# Patient Record
Sex: Male | Born: 1960
Health system: Southern US, Community
[De-identification: ages and names within clinical notes are randomized; demographics above are authoritative.]

## PROBLEM LIST (undated history)

## (undated) DIAGNOSIS — G8929 Other chronic pain: Secondary | ICD-10-CM

## (undated) DIAGNOSIS — M549 Dorsalgia, unspecified: Secondary | ICD-10-CM

## (undated) DIAGNOSIS — C649 Malignant neoplasm of unspecified kidney, except renal pelvis: Secondary | ICD-10-CM

## (undated) HISTORY — PX: HERNIA REPAIR: SHX51

## (undated) HISTORY — PX: NEPHRECTOMY: SHX65

## (undated) HISTORY — PX: HAND SURGERY: SHX662

---

## 2010-10-23 ENCOUNTER — Emergency Department (HOSPITAL_BASED_OUTPATIENT_CLINIC_OR_DEPARTMENT_OTHER)
Admission: EM | Admit: 2010-10-23 | Discharge: 2010-10-23 | Disposition: A | Discharge status: Home / Self Care | Payer: Managed Care, Other (non HMO) | Attending: Emergency Medicine | Admitting: Emergency Medicine

## 2010-10-23 ENCOUNTER — Emergency Department (INDEPENDENT_AMBULATORY_CARE_PROVIDER_SITE_OTHER): Payer: Managed Care, Other (non HMO)

## 2010-10-23 DIAGNOSIS — R1011 Right upper quadrant pain: Secondary | ICD-10-CM | POA: Insufficient documentation

## 2010-10-23 DIAGNOSIS — R109 Unspecified abdominal pain: Secondary | ICD-10-CM

## 2010-10-23 LAB — CBC
HCT: 40.2 % (ref 39.0–52.0)
Hemoglobin: 13.7 g/dL (ref 13.0–17.0)
MCH: 29.8 pg (ref 26.0–34.0)
MCHC: 34.1 g/dL (ref 30.0–36.0)
MCV: 87.4 fL (ref 78.0–100.0)
Platelets: 229 10*3/uL (ref 150–400)
RBC: 4.6 MIL/uL (ref 4.22–5.81)
RDW: 12.1 % (ref 11.5–15.5)
WBC: 7.7 10*3/uL (ref 4.0–10.5)

## 2010-10-23 LAB — DIFFERENTIAL
Basophils Absolute: 0 10*3/uL (ref 0.0–0.1)
Basophils Relative: 0 % (ref 0–1)
Eosinophils Absolute: 0.1 10*3/uL (ref 0.0–0.7)
Eosinophils Relative: 1 % (ref 0–5)
Lymphocytes Relative: 25 % (ref 12–46)
Lymphs Abs: 1.9 10*3/uL (ref 0.7–4.0)
Monocytes Absolute: 0.7 10*3/uL (ref 0.1–1.0)
Monocytes Relative: 9 % (ref 3–12)
Neutro Abs: 5 10*3/uL (ref 1.7–7.7)
Neutrophils Relative %: 65 % (ref 43–77)

## 2010-10-23 LAB — URINALYSIS, ROUTINE W REFLEX MICROSCOPIC
Bilirubin Urine: NEGATIVE
Hgb urine dipstick: NEGATIVE
Ketones, ur: NEGATIVE mg/dL
Nitrite: NEGATIVE
Protein, ur: NEGATIVE mg/dL
Specific Gravity, Urine: 1.027 (ref 1.005–1.030)
Urine Glucose, Fasting: NEGATIVE mg/dL
Urobilinogen, UA: 1 mg/dL (ref 0.0–1.0)
pH: 5.5 (ref 5.0–8.0)

## 2010-10-23 LAB — COMPREHENSIVE METABOLIC PANEL
ALT: 21 U/L (ref 0–53)
AST: 21 U/L (ref 0–37)
Albumin: 4.2 g/dL (ref 3.5–5.2)
Alkaline Phosphatase: 105 U/L (ref 39–117)
BUN: 16 mg/dL (ref 6–23)
CO2: 23 mEq/L (ref 19–32)
Calcium: 9.4 mg/dL (ref 8.4–10.5)
Chloride: 112 mEq/L (ref 96–112)
Creatinine, Ser: 1.3 mg/dL (ref 0.4–1.5)
GFR calc Af Amer: >60 mL/min (ref >60)
GFR calc non Af Amer: 58 mL/min — ABNORMAL LOW (ref >60)
Glucose, Bld: 91 mg/dL (ref 70–99)
Potassium: 4.8 mEq/L (ref 3.5–5.1)
Sodium: 145 mEq/L (ref 135–145)
Total Bilirubin: 0.8 mg/dL (ref 0.3–1.2)
Total Protein: 7.6 g/dL (ref 6.0–8.3)

## 2010-10-23 LAB — LIPASE, BLOOD: Lipase: 554 U/L — ABNORMAL HIGH (ref 23–300)

## 2010-10-23 MED ORDER — IOHEXOL 300 MG/ML  SOLN
100.0000 mL | Freq: Once | INTRAMUSCULAR | Status: AC | PRN
  Administered 2010-10-23: 100 mL via INTRAVENOUS

## 2010-10-25 LAB — URINE CULTURE
Colony Count: NO GROWTH
Culture  Setup Time: 201202122012
Culture: NO GROWTH

## 2010-12-17 ENCOUNTER — Emergency Department (HOSPITAL_BASED_OUTPATIENT_CLINIC_OR_DEPARTMENT_OTHER)
Admission: EM | Admit: 2010-12-17 | Discharge: 2010-12-17 | Disposition: A | Discharge status: Home / Self Care | Payer: Managed Care, Other (non HMO) | Attending: Emergency Medicine | Admitting: Emergency Medicine

## 2010-12-17 DIAGNOSIS — R51 Headache: Secondary | ICD-10-CM | POA: Insufficient documentation

## 2010-12-17 DIAGNOSIS — G8929 Other chronic pain: Secondary | ICD-10-CM | POA: Insufficient documentation

## 2010-12-24 ENCOUNTER — Emergency Department (HOSPITAL_COMMUNITY)
Admission: EM | Admit: 2010-12-24 | Discharge: 2010-12-24 | Disposition: A | Discharge status: Home / Self Care | Payer: Managed Care, Other (non HMO) | Attending: Emergency Medicine | Admitting: Emergency Medicine

## 2010-12-24 ENCOUNTER — Emergency Department (HOSPITAL_COMMUNITY): Payer: Managed Care, Other (non HMO)

## 2010-12-24 DIAGNOSIS — Z85528 Personal history of other malignant neoplasm of kidney: Secondary | ICD-10-CM | POA: Insufficient documentation

## 2010-12-24 DIAGNOSIS — R0989 Other specified symptoms and signs involving the circulatory and respiratory systems: Secondary | ICD-10-CM | POA: Insufficient documentation

## 2010-12-24 DIAGNOSIS — R071 Chest pain on breathing: Secondary | ICD-10-CM | POA: Insufficient documentation

## 2010-12-24 DIAGNOSIS — R0609 Other forms of dyspnea: Secondary | ICD-10-CM | POA: Insufficient documentation

## 2010-12-24 DIAGNOSIS — R05 Cough: Secondary | ICD-10-CM | POA: Insufficient documentation

## 2010-12-24 DIAGNOSIS — R51 Headache: Secondary | ICD-10-CM | POA: Insufficient documentation

## 2010-12-24 DIAGNOSIS — R059 Cough, unspecified: Secondary | ICD-10-CM | POA: Insufficient documentation

## 2010-12-24 LAB — BASIC METABOLIC PANEL
BUN: 17 mg/dL (ref 6–23)
CO2: 25 mEq/L (ref 19–32)
Calcium: 8.4 mg/dL (ref 8.4–10.5)
Chloride: 106 mEq/L (ref 96–112)
Creatinine, Ser: 1.24 mg/dL (ref 0.4–1.5)
GFR calc Af Amer: >60 mL/min (ref >60)
GFR calc non Af Amer: >60 mL/min (ref >60)
Glucose, Bld: 135 mg/dL — ABNORMAL HIGH (ref 70–99)
Potassium: 3.8 mEq/L (ref 3.5–5.1)
Sodium: 138 mEq/L (ref 135–145)

## 2010-12-24 LAB — CBC
HCT: 38.5 % — ABNORMAL LOW (ref 39.0–52.0)
Hemoglobin: 12.8 g/dL — ABNORMAL LOW (ref 13.0–17.0)
MCH: 30 pg (ref 26.0–34.0)
MCHC: 33.2 g/dL (ref 30.0–36.0)
MCV: 90.4 fL (ref 78.0–100.0)
Platelets: 209 10*3/uL (ref 150–400)
RBC: 4.26 MIL/uL (ref 4.22–5.81)
RDW: 12.9 % (ref 11.5–15.5)
WBC: 7.8 10*3/uL (ref 4.0–10.5)

## 2010-12-24 LAB — DIFFERENTIAL
Basophils Absolute: 0 10*3/uL (ref 0.0–0.1)
Basophils Relative: 0 % (ref 0–1)
Eosinophils Absolute: 0.2 10*3/uL (ref 0.0–0.7)
Eosinophils Relative: 2 % (ref 0–5)
Lymphocytes Relative: 34 % (ref 12–46)
Lymphs Abs: 2.7 10*3/uL (ref 0.7–4.0)
Monocytes Absolute: 0.7 10*3/uL (ref 0.1–1.0)
Monocytes Relative: 9 % (ref 3–12)
Neutro Abs: 4.2 10*3/uL (ref 1.7–7.7)
Neutrophils Relative %: 54 % (ref 43–77)

## 2010-12-24 LAB — POCT CARDIAC MARKERS
CKMB, poc: <1 ng/mL — ABNORMAL LOW (ref 1.0–8.0)
CKMB, poc: <1 ng/mL — ABNORMAL LOW (ref 1.0–8.0)
Myoglobin, poc: 49.3 ng/mL (ref 12–200)
Myoglobin, poc: 61.5 ng/mL (ref 12–200)
Troponin i, poc: <0.05 ng/mL (ref 0.00–0.09)
Troponin i, poc: <0.05 ng/mL (ref 0.00–0.09)

## 2010-12-24 LAB — D-DIMER, QUANTITATIVE: D-Dimer, Quant: 0.26 ug/mL-FEU (ref 0.00–0.48)

## 2011-01-21 ENCOUNTER — Emergency Department (HOSPITAL_BASED_OUTPATIENT_CLINIC_OR_DEPARTMENT_OTHER)
Admission: EM | Admit: 2011-01-21 | Discharge: 2011-01-21 | Disposition: A | Discharge status: Home / Self Care | Payer: Managed Care, Other (non HMO) | Attending: Emergency Medicine | Admitting: Emergency Medicine

## 2011-01-21 ENCOUNTER — Emergency Department (INDEPENDENT_AMBULATORY_CARE_PROVIDER_SITE_OTHER): Payer: Managed Care, Other (non HMO)

## 2011-01-21 DIAGNOSIS — F17200 Nicotine dependence, unspecified, uncomplicated: Secondary | ICD-10-CM | POA: Insufficient documentation

## 2011-01-21 DIAGNOSIS — M25529 Pain in unspecified elbow: Secondary | ICD-10-CM

## 2011-01-21 DIAGNOSIS — M25469 Effusion, unspecified knee: Secondary | ICD-10-CM | POA: Insufficient documentation

## 2011-01-21 DIAGNOSIS — M25429 Effusion, unspecified elbow: Secondary | ICD-10-CM

## 2011-01-21 DIAGNOSIS — Z85528 Personal history of other malignant neoplasm of kidney: Secondary | ICD-10-CM

## 2011-02-26 ENCOUNTER — Emergency Department (HOSPITAL_BASED_OUTPATIENT_CLINIC_OR_DEPARTMENT_OTHER)
Admission: EM | Admit: 2011-02-26 | Discharge: 2011-02-26 | Disposition: A | Discharge status: Home / Self Care | Payer: Managed Care, Other (non HMO) | Attending: Emergency Medicine | Admitting: Emergency Medicine

## 2011-02-26 DIAGNOSIS — F17200 Nicotine dependence, unspecified, uncomplicated: Secondary | ICD-10-CM | POA: Insufficient documentation

## 2011-02-26 DIAGNOSIS — M6580 Other synovitis and tenosynovitis, unspecified site: Secondary | ICD-10-CM | POA: Insufficient documentation

## 2011-02-26 DIAGNOSIS — M25529 Pain in unspecified elbow: Secondary | ICD-10-CM | POA: Insufficient documentation

## 2011-03-26 ENCOUNTER — Encounter: Payer: Self-pay | Admitting: *Deleted

## 2011-03-26 ENCOUNTER — Emergency Department (HOSPITAL_BASED_OUTPATIENT_CLINIC_OR_DEPARTMENT_OTHER)
Admission: EM | Admit: 2011-03-26 | Discharge: 2011-03-26 | Disposition: A | Discharge status: Home / Self Care | Payer: Worker's Compensation | Attending: Emergency Medicine | Admitting: Emergency Medicine

## 2011-03-26 DIAGNOSIS — M545 Low back pain, unspecified: Secondary | ICD-10-CM | POA: Insufficient documentation

## 2011-03-26 DIAGNOSIS — F17200 Nicotine dependence, unspecified, uncomplicated: Secondary | ICD-10-CM | POA: Insufficient documentation

## 2011-03-26 DIAGNOSIS — X500XXA Overexertion from strenuous movement or load, initial encounter: Secondary | ICD-10-CM | POA: Insufficient documentation

## 2011-03-26 DIAGNOSIS — S39012A Strain of muscle, fascia and tendon of lower back, initial encounter: Secondary | ICD-10-CM

## 2011-03-26 DIAGNOSIS — R269 Unspecified abnormalities of gait and mobility: Secondary | ICD-10-CM | POA: Insufficient documentation

## 2011-03-26 DIAGNOSIS — Y990 Civilian activity done for income or pay: Secondary | ICD-10-CM | POA: Insufficient documentation

## 2011-03-26 DIAGNOSIS — Z79899 Other long term (current) drug therapy: Secondary | ICD-10-CM | POA: Insufficient documentation

## 2011-03-26 DIAGNOSIS — S335XXA Sprain of ligaments of lumbar spine, initial encounter: Secondary | ICD-10-CM | POA: Insufficient documentation

## 2011-03-26 HISTORY — DX: Malignant neoplasm of unspecified kidney, except renal pelvis: C64.9

## 2011-03-26 MED ORDER — OXYCODONE-ACETAMINOPHEN 5-325 MG PO TABS
2.0000 | ORAL_TABLET | Freq: Once | ORAL | Status: AC
  Administered 2011-03-26: 2 via ORAL

## 2011-03-26 MED ORDER — CYCLOBENZAPRINE HCL 10 MG PO TABS
10.0000 mg | ORAL_TABLET | Freq: Once | ORAL | Status: AC
  Administered 2011-03-26: 10 mg via ORAL

## 2011-03-26 MED ORDER — OXYCODONE-ACETAMINOPHEN 5-325 MG PO TABS
ORAL_TABLET | ORAL | Status: AC
  Filled 2011-03-26: qty 1

## 2011-03-26 MED ORDER — CYCLOBENZAPRINE HCL 10 MG PO TABS: 10.0000 mg | ORAL_TABLET | Freq: Two times a day (BID) | ORAL | Status: AC | PRN

## 2011-03-26 MED ORDER — OXYCODONE-ACETAMINOPHEN 5-325 MG PO TABS: 1.0000 | ORAL_TABLET | ORAL | Status: AC | PRN

## 2011-03-26 MED ORDER — CYCLOBENZAPRINE HCL 10 MG PO TABS
ORAL_TABLET | ORAL | Status: AC
  Filled 2011-03-26: qty 1

## 2011-03-26 MED ORDER — IBUPROFEN 600 MG PO TABS: 600.0000 mg | ORAL_TABLET | Freq: Four times a day (QID) | ORAL | Status: AC | PRN

## 2011-03-26 NOTE — ED Provider Notes (Signed)
History     Chief Complaint  Patient presents with  . Back Pain   HPI Comments: [facility] and [facility] and has been taking oxycodone for this. [facility] in [facility] one week for recheck.  Patient is a 50 y.o. male presenting with back pain. The history is provided by the patient.  Back Pain  This is a recurrent problem. 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 The problem occurs constantly. The problem has not changed since onset.Associated with: Heavy lifting at work. The pain is present in the lumbar spine. The quality of the pain is described as stabbing. The pain does not radiate. The pain is moderate. The symptoms are aggravated by bending and certain positions. The pain is the same all the time. Pertinent negatives include no chest pain, no fever, no numbness, no headaches, no bladder incontinence, no dysuria, no paresthesias, no paresis, no tingling and no weakness. He has tried nothing for the symptoms.    Past Medical History  Diagnosis Date  . Kidney carcinoma     Past Surgical History  Procedure Date  . Nephrectomy   . Hernia repair     History reviewed. No pertinent family history.  History  Substance Use Topics  . Smoking status: Current Some Day Smoker -- 0.5 packs/day  . Smokeless tobacco: Not on file  . Alcohol Use: No      Review of Systems  Constitutional: Negative for fever and chills.  HENT: Negative for neck pain.   Cardiovascular: Negative for chest pain.  Gastrointestinal: Negative for nausea, vomiting and diarrhea.  Genitourinary: Negative for bladder incontinence, dysuria and difficulty urinating.  Musculoskeletal: Positive for back pain.  Skin: Negative for rash.  Neurological: Negative for tingling, weakness, numbness, headaches and paresthesias.    Physical Exam  BP 128/78  Pulse 90   Temp(Src) 97.4 F (36.3 C) (Oral)  Resp 16  SpO2 100%  Physical Exam  Constitutional: He appears well-developed and well-nourished.  HENT:  Head: Normocephalic and atraumatic.  Eyes: Conjunctivae are normal. No scleral icterus.  Cardiovascular: Normal rate, regular rhythm and normal heart sounds.   No murmur heard. Pulmonary/Chest: Effort normal and breath sounds normal. No respiratory distress.  Abdominal: Soft.  Musculoskeletal: Normal range of motion. He exhibits no edema and no tenderness.       Mild central lumbar pain, no sacral pain, no thoracic pain.  Neurological: He is alert.       Normal strength and sensation of the bilateral lower extremities. Gait antalgic due to back pain.  Skin: Skin is warm and dry. No rash noted. He is not diaphoretic.    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 We'll give pain medication here and refer back to his primary care doctor for ongoing symptoms and treatment. He is neurologically intact at this time.      Vida Roller, MD 03/26/11 [phone]

## 2011-03-26 NOTE — ED Notes (Signed)
Pt c/o lower back pain x 2 weeks 

## 2011-03-27 ENCOUNTER — Encounter (HOSPITAL_BASED_OUTPATIENT_CLINIC_OR_DEPARTMENT_OTHER): Payer: Self-pay | Admitting: *Deleted

## 2011-03-27 ENCOUNTER — Emergency Department (HOSPITAL_BASED_OUTPATIENT_CLINIC_OR_DEPARTMENT_OTHER)
Admission: EM | Admit: 2011-03-27 | Discharge: 2011-03-27 | Disposition: A | Discharge status: Home / Self Care | Payer: Managed Care, Other (non HMO) | Attending: Emergency Medicine | Admitting: Emergency Medicine

## 2011-03-27 DIAGNOSIS — M549 Dorsalgia, unspecified: Secondary | ICD-10-CM

## 2011-03-27 NOTE — ED Provider Notes (Signed)
History     Chief Complaint  Patient presents with  . Back Pain   HPI Comments: Pt had hurt back several days ago - by lifting heavy objects - has improved with percocet and motrin and now only feels the pain when he has been sitting for a prolonged period of time and then tries to get up - it gets better with mvoement and loosening up.  Denies neuro or urinary sx.  States he is ready to go back to work.  Patient is a 50 y.o. male presenting with back pain. The history is provided by the patient and medical records.  Back Pain  Pertinent negatives include no fever.    Past Medical History  Diagnosis Date  . Kidney carcinoma     Past Surgical History  Procedure Date  . Nephrectomy   . Hernia repair     No family history on file.  History  Substance Use Topics  . Smoking status: Current Some Day Smoker -- 0.5 packs/day  . Smokeless tobacco: Not on file  . Alcohol Use: No      Review of Systems  Constitutional: Negative for fever.  Gastrointestinal: Negative for nausea and vomiting.  Musculoskeletal: Positive for back pain.    Physical Exam  BP 143/86  Pulse 66  Temp(Src) 98 F (36.7 C) (Oral)  Resp 20  Ht 5\' 9"  (1.753 m)  Wt 177 lb (80.287 kg)  BMI 26.14 kg/m2  SpO2 100%  Physical Exam  Constitutional: He appears well-developed and well-nourished. No distress.  HENT:  Head: Normocephalic and atraumatic.  Eyes: Conjunctivae are normal. No scleral icterus.  Musculoskeletal:       Entire spine non tender  Neurological:       Gait normal, motor and sensory normal  Skin: Skin is warm and dry. No rash noted. He is not diaphoretic.    ED Course  Procedures  MDM Well appaering, improving - will send back to work.      Vida Roller, MD 03/27/11 [phone]

## 2011-03-27 NOTE — ED Notes (Signed)
Pt requesting a note to return to work tomorrow and a non-narcotic pain medication.

## 2011-03-27 NOTE — ED Notes (Addendum)
Pt says that he was injured at work on 01/24/11, having pain in his back. Evaluated here for the same. Pt says his pain is not much better, would like rx for non narcotic medication and a release note back to work

## 2011-04-04 ENCOUNTER — Emergency Department (HOSPITAL_COMMUNITY): Payer: Managed Care, Other (non HMO)

## 2011-04-04 ENCOUNTER — Emergency Department (HOSPITAL_COMMUNITY)
Admission: EM | Admit: 2011-04-04 | Discharge: 2011-04-04 | Disposition: A | Discharge status: Home / Self Care | Payer: Managed Care, Other (non HMO) | Attending: Emergency Medicine | Admitting: Emergency Medicine

## 2011-04-04 DIAGNOSIS — X500XXA Overexertion from strenuous movement or load, initial encounter: Secondary | ICD-10-CM | POA: Insufficient documentation

## 2011-04-04 DIAGNOSIS — N289 Disorder of kidney and ureter, unspecified: Secondary | ICD-10-CM | POA: Insufficient documentation

## 2011-04-04 DIAGNOSIS — IMO0002 Reserved for concepts with insufficient information to code with codable children: Secondary | ICD-10-CM | POA: Insufficient documentation

## 2011-04-04 DIAGNOSIS — M549 Dorsalgia, unspecified: Secondary | ICD-10-CM | POA: Insufficient documentation

## 2011-04-04 LAB — CBC
HCT: 43.2 % (ref 39.0–52.0)
Hemoglobin: 14.9 g/dL (ref 13.0–17.0)
MCH: 30.9 pg (ref 26.0–34.0)
MCHC: 34.5 g/dL (ref 30.0–36.0)
MCV: 89.6 fL (ref 78.0–100.0)
Platelets: 228 10*3/uL (ref 150–400)
RBC: 4.82 MIL/uL (ref 4.22–5.81)
RDW: 12.6 % (ref 11.5–15.5)
WBC: 9.7 10*3/uL (ref 4.0–10.5)

## 2011-04-04 LAB — DIFFERENTIAL
Basophils Absolute: 0 10*3/uL (ref 0.0–0.1)
Basophils Relative: 0 % (ref 0–1)
Eosinophils Absolute: 0.2 10*3/uL (ref 0.0–0.7)
Eosinophils Relative: 2 % (ref 0–5)
Lymphocytes Relative: 22 % (ref 12–46)
Lymphs Abs: 2.1 10*3/uL (ref 0.7–4.0)
Monocytes Absolute: 0.7 10*3/uL (ref 0.1–1.0)
Monocytes Relative: 7 % (ref 3–12)
Neutro Abs: 6.7 10*3/uL (ref 1.7–7.7)
Neutrophils Relative %: 69 % (ref 43–77)

## 2011-04-04 LAB — COMPREHENSIVE METABOLIC PANEL
ALT: 26 U/L (ref 0–53)
AST: 29 U/L (ref 0–37)
Albumin: 4.6 g/dL (ref 3.5–5.2)
Alkaline Phosphatase: 115 U/L (ref 39–117)
BUN: 17 mg/dL (ref 6–23)
CO2: 27 mEq/L (ref 19–32)
Calcium: 10 mg/dL (ref 8.4–10.5)
Chloride: 102 mEq/L (ref 96–112)
Creatinine, Ser: 1.5 mg/dL — ABNORMAL HIGH (ref 0.50–1.35)
GFR calc Af Amer: 60 mL/min — ABNORMAL LOW (ref >60)
GFR calc non Af Amer: 50 mL/min — ABNORMAL LOW (ref >60)
Glucose, Bld: 131 mg/dL — ABNORMAL HIGH (ref 70–99)
Potassium: 3.9 mEq/L (ref 3.5–5.1)
Sodium: 138 mEq/L (ref 135–145)
Total Bilirubin: 0.3 mg/dL (ref 0.3–1.2)
Total Protein: 8.3 g/dL (ref 6.0–8.3)

## 2011-04-04 LAB — URINALYSIS, ROUTINE W REFLEX MICROSCOPIC
Glucose, UA: NEGATIVE mg/dL
Hgb urine dipstick: NEGATIVE
Ketones, ur: NEGATIVE mg/dL
Leukocytes, UA: NEGATIVE
Nitrite: NEGATIVE
Protein, ur: NEGATIVE mg/dL
Specific Gravity, Urine: 1.04 — ABNORMAL HIGH (ref 1.005–1.030)
Urobilinogen, UA: 1 mg/dL (ref 0.0–1.0)
pH: 5.5 (ref 5.0–8.0)

## 2011-04-18 ENCOUNTER — Emergency Department (INDEPENDENT_AMBULATORY_CARE_PROVIDER_SITE_OTHER): Payer: Managed Care, Other (non HMO)

## 2011-04-18 ENCOUNTER — Encounter (HOSPITAL_BASED_OUTPATIENT_CLINIC_OR_DEPARTMENT_OTHER): Payer: Self-pay | Admitting: *Deleted

## 2011-04-18 ENCOUNTER — Emergency Department (HOSPITAL_BASED_OUTPATIENT_CLINIC_OR_DEPARTMENT_OTHER)
Admission: EM | Admit: 2011-04-18 | Discharge: 2011-04-18 | Disposition: A | Discharge status: Home / Self Care | Payer: Managed Care, Other (non HMO) | Attending: Emergency Medicine | Admitting: Emergency Medicine

## 2011-04-18 DIAGNOSIS — N289 Disorder of kidney and ureter, unspecified: Secondary | ICD-10-CM | POA: Insufficient documentation

## 2011-04-18 DIAGNOSIS — R1031 Right lower quadrant pain: Secondary | ICD-10-CM

## 2011-04-18 DIAGNOSIS — R109 Unspecified abdominal pain: Secondary | ICD-10-CM

## 2011-04-18 DIAGNOSIS — Z905 Acquired absence of kidney: Secondary | ICD-10-CM

## 2011-04-18 DIAGNOSIS — M545 Low back pain, unspecified: Secondary | ICD-10-CM | POA: Insufficient documentation

## 2011-04-18 LAB — COMPREHENSIVE METABOLIC PANEL
ALT: 14 U/L (ref 0–53)
AST: 20 U/L (ref 0–37)
Albumin: 4.3 g/dL (ref 3.5–5.2)
Alkaline Phosphatase: 118 U/L — ABNORMAL HIGH (ref 39–117)
BUN: 25 mg/dL — ABNORMAL HIGH (ref 6–23)
CO2: 24 mEq/L (ref 19–32)
Calcium: 10 mg/dL (ref 8.4–10.5)
Chloride: 102 mEq/L (ref 96–112)
Creatinine, Ser: 1.4 mg/dL — ABNORMAL HIGH (ref 0.50–1.35)
GFR calc Af Amer: >60 mL/min (ref >60)
GFR calc non Af Amer: 54 mL/min — ABNORMAL LOW (ref >60)
Glucose, Bld: 101 mg/dL — ABNORMAL HIGH (ref 70–99)
Potassium: 4.1 mEq/L (ref 3.5–5.1)
Sodium: 139 mEq/L (ref 135–145)
Total Bilirubin: 0.2 mg/dL — ABNORMAL LOW (ref 0.3–1.2)
Total Protein: 8 g/dL (ref 6.0–8.3)

## 2011-04-18 LAB — CBC
HCT: 43.4 % (ref 39.0–52.0)
Hemoglobin: 14.8 g/dL (ref 13.0–17.0)
MCH: 30.3 pg (ref 26.0–34.0)
MCHC: 34.1 g/dL (ref 30.0–36.0)
MCV: 88.8 fL (ref 78.0–100.0)
Platelets: 231 10*3/uL (ref 150–400)
RBC: 4.89 MIL/uL (ref 4.22–5.81)
RDW: 12.5 % (ref 11.5–15.5)
WBC: 9.4 10*3/uL (ref 4.0–10.5)

## 2011-04-18 LAB — DIFFERENTIAL
Basophils Absolute: 0 10*3/uL (ref 0.0–0.1)
Basophils Relative: 0 % (ref 0–1)
Eosinophils Absolute: 0.2 10*3/uL (ref 0.0–0.7)
Eosinophils Relative: 2 % (ref 0–5)
Lymphocytes Relative: 29 % (ref 12–46)
Lymphs Abs: 2.7 10*3/uL (ref 0.7–4.0)
Monocytes Absolute: 0.7 10*3/uL (ref 0.1–1.0)
Monocytes Relative: 8 % (ref 3–12)
Neutro Abs: 5.8 10*3/uL (ref 1.7–7.7)
Neutrophils Relative %: 62 % (ref 43–77)

## 2011-04-18 LAB — URINALYSIS, ROUTINE W REFLEX MICROSCOPIC
Bilirubin Urine: NEGATIVE
Glucose, UA: NEGATIVE mg/dL
Hgb urine dipstick: NEGATIVE
Ketones, ur: NEGATIVE mg/dL
Leukocytes, UA: NEGATIVE
Nitrite: NEGATIVE
Protein, ur: NEGATIVE mg/dL
Specific Gravity, Urine: 1.027 (ref 1.005–1.030)
Urobilinogen, UA: 0.2 mg/dL (ref 0.0–1.0)
pH: 5.5 (ref 5.0–8.0)

## 2011-04-18 LAB — LIPASE, BLOOD: Lipase: 64 U/L — ABNORMAL HIGH (ref 11–59)

## 2011-04-18 MED ORDER — HYDROMORPHONE HCL 2 MG PO TABS: 2.0000 mg | ORAL_TABLET | ORAL | Status: AC | PRN

## 2011-04-18 MED ORDER — HYDROMORPHONE HCL 1 MG/ML IJ SOLN
1.0000 mg | Freq: Once | INTRAMUSCULAR | Status: AC
  Administered 2011-04-18: 1 mg via INTRAVENOUS
  Filled 2011-04-18: qty 1

## 2011-04-18 MED ORDER — ONDANSETRON HCL 4 MG/2ML IJ SOLN
4.0000 mg | Freq: Once | INTRAMUSCULAR | Status: AC
  Administered 2011-04-18: 4 mg via INTRAVENOUS
  Filled 2011-04-18: qty 2

## 2011-04-18 MED ORDER — SODIUM CHLORIDE 0.9 % IV SOLN
999.0000 mL | INTRAVENOUS | Status: DC
  Administered 2011-04-18: 999 mL via INTRAVENOUS

## 2011-04-18 NOTE — ED Notes (Signed)
Left flank pain has ct appt 17th states is in severe pain unable to wait that long has had right kidney removed due to cancer previously

## 2011-04-18 NOTE — ED Provider Notes (Addendum)
History     CSN: [id number] Arrival date & time: 04/18/2011  5:07 PM  Chief Complaint  Patient presents with  . Flank Pain   Patient is a 50 y.o. male presenting with flank pain.  Flank Pain This is a new problem. The current episode started more than 1 week ago (he has been having pain in his right flank and RLQ for over one month). The problem has been gradually worsening (pain is currently 8/10, but has been as severe as 10/10). Associated symptoms comments: There is associated nausea and anorexia as well as intermittent dysuria. No fever, chills, sweats. No constipation or diarrhea. Symptoms are described as severe. Nothing makes it better, nothing makes it worse.. Treatments tried: He has taken Hydrocodone-Acetaminophen with no relief.    Past Medical History  Diagnosis Date  . Kidney carcinoma     Past Surgical History  Procedure Date  . Nephrectomy   . Hernia repair     History reviewed. No pertinent family history.  History  Substance Use Topics  . Smoking status: Current Some Day Smoker -- 0.5 packs/day  . Smokeless tobacco: Not on file  . Alcohol Use: No      Review of Systems  Genitourinary: Positive for flank pain.  All other systems reviewed and are negative.    Physical Exam  BP 132/78  Pulse 63  Temp(Src) 97.9 F (36.6 C) (Oral)  Resp 20  SpO2 100%  Physical Exam  Nursing note and vitals reviewed. Constitutional: He is oriented to person, place, and time. He appears well-developed and well-nourished. He appears distressed.       In moderate pain.  HENT:  Head: Normocephalic and atraumatic.  Right Ear: External ear normal.  Left Ear: External ear normal.  Nose: Nose normal.  Mouth/Throat: Oropharynx is clear and moist.  Eyes: Conjunctivae and EOM are normal. Pupils are equal, round, and reactive to light. No scleral icterus.  Neck: Normal range of motion. Neck supple. No JVD present.  Cardiovascular: Normal rate, regular rhythm, normal heart  sounds and intact distal pulses.   No murmur heard. Pulmonary/Chest: Effort normal and breath sounds normal. He has no wheezes. He has no rales.  Abdominal: Soft. He exhibits no mass. There is tenderness.       Moderate right CVA and RLQ tenderness. Peristalsis is diminished.  Musculoskeletal: Normal range of motion. He exhibits no edema.  Lymphadenopathy:    He has no cervical adenopathy.  Neurological: He is alert and oriented to person, place, and time. Coordination normal.  Skin: Skin is warm and dry. No rash noted.  Psychiatric: He has a normal mood and affect.    ED Course  Procedures  MDM Right flank pain in patient who has had a prior right nephrectomy. Prior ED visits reviewed - visits for musculoskeletal back pain.  Good pain relief from IV Dilaudid. Labs and x-rays reviewed, images viewed by me. No clear causes for pain. Degree of renal insufficiency in line with patient with one kidney. Results for orders placed during the hospital encounter of 04/18/11  URINALYSIS, ROUTINE W REFLEX MICROSCOPIC      Component Value Range   Color, Urine YELLOW  YELLOW    Appearance CLEAR  CLEAR    Specific Gravity, Urine 1.027  1.005 - 1.030    pH 5.5  5.0 - 8.0    Glucose, UA NEGATIVE  NEGATIVE (mg/dL)   Hgb urine dipstick NEGATIVE  NEGATIVE    Bilirubin Urine NEGATIVE  NEGATIVE  Ketones, ur NEGATIVE  NEGATIVE (mg/dL)   Protein, ur NEGATIVE  NEGATIVE (mg/dL)   Urobilinogen, UA 0.2  0.0 - 1.0 (mg/dL)   Nitrite NEGATIVE  NEGATIVE    Leukocytes, UA NEGATIVE  NEGATIVE   CBC      Component Value Range   WBC 9.4  4.0 - 10.5 (K/uL)   RBC 4.89  4.22 - 5.81 (MIL/uL)   Hemoglobin 14.8  13.0 - 17.0 (g/dL)   HCT 16.1  09.6 - 04.5 (%)   MCV 88.8  78.0 - 100.0 (fL)   MCH 30.3  26.0 - 34.0 (pg)   MCHC 34.1  30.0 - 36.0 (g/dL)   RDW 40.9  81.1 - 91.4 (%)   Platelets 231  150 - 400 (K/uL)  DIFFERENTIAL      Component Value Range   Neutrophils Relative 62  43 - 77 (%)   Neutro Abs 5.8   1.7 - 7.7 (K/uL)   Lymphocytes Relative 29  12 - 46 (%)   Lymphs Abs 2.7  0.7 - 4.0 (K/uL)   Monocytes Relative 8  3 - 12 (%)   Monocytes Absolute 0.7  0.1 - 1.0 (K/uL)   Eosinophils Relative 2  0 - 5 (%)   Eosinophils Absolute 0.2  0.0 - 0.7 (K/uL)   Basophils Relative 0  0 - 1 (%)   Basophils Absolute 0.0  0.0 - 0.1 (K/uL)  COMPREHENSIVE METABOLIC PANEL      Component Value Range   Sodium 139  135 - 145 (mEq/L)   Potassium 4.1  3.5 - 5.1 (mEq/L)   Chloride 102  96 - 112 (mEq/L)   CO2 24  19 - 32 (mEq/L)   Glucose, Bld 101 (*) 70 - 99 (mg/dL)   BUN 25 (*) 6 - 23 (mg/dL)   Creatinine, Ser 7.82 (*) 0.50 - 1.35 (mg/dL)   Calcium 95.6  8.4 - 10.5 (mg/dL)   Total Protein 8.0  6.0 - 8.3 (g/dL)   Albumin 4.3  3.5 - 5.2 (g/dL)   AST 20  0 - 37 (U/L)   ALT 14  0 - 53 (U/L)   Alkaline Phosphatase 118 (*) 39 - 117 (U/L)   Total Bilirubin 0.2 (*) 0.3 - 1.2 (mg/dL)   GFR calc non Af Amer 54 (*) >60 (mL/min)   GFR calc Af Amer >60  >60 (mL/min)  LIPASE, BLOOD      Component Value Range   Lipase 64 (*) 11 - 59 (U/L)   Ct Abdomen Pelvis Wo Contrast  04/18/2011  *RADIOLOGY REPORT*  Clinical Data: Right lower quadrant/right flank pain, status post right nephrectomy  CT ABDOMEN AND PELVIS WITHOUT CONTRAST  Technique:  Multidetector CT imaging of the abdomen and pelvis was performed following the standard protocol without intravenous contrast.  Comparison: CT dated 10/23/2010  Findings: Lung bases are clear.  Unenhanced liver, spleen, and adrenal glands are within normal limits.  Pancreas is unremarkable, without convincing inflammatory changes.  Gallbladder is unremarkable.  No intrahepatic or extrahepatic ductal dilatation.  Status post right nephrectomy.  No abnormal soft tissue in the surgical bed.  The kidney is unremarkable.  No renal calculi or hydronephrosis.  No evidence of bowel obstruction.  Normal appendix.  No evidence of abdominal aortic aneurysm.  No abdominopelvic ascites.  2.0 x 1.4 cm  celiac axis lymph node (series 2/image 16), unlikely to be related to patient's prior renal malignancy.  Otherwise, no suspicious abdominopelvic lymphadenopathy.  Prostate is unremarkable.  No left ureteral or bladder  calculi.  Bladder is underdistended but unremarkable.  Degenerative changes of the visualized thoracolumbar spine.  No focal osseous lesions.  IMPRESSION: No acute findings in the abdomen/pelvis.  Status post right nephrectomy.  No evidence of recurrent or metastatic disease.  Original Report Authenticated By: Charline Bills, M.D.   Dg Lumbar Spine Complete  04/04/2011  *RADIOLOGY REPORT*  Clinical Data: Low back pain  LUMBAR SPINE - COMPLETE 4+ VIEW  Comparison: 10/23/2010  Findings: Sclerotic change in the right femoral head with a stable small lucent   subchondral lesion. There is no evidence of lumbar spine fracture.  Alignment is normal.  Intervertebral disc spaces are maintained.  IMPRESSION: 1.  Negative lumbar spine.  Original Report Authenticated By: Osa Craver, M.D.      Dione Booze, MD 04/18/11 Ebony Cargo  Dione Booze, MD 04/18/11 [phone]

## 2011-06-30 ENCOUNTER — Emergency Department (INDEPENDENT_AMBULATORY_CARE_PROVIDER_SITE_OTHER): Payer: Managed Care, Other (non HMO)

## 2011-06-30 ENCOUNTER — Emergency Department (HOSPITAL_BASED_OUTPATIENT_CLINIC_OR_DEPARTMENT_OTHER)
Admission: EM | Admit: 2011-06-30 | Discharge: 2011-06-30 | Disposition: A | Discharge status: Home / Self Care | Payer: Managed Care, Other (non HMO) | Attending: Emergency Medicine | Admitting: Emergency Medicine

## 2011-06-30 ENCOUNTER — Encounter (HOSPITAL_BASED_OUTPATIENT_CLINIC_OR_DEPARTMENT_OTHER): Payer: Self-pay | Admitting: *Deleted

## 2011-06-30 DIAGNOSIS — R1031 Right lower quadrant pain: Secondary | ICD-10-CM

## 2011-06-30 DIAGNOSIS — R197 Diarrhea, unspecified: Secondary | ICD-10-CM | POA: Insufficient documentation

## 2011-06-30 DIAGNOSIS — F17200 Nicotine dependence, unspecified, uncomplicated: Secondary | ICD-10-CM | POA: Insufficient documentation

## 2011-06-30 DIAGNOSIS — R109 Unspecified abdominal pain: Secondary | ICD-10-CM | POA: Insufficient documentation

## 2011-06-30 LAB — COMPREHENSIVE METABOLIC PANEL
ALT: 14 U/L (ref 0–53)
AST: 23 U/L (ref 0–37)
Albumin: 4.4 g/dL (ref 3.5–5.2)
Alkaline Phosphatase: 111 U/L (ref 39–117)
BUN: 16 mg/dL (ref 6–23)
CO2: 23 mEq/L (ref 19–32)
Calcium: 10 mg/dL (ref 8.4–10.5)
Chloride: 103 mEq/L (ref 96–112)
Creatinine, Ser: 1.1 mg/dL (ref 0.50–1.35)
GFR calc Af Amer: 89 mL/min — ABNORMAL LOW (ref >90)
GFR calc non Af Amer: 77 mL/min — ABNORMAL LOW (ref >90)
Glucose, Bld: 89 mg/dL (ref 70–99)
Potassium: 4.5 mEq/L (ref 3.5–5.1)
Sodium: 137 mEq/L (ref 135–145)
Total Bilirubin: 0.6 mg/dL (ref 0.3–1.2)
Total Protein: 7.9 g/dL (ref 6.0–8.3)

## 2011-06-30 LAB — CBC
HCT: 43.8 % (ref 39.0–52.0)
Hemoglobin: 14.9 g/dL (ref 13.0–17.0)
MCH: 29.6 pg (ref 26.0–34.0)
MCHC: 34 g/dL (ref 30.0–36.0)
MCV: 86.9 fL (ref 78.0–100.0)
Platelets: 247 10*3/uL (ref 150–400)
RBC: 5.04 MIL/uL (ref 4.22–5.81)
RDW: 12.4 % (ref 11.5–15.5)
WBC: 10.5 10*3/uL (ref 4.0–10.5)

## 2011-06-30 LAB — DIFFERENTIAL
Basophils Absolute: 0 10*3/uL (ref 0.0–0.1)
Basophils Relative: 0 % (ref 0–1)
Eosinophils Absolute: 0.1 10*3/uL (ref 0.0–0.7)
Eosinophils Relative: 1 % (ref 0–5)
Lymphocytes Relative: 20 % (ref 12–46)
Lymphs Abs: 2.1 10*3/uL (ref 0.7–4.0)
Monocytes Absolute: 0.8 10*3/uL (ref 0.1–1.0)
Monocytes Relative: 7 % (ref 3–12)
Neutro Abs: 7.6 10*3/uL (ref 1.7–7.7)
Neutrophils Relative %: 72 % (ref 43–77)

## 2011-06-30 LAB — URINALYSIS, ROUTINE W REFLEX MICROSCOPIC
Bilirubin Urine: NEGATIVE
Glucose, UA: NEGATIVE mg/dL
Hgb urine dipstick: NEGATIVE
Ketones, ur: 15 mg/dL — AB
Leukocytes, UA: NEGATIVE
Nitrite: NEGATIVE
Protein, ur: NEGATIVE mg/dL
Specific Gravity, Urine: 1.019 (ref 1.005–1.030)
Urobilinogen, UA: 0.2 mg/dL (ref 0.0–1.0)
pH: 6.5 (ref 5.0–8.0)

## 2011-06-30 LAB — LIPASE, BLOOD: Lipase: 88 U/L — ABNORMAL HIGH (ref 11–59)

## 2011-06-30 MED ORDER — HYDROCODONE-ACETAMINOPHEN 5-325 MG PO TABS: ORAL_TABLET | ORAL | Status: DC

## 2011-06-30 MED ORDER — ONDANSETRON HCL 4 MG/2ML IJ SOLN
4.0000 mg | Freq: Once | INTRAMUSCULAR | Status: AC
  Administered 2011-06-30: 4 mg via INTRAVENOUS
  Filled 2011-06-30: qty 2

## 2011-06-30 MED ORDER — IOHEXOL 300 MG/ML  SOLN
100.0000 mL | Freq: Once | INTRAMUSCULAR | Status: AC | PRN
  Administered 2011-06-30: 100 mL via INTRAVENOUS

## 2011-06-30 MED ORDER — HYDROMORPHONE HCL 1 MG/ML IJ SOLN
1.0000 mg | Freq: Once | INTRAMUSCULAR | Status: AC
  Administered 2011-06-30: 1 mg via INTRAVENOUS
  Filled 2011-06-30: qty 1

## 2011-06-30 MED ORDER — SODIUM CHLORIDE 0.9 % IV SOLN
Freq: Once | INTRAVENOUS | Status: AC
  Administered 2011-06-30: 13:00:00 via INTRAVENOUS

## 2011-06-30 NOTE — ED Notes (Signed)
Severe right lower quadrant abdominal pain onsset 2 days ago but worsening today pt tearful in triage scar noted on mid abdomen states he had kidney cancer surgery and has one kidney surgery 2 yrs ago

## 2011-06-30 NOTE — ED Provider Notes (Signed)
History     CSN: [id number] Arrival date & time: 06/30/2011 11:49 AM   First MD Initiated Contact with Patient 06/30/11 1211      Chief Complaint  Patient presents with  . Abdominal Pain  . Diarrhea    (Consider location/radiation/quality/duration/timing/severity/associated sxs/prior treatment) HPI Comments: Pt has had 2-3 episodes of loose/watery diarrhea each day x 2 days.  Decreased appetite.    Only abdominal surgery was R nephrectomy.  Patient is a 50 y.o. male presenting with abdominal pain and diarrhea. The history is provided by the patient and the spouse. No language interpreter was used.  Abdominal Pain The primary symptoms of the illness include abdominal pain and diarrhea. The primary symptoms of the illness do not include fever, nausea, vomiting or hematochezia. The current episode started 2 days ago. The onset of the illness was sudden. The problem has been gradually worsening.  The pain came on suddenly. The abdominal pain is located in the RLQ. The severity of the abdominal pain is 8/10. The abdominal pain is relieved by nothing. The abdominal pain is exacerbated by eating and movement.  The patient states that she believes she is currently not pregnant. The patient has not had a change in bowel habit. Risk factors for an acute abdominal problem include a history of abdominal surgery. Symptoms associated with the illness do not include chills or diaphoresis.  Diarrhea The primary symptoms include abdominal pain and diarrhea. Primary symptoms do not include fever, nausea, vomiting or hematochezia.  The illness does not include chills.    Past Medical History  Diagnosis Date  . Kidney carcinoma     Past Surgical History  Procedure Date  . Nephrectomy   . Hernia repair     History reviewed. No pertinent family history.  History  Substance Use Topics  . Smoking status: Current Some Day Smoker -- 0.5 packs/day  . Smokeless tobacco: Not on file  . Alcohol Use:  No      Review of Systems  Constitutional: Positive for appetite change. Negative for fever, chills and diaphoresis.  Gastrointestinal: Positive for abdominal pain and diarrhea. Negative for nausea, vomiting and hematochezia.  All other systems reviewed and are negative.    Allergies  Review of patient's allergies indicates no known allergies.  Home Medications   Current Outpatient Rx  Name Route Sig Dispense Refill  . ACETAMINOPHEN 500 MG PO TABS Oral Take 1,000 mg by mouth every 6 (six) hours as needed. For pain      . IBUPROFEN 200 MG PO TABS Oral Take 400 mg by mouth every 6 (six) hours as needed. For pain     . IBUPROFEN-DIPHENHYDRAMINE HCL 200-25 MG PO CAPS Oral Take 2 tablets by mouth at bedtime as needed. For sleep     . HYDROCODONE-ACETAMINOPHEN 5-325 MG PO TABS Oral Take 1-2 tablets by mouth 2 (two) times daily.      Marland Kitchen HYDROCODONE-ACETAMINOPHEN 5-325 MG PO TABS  One po q 4-6 hrs prn pain 20 tablet 0  . OXYCODONE-ACETAMINOPHEN 5-325 MG PO TABS Oral Take 1 tablet by mouth every 4 (four) hours as needed.        BP 124/81  Pulse 82  Temp(Src) 97.9 F (36.6 C) (Oral)  Resp 20  SpO2 100%  Physical Exam  Nursing note and vitals reviewed. Constitutional: He is oriented to person, place, and time. He appears well-developed and well-nourished.  HENT:  Head: Normocephalic and atraumatic.  Eyes: EOM are normal.  Neck: Normal range of motion.  Cardiovascular: Normal rate, regular rhythm, normal heart sounds and intact distal pulses.   Pulmonary/Chest: Effort normal and breath sounds normal. No respiratory distress.  Abdominal: Soft. He exhibits no shifting dullness, no distension, no pulsatile liver, no fluid wave, no abdominal bruit, no ascites, no pulsatile midline mass and no mass. There is no hepatosplenomegaly. There is tenderness in the right lower quadrant. There is guarding and tenderness at McBurney's point. There is no rebound and no CVA tenderness.     Musculoskeletal: Normal range of motion.  Neurological: He is alert and oriented to person, place, and time.  Skin: Skin is warm and dry.  Psychiatric: He has a normal mood and affect. Judgment normal.    ED Course  Procedures (including critical care time)  Labs Reviewed  URINALYSIS, ROUTINE W REFLEX MICROSCOPIC - Abnormal; Notable for the following:    Ketones, ur 15 (*)    All other components within normal limits  COMPREHENSIVE METABOLIC PANEL - Abnormal; Notable for the following:    GFR calc non Af Amer 77 (*)    GFR calc Af Amer 89 (*)    All other components within normal limits  LIPASE, BLOOD - Abnormal; Notable for the following:    Lipase 88 (*)    All other components within normal limits  CBC  DIFFERENTIAL  KETONES, URINE   Ct Abdomen Pelvis W Contrast  06/30/2011  *RADIOLOGY REPORT*  Clinical Data: Right lower quadrant pain for 2 days.  History of renal cell carcinoma.  CT ABDOMEN AND PELVIS WITH CONTRAST  Technique:  Multidetector CT imaging of the abdomen and pelvis was performed following the standard protocol during bolus administration of intravenous contrast.  Contrast: OMNIPAQUE IOHEXOL 300 MG/ML IV SOLN  Comparison: CT 04/18/2011  Findings: Lung bases are clear.  No pericardial fluid.  No focal hepatic lesion.  The gallbladder, pancreas, spleen, adrenal glands, and left kidney normal.  Prior right nephrectomy.  No nodularity within the nephrectomy bed.  The stomach, small bowel, and terminal ileum are normal. A collapsed  normal appendix is partially visualized.  No periappendiceal inflammation.  Colon and rectosigmoid colon are normal.  Abdominal aorta is normal caliber.  There is a retroaortic left renal vein.  No retroperitoneal or periportal lymphadenopathy.  No free fluid the pelvis.  The prostate gland bladder normal.  No pelvic lymphadenopathy. Review of  bone windows demonstrates no aggressive osseous lesions.  IMPRESSION:  1.  No evidence of  appendicitis. 2.  Stable right nephrectomy bed. 3.  No acute process.  Original Report Authenticated By: Genevive Bi, M.D.     1. Abdominal pain       MDM  Discussed labs and CT results with pt and spouse.  Pt is to f/u with his PCP or if his sxs worsen or change significantly, return to the ED.  Medical screening examination/treatment/procedure(s) were performed by non-physician practitioner and as supervising physician I was immediately available for consultation/collaboration. Osvaldo Human, M.D.       Worthy Rancher, PA 06/30/11 1600  Carleene Cooper III, MD 06/30/11 [phone]

## 2011-06-30 NOTE — ED Notes (Signed)
Pt drinking oral contrast for ct scan

## 2011-06-30 NOTE — ED Notes (Signed)
MD at bedside. Speaking with pt and spouse

## 2011-06-30 NOTE — ED Notes (Signed)
Transported to ct scan 

## 2011-09-16 ENCOUNTER — Encounter (HOSPITAL_COMMUNITY): Payer: Self-pay

## 2011-09-16 ENCOUNTER — Emergency Department (HOSPITAL_COMMUNITY)
Admission: EM | Admit: 2011-09-16 | Discharge: 2011-09-16 | Disposition: A | Discharge status: Home / Self Care | Payer: Managed Care, Other (non HMO)

## 2011-09-16 ENCOUNTER — Encounter (HOSPITAL_BASED_OUTPATIENT_CLINIC_OR_DEPARTMENT_OTHER): Payer: Self-pay | Admitting: *Deleted

## 2011-09-16 ENCOUNTER — Emergency Department (HOSPITAL_BASED_OUTPATIENT_CLINIC_OR_DEPARTMENT_OTHER)
Admission: EM | Admit: 2011-09-16 | Discharge: 2011-09-16 | Disposition: A | Discharge status: Home / Self Care | Payer: Managed Care, Other (non HMO) | Attending: Emergency Medicine | Admitting: Emergency Medicine

## 2011-09-16 ENCOUNTER — Emergency Department (INDEPENDENT_AMBULATORY_CARE_PROVIDER_SITE_OTHER): Payer: Managed Care, Other (non HMO)

## 2011-09-16 DIAGNOSIS — F17200 Nicotine dependence, unspecified, uncomplicated: Secondary | ICD-10-CM | POA: Insufficient documentation

## 2011-09-16 DIAGNOSIS — Z905 Acquired absence of kidney: Secondary | ICD-10-CM

## 2011-09-16 DIAGNOSIS — R110 Nausea: Secondary | ICD-10-CM | POA: Insufficient documentation

## 2011-09-16 DIAGNOSIS — Z85528 Personal history of other malignant neoplasm of kidney: Secondary | ICD-10-CM

## 2011-09-16 DIAGNOSIS — R1031 Right lower quadrant pain: Secondary | ICD-10-CM | POA: Insufficient documentation

## 2011-09-16 DIAGNOSIS — R197 Diarrhea, unspecified: Secondary | ICD-10-CM | POA: Insufficient documentation

## 2011-09-16 LAB — CBC
HCT: 45 % (ref 39.0–52.0)
Hemoglobin: 15.1 g/dL (ref 13.0–17.0)
MCH: 29.4 pg (ref 26.0–34.0)
MCHC: 33.6 g/dL (ref 30.0–36.0)
MCV: 87.5 fL (ref 78.0–100.0)
Platelets: 244 10*3/uL (ref 150–400)
RBC: 5.14 MIL/uL (ref 4.22–5.81)
RDW: 13.1 % (ref 11.5–15.5)
WBC: 11 10*3/uL — ABNORMAL HIGH (ref 4.0–10.5)

## 2011-09-16 LAB — URINALYSIS, ROUTINE W REFLEX MICROSCOPIC
Bilirubin Urine: NEGATIVE
Glucose, UA: NEGATIVE mg/dL
Hgb urine dipstick: NEGATIVE
Ketones, ur: 15 mg/dL — AB
Leukocytes, UA: NEGATIVE
Nitrite: NEGATIVE
Protein, ur: NEGATIVE mg/dL
Specific Gravity, Urine: 1.024 (ref 1.005–1.030)
Urobilinogen, UA: 1 mg/dL (ref 0.0–1.0)
pH: 6 (ref 5.0–8.0)

## 2011-09-16 LAB — DIFFERENTIAL
Basophils Absolute: 0 10*3/uL (ref 0.0–0.1)
Basophils Relative: 0 % (ref 0–1)
Eosinophils Absolute: 0.1 10*3/uL (ref 0.0–0.7)
Eosinophils Relative: 1 % (ref 0–5)
Lymphocytes Relative: 19 % (ref 12–46)
Lymphs Abs: 2.1 10*3/uL (ref 0.7–4.0)
Monocytes Absolute: 0.5 10*3/uL (ref 0.1–1.0)
Monocytes Relative: 5 % (ref 3–12)
Neutro Abs: 8.2 10*3/uL — ABNORMAL HIGH (ref 1.7–7.7)
Neutrophils Relative %: 75 % (ref 43–77)

## 2011-09-16 LAB — COMPREHENSIVE METABOLIC PANEL
ALT: 12 U/L (ref 0–53)
AST: 17 U/L (ref 0–37)
Albumin: 4.6 g/dL (ref 3.5–5.2)
Alkaline Phosphatase: 116 U/L (ref 39–117)
BUN: 13 mg/dL (ref 6–23)
CO2: 24 mEq/L (ref 19–32)
Calcium: 10.1 mg/dL (ref 8.4–10.5)
Chloride: 103 mEq/L (ref 96–112)
Creatinine, Ser: 1.2 mg/dL (ref 0.50–1.35)
GFR calc Af Amer: 79 mL/min — ABNORMAL LOW (ref >90)
GFR calc non Af Amer: 68 mL/min — ABNORMAL LOW (ref >90)
Glucose, Bld: 116 mg/dL — ABNORMAL HIGH (ref 70–99)
Potassium: 3.9 mEq/L (ref 3.5–5.1)
Sodium: 140 mEq/L (ref 135–145)
Total Bilirubin: 0.5 mg/dL (ref 0.3–1.2)
Total Protein: 7.8 g/dL (ref 6.0–8.3)

## 2011-09-16 LAB — LIPASE, BLOOD: Lipase: 64 U/L — ABNORMAL HIGH (ref 11–59)

## 2011-09-16 MED ORDER — HYDROCODONE-ACETAMINOPHEN 5-500 MG PO TABS: 1.0000 | ORAL_TABLET | Freq: Four times a day (QID) | ORAL | Status: AC | PRN

## 2011-09-16 MED ORDER — MORPHINE SULFATE 4 MG/ML IJ SOLN
4.0000 mg | Freq: Once | INTRAMUSCULAR | Status: AC
  Administered 2011-09-16: 4 mg via INTRAVENOUS
  Filled 2011-09-16: qty 1

## 2011-09-16 MED ORDER — ONDANSETRON HCL 4 MG/2ML IJ SOLN
4.0000 mg | Freq: Once | INTRAMUSCULAR | Status: AC
  Administered 2011-09-16: 4 mg via INTRAVENOUS
  Filled 2011-09-16: qty 2

## 2011-09-16 MED ORDER — IOHEXOL 300 MG/ML  SOLN
80.0000 mL | Freq: Once | INTRAMUSCULAR | Status: AC | PRN
  Administered 2011-09-16: 80 mL via INTRAVENOUS

## 2011-09-16 MED ORDER — IOHEXOL 300 MG/ML  SOLN
20.0000 mL | INTRAMUSCULAR | Status: AC
  Administered 2011-09-16 (×2): 20 mL via ORAL

## 2011-09-16 NOTE — ED Notes (Signed)
Pt states he has had RLQ pain for a few days. Hx kidney cancer and removal. U/S of abd scheduled for the 14th, but "can't wait". "Burning"

## 2011-09-16 NOTE — ED Provider Notes (Signed)
Medical screening examination/treatment/procedure(s) were performed by non-physician practitioner and as supervising physician I was immediately available for consultation/collaboration.    Daylen Lipsky L Sanskriti Greenlaw, MD 09/16/11 2314 

## 2011-09-16 NOTE — ED Provider Notes (Signed)
History     CSN: [id number]  Arrival date & time 09/16/11  1420   First MD Initiated Contact with Patient 09/16/11 1618      Chief Complaint  Patient presents with  . Abdominal Pain    (Consider location/radiation/quality/duration/timing/severity/associated sxs/prior treatment) HPI Comments: Pt states that he has a nephrectomy about 1 year ago:pt states that in the last 5 days he is having pain in his right abdomen near the surgery:pt state that he is supposed to have and u/s on 1/14 for follow upon the cancer but the pain was not going away so he states that he couldn't wait  Patient is a 51 y.o. male presenting with abdominal pain. The history is provided by the patient. No language interpreter was used.  Abdominal Pain The primary symptoms of the illness include abdominal pain, nausea and diarrhea. The primary symptoms of the illness do not include fever, shortness of breath, vomiting or dysuria. The current episode started more than 2 days ago. The onset of the illness was gradual. The problem has been gradually worsening.  The patient has not had a change in bowel habit. Risk factors for an acute abdominal problem include a history of abdominal surgery. Symptoms associated with the illness do not include hematuria, frequency or back pain.    Past Medical History  Diagnosis Date  . Kidney carcinoma     Past Surgical History  Procedure Date  . Nephrectomy   . Hernia repair     History reviewed. No pertinent family history.  History  Substance Use Topics  . Smoking status: Current Some Day Smoker -- 0.5 packs/day  . Smokeless tobacco: Not on file  . Alcohol Use: No      Review of Systems  Constitutional: Negative for fever.  Respiratory: Negative for shortness of breath.   Gastrointestinal: Positive for nausea, abdominal pain and diarrhea. Negative for vomiting.  Genitourinary: Negative for dysuria, frequency and hematuria.  Musculoskeletal: Negative for back pain.    All other systems reviewed and are negative.    Allergies  Review of patient's allergies indicates no known allergies.  Home Medications   Current Outpatient Rx  Name Route Sig Dispense Refill  . HYDROCODONE-ACETAMINOPHEN 5-325 MG PO TABS Oral Take 2 tablets by mouth every 4 (four) hours as needed. for pain       BP 120/75  Pulse 70  Temp(Src) 97.5 F (36.4 C) (Oral)  Resp 20  Ht 5\' 9"  (1.753 m)  Wt 175 lb (79.379 kg)  BMI 25.84 kg/m2  SpO2 100%  Physical Exam  Nursing note and vitals reviewed. Constitutional: He is oriented to person, place, and time. He appears well-developed and well-nourished.  HENT:  Head: Normocephalic and atraumatic.  Eyes: Conjunctivae are normal.  Cardiovascular: Normal rate and regular rhythm.   Pulmonary/Chest: Effort normal and breath sounds normal.  Abdominal: Soft. There is tenderness in the right upper quadrant and right lower quadrant.  Musculoskeletal: Normal range of motion.  Neurological: He is alert and oriented to person, place, and time.  Skin: Skin is warm and dry.  Psychiatric: He has a normal mood and affect.    ED Course  Procedures (including critical care time)  Labs Reviewed  URINALYSIS, ROUTINE W REFLEX MICROSCOPIC - Abnormal; Notable for the following:    Ketones, ur 15 (*)    All other components within normal limits  COMPREHENSIVE METABOLIC PANEL - Abnormal; Notable for the following:    Glucose, Bld 116 (*)    GFR calc  non Af Amer 68 (*)    GFR calc Af Amer 79 (*)    All other components within normal limits  CBC - Abnormal; Notable for the following:    WBC 11.0 (*)    All other components within normal limits  DIFFERENTIAL - Abnormal; Notable for the following:    Neutro Abs 8.2 (*)    All other components within normal limits  LIPASE, BLOOD - Abnormal; Notable for the following:    Lipase 64 (*)    All other components within normal limits   Ct Abdomen Pelvis W Contrast  09/16/2011  *RADIOLOGY REPORT*   Clinical Data: Right lower quadrant pain, history of renal cancer status post right nephrectomy  CT ABDOMEN AND PELVIS WITH CONTRAST  Technique:  Multidetector CT imaging of the abdomen and pelvis was performed following the standard protocol during bolus administration of intravenous contrast.  Contrast: 80mL OMNIPAQUE IOHEXOL 300 MG/ML IV SOLN  Comparison: Multiple priors, most recently 06/30/2011  Findings: Lung bases are clear.  Liver, spleen, pancreas, and adrenal glands are within normal limits.  Gallbladder is unremarkable.  No intrahepatic or extrahepatic ductal dilatation.  Status post right nephrectomy.  No abnormal soft tissue in the surgical bed.  The kidneys within normal limits.  No hydronephrosis.  No evidence of bowel obstruction.  Normal appendix.  No evidence of abdominal aortic aneurysm.  Circumaortic left renal vein.  No abdominopelvic ascites.  No suspicious abdominopelvic lymphadenopathy.  Prostate is unremarkable.  Bladder is within normal limits.  Mild degenerative changes of the visualized thoracolumbar spine.  IMPRESSION: Normal appendix.  No evidence of bowel obstruction.  Status post right nephrectomy.  No evidence of recurrent or metastatic disease in the abdomen/pelvis.  No interval change from multiple recent prior studies.  Original Report Authenticated By: Charline Bills, M.D.     1. Abdominal pain       MDM  No acute findings noted on ct:pt lipase is lower then previous visits:pt okay to follow up with his doctor for continued symptoms       Teressa Lower, NP 09/16/11 1849

## 2011-09-16 NOTE — ED Notes (Signed)
Patient reports that he has had chronic pain x 1 year at right flank where kidney was removed, to have u/s on 1/14

## 2011-09-30 ENCOUNTER — Emergency Department (HOSPITAL_COMMUNITY)
Admission: EM | Admit: 2011-09-30 | Discharge: 2011-09-30 | Disposition: A | Discharge status: Home / Self Care | Payer: Managed Care, Other (non HMO) | Attending: Emergency Medicine | Admitting: Emergency Medicine

## 2011-09-30 ENCOUNTER — Encounter (HOSPITAL_COMMUNITY): Payer: Self-pay

## 2011-09-30 DIAGNOSIS — Z85528 Personal history of other malignant neoplasm of kidney: Secondary | ICD-10-CM | POA: Insufficient documentation

## 2011-09-30 DIAGNOSIS — F17200 Nicotine dependence, unspecified, uncomplicated: Secondary | ICD-10-CM | POA: Insufficient documentation

## 2011-09-30 DIAGNOSIS — R10812 Left upper quadrant abdominal tenderness: Secondary | ICD-10-CM | POA: Insufficient documentation

## 2011-09-30 DIAGNOSIS — R109 Unspecified abdominal pain: Secondary | ICD-10-CM | POA: Insufficient documentation

## 2011-09-30 LAB — DIFFERENTIAL
Basophils Absolute: 0 10*3/uL (ref 0.0–0.1)
Basophils Relative: 0 % (ref 0–1)
Eosinophils Absolute: 0.1 10*3/uL (ref 0.0–0.7)
Eosinophils Relative: 1 % (ref 0–5)
Lymphocytes Relative: 14 % (ref 12–46)
Lymphs Abs: 1.5 10*3/uL (ref 0.7–4.0)
Monocytes Absolute: 0.8 10*3/uL (ref 0.1–1.0)
Monocytes Relative: 8 % (ref 3–12)
Neutro Abs: 8.4 10*3/uL — ABNORMAL HIGH (ref 1.7–7.7)
Neutrophils Relative %: 78 % — ABNORMAL HIGH (ref 43–77)

## 2011-09-30 LAB — COMPREHENSIVE METABOLIC PANEL
ALT: 12 U/L (ref 0–53)
AST: 19 U/L (ref 0–37)
Albumin: 4 g/dL (ref 3.5–5.2)
Alkaline Phosphatase: 99 U/L (ref 39–117)
BUN: 16 mg/dL (ref 6–23)
CO2: 23 mEq/L (ref 19–32)
Calcium: 9.4 mg/dL (ref 8.4–10.5)
Chloride: 105 mEq/L (ref 96–112)
Creatinine, Ser: 1.14 mg/dL (ref 0.50–1.35)
GFR calc Af Amer: 84 mL/min — ABNORMAL LOW (ref >90)
GFR calc non Af Amer: 73 mL/min — ABNORMAL LOW (ref >90)
Glucose, Bld: 98 mg/dL (ref 70–99)
Potassium: 4.1 mEq/L (ref 3.5–5.1)
Sodium: 138 mEq/L (ref 135–145)
Total Bilirubin: 0.3 mg/dL (ref 0.3–1.2)
Total Protein: 7.3 g/dL (ref 6.0–8.3)

## 2011-09-30 LAB — URINALYSIS, ROUTINE W REFLEX MICROSCOPIC
Bilirubin Urine: NEGATIVE
Glucose, UA: NEGATIVE mg/dL
Hgb urine dipstick: NEGATIVE
Ketones, ur: NEGATIVE mg/dL
Leukocytes, UA: NEGATIVE
Nitrite: NEGATIVE
Protein, ur: 30 mg/dL — AB
Specific Gravity, Urine: 1.024 (ref 1.005–1.030)
Urobilinogen, UA: 1 mg/dL (ref 0.0–1.0)
pH: 5.5 (ref 5.0–8.0)

## 2011-09-30 LAB — CBC
HCT: 41.2 % (ref 39.0–52.0)
Hemoglobin: 13.8 g/dL (ref 13.0–17.0)
MCH: 30 pg (ref 26.0–34.0)
MCHC: 33.5 g/dL (ref 30.0–36.0)
MCV: 89.6 fL (ref 78.0–100.0)
Platelets: 213 10*3/uL (ref 150–400)
RBC: 4.6 MIL/uL (ref 4.22–5.81)
RDW: 13.1 % (ref 11.5–15.5)
WBC: 10.8 10*3/uL — ABNORMAL HIGH (ref 4.0–10.5)

## 2011-09-30 LAB — LIPASE, BLOOD: Lipase: 48 U/L (ref 11–59)

## 2011-09-30 LAB — URINE MICROSCOPIC-ADD ON

## 2011-09-30 MED ORDER — HYDROCODONE-ACETAMINOPHEN 10-325 MG PO TABS: 1.0000 | ORAL_TABLET | Freq: Four times a day (QID) | ORAL | Status: AC | PRN

## 2011-09-30 MED ORDER — HYDROMORPHONE HCL PF 1 MG/ML IJ SOLN
1.0000 mg | Freq: Once | INTRAMUSCULAR | Status: AC
  Administered 2011-09-30: 1 mg via INTRAVENOUS
  Filled 2011-09-30: qty 1

## 2011-09-30 MED ORDER — SODIUM CHLORIDE 0.9 % IJ SOLN
3.0000 mL | INTRAMUSCULAR | Status: AC
  Administered 2011-09-30: 3 mL via INTRAVENOUS

## 2011-09-30 NOTE — ED Notes (Signed)
Patient given discharge instructions, information, prescriptions, and diet order. Patient states that they adequately understand discharge information given and to return to ED if symptoms return or worsen.     

## 2011-09-30 NOTE — ED Notes (Addendum)
Pt sts that he began having right flank pain yesterday that has worsen today and is at a level 8/10. Patient unable to get comfortable and wincing from pain. Pt sts that he has a hx of kidney problems. Bowel sounds audible all quads. Pt sts he has been unable to eat and drink normally since yesterday. 20 gauge inserted into right AC. Labs drawn and sent to lab.

## 2011-09-30 NOTE — ED Provider Notes (Signed)
History     CSN: [id number]  Arrival date & time 09/30/11  1406   First MD Initiated Contact with Patient 09/30/11 1500      Chief Complaint  Patient presents with  . Flank Pain    kidney ca    (Consider location/radiation/quality/duration/timing/severity/associated sxs/prior treatment) HPI The patient has a history of renal carcinoma, now status post right nephrectomy.  He now presents with left upper quadrant pain.  He notes the pain began about one month ago, and since onset has become significantly worse.  The pain is described as sharp, nonradiating, worse in the morning, minimally improved with Advil.  Patient denies any nausea, vomiting, diarrhea, dysuria.  He does endorse chills.  He denies fevers, weight loss, confusion.  Past Medical History  Diagnosis Date  . Kidney carcinoma     Past Surgical History  Procedure Date  . Nephrectomy   . Hernia repair     History reviewed. No pertinent family history.  History  Substance Use Topics  . Smoking status: Current Some Day Smoker -- 0.5 packs/day  . Smokeless tobacco: Not on file  . Alcohol Use: No      Review of Systems  Constitutional:       Per HPI, otherwise negative  HENT:       Per HPI, otherwise negative  Eyes: Negative.   Respiratory:       Per HPI, otherwise negative  Cardiovascular:       Per HPI, otherwise negative  Gastrointestinal: Negative for vomiting.  Genitourinary: Negative for dysuria, urgency, hematuria, flank pain, decreased urine volume, penile swelling, scrotal swelling, penile pain and testicular pain.  Musculoskeletal:       Per HPI, otherwise negative  Skin: Negative.   Neurological: Negative for syncope.    Allergies  Review of patient's allergies indicates no known allergies.  Home Medications   Current Outpatient Rx  Name Route Sig Dispense Refill  . ASPIRIN EC 81 MG PO TBEC Oral Take 81 mg by mouth daily.    Marland Kitchen HYDROCODONE-ACETAMINOPHEN 5-325 MG PO TABS Oral Take 2  tablets by mouth every 4 (four) hours as needed. for pain     . IBUPROFEN-DIPHENHYDRAMINE HCL 200-25 MG PO CAPS Oral Take 1 tablet by mouth at bedtime. To help sleep      BP 130/105  Pulse 92  Temp(Src) 98.9 F (37.2 C) (Oral)  Resp 22  SpO2 100%  Physical Exam  Nursing note and vitals reviewed. Constitutional: He is oriented to person, place, and time. He appears well-developed. No distress.  HENT:  Head: Normocephalic and atraumatic.  Eyes: Conjunctivae and EOM are normal.  Cardiovascular: Normal rate and regular rhythm.   Pulmonary/Chest: Effort normal. No stridor. No respiratory distress.  Abdominal: He exhibits no distension. There is no hepatosplenomegaly. There is tenderness in the left upper quadrant. There is no rigidity, no rebound, no guarding and no CVA tenderness.  Musculoskeletal: He exhibits no edema.  Neurological: He is alert and oriented to person, place, and time.  Skin: Skin is warm and dry.  Psychiatric: He has a normal mood and affect.    ED Course  Procedures (including critical care time)   Labs Reviewed  COMPREHENSIVE METABOLIC PANEL  CBC  DIFFERENTIAL  LIPASE, BLOOD  URINALYSIS, ROUTINE W REFLEX MICROSCOPIC   No results found.   No diagnosis found.   Patient has four CT evaluations within the past year (last 14d ago), no acute findings about the left kidney.   MDM  This  51 year old male with renal carcinoma, now status post nephrectomy presents with left quadrant pain notably, the patient has a history of abdominal pain.  Over the past year since his surgery and has been evaluated with CAT scans, 4 times over this interval, including 2 weeks ago, all the studies did not show acute pathology the patient's labs today are reassuring, as are his normal.  Vital signs, and the patient's description of continued by mouth tolerance, no vomiting, no weight loss, no dysuria.  On exam, the patient is in no distress, with minimal tenderness to palpation  and no evidence of significant abdominal pathology.  The patient has not achieved adequate analgesia with his home medications.  He will be discharged with increasing doses of narcotics, PMD, and ultrasound followup.        Gerhard Munch, MD 09/30/11 [phone]

## 2011-09-30 NOTE — ED Notes (Signed)
Pt is in with left kidney pain hx of kidney ca no relief with meds taken

## 2011-11-25 ENCOUNTER — Encounter (HOSPITAL_BASED_OUTPATIENT_CLINIC_OR_DEPARTMENT_OTHER): Payer: Self-pay | Admitting: *Deleted

## 2011-11-25 ENCOUNTER — Emergency Department (HOSPITAL_BASED_OUTPATIENT_CLINIC_OR_DEPARTMENT_OTHER)
Admission: EM | Admit: 2011-11-25 | Discharge: 2011-11-25 | Disposition: A | Discharge status: Home / Self Care | Payer: Managed Care, Other (non HMO) | Attending: Emergency Medicine | Admitting: Emergency Medicine

## 2011-11-25 DIAGNOSIS — G8929 Other chronic pain: Secondary | ICD-10-CM | POA: Insufficient documentation

## 2011-11-25 DIAGNOSIS — Z85528 Personal history of other malignant neoplasm of kidney: Secondary | ICD-10-CM | POA: Insufficient documentation

## 2011-11-25 DIAGNOSIS — Z905 Acquired absence of kidney: Secondary | ICD-10-CM | POA: Insufficient documentation

## 2011-11-25 DIAGNOSIS — Z7982 Long term (current) use of aspirin: Secondary | ICD-10-CM | POA: Insufficient documentation

## 2011-11-25 DIAGNOSIS — F17200 Nicotine dependence, unspecified, uncomplicated: Secondary | ICD-10-CM | POA: Insufficient documentation

## 2011-11-25 DIAGNOSIS — R109 Unspecified abdominal pain: Secondary | ICD-10-CM | POA: Insufficient documentation

## 2011-11-25 LAB — URINALYSIS, ROUTINE W REFLEX MICROSCOPIC
Bilirubin Urine: NEGATIVE
Glucose, UA: NEGATIVE mg/dL
Hgb urine dipstick: NEGATIVE
Ketones, ur: NEGATIVE mg/dL
Leukocytes, UA: NEGATIVE
Nitrite: NEGATIVE
Protein, ur: NEGATIVE mg/dL
Specific Gravity, Urine: 1.019 (ref 1.005–1.030)
Urobilinogen, UA: 1 mg/dL (ref 0.0–1.0)
pH: 7 (ref 5.0–8.0)

## 2011-11-25 MED ORDER — HYDROMORPHONE HCL PF 2 MG/ML IJ SOLN
2.0000 mg | Freq: Once | INTRAMUSCULAR | Status: AC
  Administered 2011-11-25: 2 mg via INTRAMUSCULAR
  Filled 2011-11-25: qty 1

## 2011-11-25 MED ORDER — ONDANSETRON HCL 4 MG/2ML IJ SOLN
4.0000 mg | Freq: Once | INTRAMUSCULAR | Status: AC
  Administered 2011-11-25: 4 mg via INTRAMUSCULAR
  Filled 2011-11-25: qty 2

## 2011-11-25 NOTE — ED Provider Notes (Signed)
Medical screening examination/treatment/procedure(s) were performed by non-physician practitioner and as supervising physician I was immediately available for consultation/collaboration.   Shanina Kepple A. Elohim Brune, MD 11/25/11 2356 

## 2011-11-25 NOTE — ED Provider Notes (Signed)
History     CSN: [id number]  Arrival date & time 11/25/11  1508   First MD Initiated Contact with Patient 11/25/11 1610      Chief Complaint  Patient presents with  . Flank Pain    (Consider location/radiation/quality/duration/timing/severity/associated sxs/prior treatment) Patient is a 51 y.o. male presenting with flank pain. The history is provided by the patient. No language interpreter was used.  Flank Pain This is a chronic problem. The current episode started more than 1 year ago. The problem has been gradually worsening. Associated symptoms include abdominal pain. The symptoms are aggravated by nothing. He has tried oral narcotics for the symptoms. The treatment provided no relief.  Pt reports he has had pain since surgery.  (Pt has had multiple evaluations for same. )  Pt has had 5 ct scans in the past one year.  Past Medical History  Diagnosis Date  . Kidney carcinoma     Past Surgical History  Procedure Date  . Nephrectomy   . Hernia repair     No family history on file.  History  Substance Use Topics  . Smoking status: Current Some Day Smoker -- 0.5 packs/day  . Smokeless tobacco: Not on file  . Alcohol Use: No      Review of Systems  Gastrointestinal: Positive for abdominal pain.  Genitourinary: Positive for flank pain.  All other systems reviewed and are negative.    Allergies  Review of patient's allergies indicates no known allergies.  Home Medications   Current Outpatient Rx  Name Route Sig Dispense Refill  . ASPIRIN EC 81 MG PO TBEC Oral Take 81 mg by mouth daily.    . OXYCODONE-ACETAMINOPHEN 5-325 MG PO TABS Oral Take 1 tablet by mouth 2 (two) times daily.      BP 125/86  Pulse 90  Temp(Src) 97.8 F (36.6 C) (Oral)  Resp 20  Ht 5\' 9"  (1.753 m)  Wt 170 lb (77.111 kg)  BMI 25.10 kg/m2  SpO2 98%  Physical Exam  Nursing note and vitals reviewed. Constitutional: He is oriented to person, place, and time. He appears well-developed  and well-nourished.  HENT:  Head: Normocephalic and atraumatic.  Eyes: Conjunctivae and EOM are normal. Pupils are equal, round, and reactive to light.  Neck: Normal range of motion. Neck supple.  Cardiovascular: Normal rate and normal heart sounds.   Pulmonary/Chest: Effort normal and breath sounds normal.  Abdominal: Soft.  Musculoskeletal: Normal range of motion.  Neurological: He is alert and oriented to person, place, and time. He has normal reflexes.  Skin: Skin is warm.  Psychiatric: He has a normal mood and affect.    ED Course  Procedures (including critical care time)   Labs Reviewed  URINALYSIS, ROUTINE W REFLEX MICROSCOPIC   No results found.   No diagnosis found.    MDM  Pt given dilaudid and zofran Im.  I advised pt he needs to discuss on going pain management with his MD.        Elson Areas, PA 11/25/11 1706

## 2011-11-25 NOTE — ED Notes (Signed)
No RX given. Family to drive.

## 2011-11-25 NOTE — ED Notes (Signed)
Pt states he had his right kidney removed about 1-1/2 years ago. Has been on meds that his Dr. Is trying to wean him from in hopes that he can have him f/u in a pain clinic. Yesterday pain got worse and was constant. Denies other s/s.

## 2011-11-25 NOTE — Discharge Instructions (Signed)
Chronic Pain Management  Managing chronic pain is not easy. The goal is to provide as much pain relief as possible. There are emotional as well as physical problems. Chronic pain may lead to symptoms of depression which magnify those of the pain.  Problems may include:   Anxiety.    Sleep disturbances.    Confused thinking.    Feeling cranky.    Fatigue.    Weight gain or loss.   Identify the source of the pain first, if possible. The pain may be masking another problem. Try to find a pain management specialist or clinic. Work with a team to create a treatment plan for you.  MEDICATIONS   May include narcotics or opioids. Larger than normal doses may be needed to control your pain.    Drugs for depression may help.    Over-the-counter medicines may help for some conditions. These drugs may be used along with others for better pain relief.    May be injected into sites such as the spine and joints. Injections may have to be repeated if they wear off.   THERAPY MAY INCLUDE:   Working with a physical therapist to keep from getting stiff.    Regular, gentle exercise.    Cognitive or behavioral therapy.    Using complementary or integrative medicine such as:    Acupuncture.    Massage, Reiki, or Rolfing.    Aroma, color, light, or sound therapy.    Group support.   FOR MORE INFORMATION  http://www.painfoundation.org.  American Chronic Pain Association http://www.thealpa.org.  Document Released: 10/05/2004 Document Revised: 08/17/2011 Document Reviewed: 11/14/2007  [employer] Patient Information 2012 [employer].

## 2011-11-26 ENCOUNTER — Encounter (HOSPITAL_COMMUNITY): Payer: Self-pay

## 2011-11-26 ENCOUNTER — Emergency Department (HOSPITAL_COMMUNITY)
Admission: EM | Admit: 2011-11-26 | Discharge: 2011-11-26 | Disposition: A | Discharge status: Home / Self Care | Payer: Managed Care, Other (non HMO) | Attending: Emergency Medicine | Admitting: Emergency Medicine

## 2011-11-26 ENCOUNTER — Emergency Department (HOSPITAL_COMMUNITY): Payer: Managed Care, Other (non HMO)

## 2011-11-26 DIAGNOSIS — Z85528 Personal history of other malignant neoplasm of kidney: Secondary | ICD-10-CM | POA: Insufficient documentation

## 2011-11-26 DIAGNOSIS — Z905 Acquired absence of kidney: Secondary | ICD-10-CM | POA: Insufficient documentation

## 2011-11-26 DIAGNOSIS — R109 Unspecified abdominal pain: Secondary | ICD-10-CM | POA: Insufficient documentation

## 2011-11-26 DIAGNOSIS — F17200 Nicotine dependence, unspecified, uncomplicated: Secondary | ICD-10-CM | POA: Insufficient documentation

## 2011-11-26 LAB — BASIC METABOLIC PANEL
BUN: 18 mg/dL (ref 6–23)
CO2: 27 mEq/L (ref 19–32)
Calcium: 9.5 mg/dL (ref 8.4–10.5)
Chloride: 100 mEq/L (ref 96–112)
Creatinine, Ser: 1.17 mg/dL (ref 0.50–1.35)
GFR calc Af Amer: 82 mL/min — ABNORMAL LOW (ref >90)
GFR calc non Af Amer: 71 mL/min — ABNORMAL LOW (ref >90)
Glucose, Bld: 96 mg/dL (ref 70–99)
Potassium: 4.1 mEq/L (ref 3.5–5.1)
Sodium: 135 mEq/L (ref 135–145)

## 2011-11-26 LAB — URINALYSIS, ROUTINE W REFLEX MICROSCOPIC
Bilirubin Urine: NEGATIVE
Glucose, UA: NEGATIVE mg/dL
Hgb urine dipstick: NEGATIVE
Ketones, ur: NEGATIVE mg/dL
Leukocytes, UA: NEGATIVE
Nitrite: NEGATIVE
Protein, ur: NEGATIVE mg/dL
Specific Gravity, Urine: 1.024 (ref 1.005–1.030)
Urobilinogen, UA: 1 mg/dL (ref 0.0–1.0)
pH: 6 (ref 5.0–8.0)

## 2011-11-26 LAB — CBC
HCT: 46.7 % (ref 39.0–52.0)
Hemoglobin: 16 g/dL (ref 13.0–17.0)
MCH: 30.8 pg (ref 26.0–34.0)
MCHC: 34.3 g/dL (ref 30.0–36.0)
MCV: 90 fL (ref 78.0–100.0)
Platelets: 218 10*3/uL (ref 150–400)
RBC: 5.19 MIL/uL (ref 4.22–5.81)
RDW: 13.2 % (ref 11.5–15.5)
WBC: 9.8 10*3/uL (ref 4.0–10.5)

## 2011-11-26 LAB — DIFFERENTIAL
Basophils Absolute: 0 10*3/uL (ref 0.0–0.1)
Basophils Relative: 0 % (ref 0–1)
Eosinophils Absolute: 0.1 10*3/uL (ref 0.0–0.7)
Eosinophils Relative: 1 % (ref 0–5)
Lymphocytes Relative: 26 % (ref 12–46)
Lymphs Abs: 2.6 10*3/uL (ref 0.7–4.0)
Monocytes Absolute: 0.7 10*3/uL (ref 0.1–1.0)
Monocytes Relative: 7 % (ref 3–12)
Neutro Abs: 6.4 10*3/uL (ref 1.7–7.7)
Neutrophils Relative %: 65 % (ref 43–77)

## 2011-11-26 MED ORDER — HYDROCODONE-ACETAMINOPHEN 5-500 MG PO TABS: 2.0000 | ORAL_TABLET | Freq: Four times a day (QID) | ORAL | Status: AC | PRN

## 2011-11-26 MED ORDER — MORPHINE SULFATE 4 MG/ML IJ SOLN
4.0000 mg | Freq: Once | INTRAMUSCULAR | Status: AC
  Administered 2011-11-26: 4 mg via INTRAVENOUS
  Filled 2011-11-26 (×2): qty 1

## 2011-11-26 MED ORDER — MORPHINE SULFATE 4 MG/ML IJ SOLN
4.0000 mg | Freq: Once | INTRAMUSCULAR | Status: AC
  Administered 2011-11-26: 4 mg via INTRAVENOUS
  Filled 2011-11-26: qty 1

## 2011-11-26 MED ORDER — SODIUM CHLORIDE 0.9 % IV SOLN
INTRAVENOUS | Status: DC
  Administered 2011-11-26: 12:00:00 via INTRAVENOUS

## 2011-11-26 MED ORDER — ONDANSETRON HCL 4 MG/2ML IJ SOLN
4.0000 mg | Freq: Once | INTRAMUSCULAR | Status: AC
  Administered 2011-11-26: 4 mg via INTRAVENOUS
  Filled 2011-11-26: qty 2

## 2011-11-26 NOTE — Discharge Instructions (Signed)
Your x-rays do not show any signs of an obstruction.  Your blood tests do not show any significant abnormalities.  Use Vicodin for pain.  Followup with your physician, for reevaluation.  Return for worse or uncontrolled symptoms

## 2011-11-26 NOTE — ED Notes (Signed)
Patient reports that he had kidney removed 18 months ago and has developed right flank for several weeks. Patient reports that his MD is sending him to the pain clinic. No left flank discomfort

## 2011-11-26 NOTE — ED Notes (Signed)
Pt states that he had his right kidney removed about a year ago.  He has been taking pain medicine since then.  C/o chronic right flank pain that "makes him fall to his knees".  His doctor is on vacation and said that when he gets back, he is going to send him to a pain specialist.

## 2011-11-26 NOTE — ED Provider Notes (Signed)
History     CSN: [id number]  Arrival date & time 11/26/11  1025   First MD Initiated Contact with Patient 11/26/11 1120      Chief Complaint  Patient presents with  . Flank Pain    (Consider location/radiation/quality/duration/timing/severity/associated sxs/prior treatment) Patient is a 51 y.o. male presenting with flank pain. The history is provided by the patient.  Flank Pain Associated symptoms include abdominal pain. Pertinent negatives include no chest pain, no headaches and no shortness of breath.   the patient is a 51 year old, male, who presents to emergency department complaining of right sided abdominal pain since this morning.  He says that it is so severe that it is similar so, he is unable to walk.  He states that he has had a right nephrectomy approximately a year ago.  For kidney cancer.  The nephrectomy was performed through an abdominal approach on the right side. He denies nausea, vomiting, diarrhea, or blood in his urine.  He has not had a fever.  He has not had any other abdominal surgeries.  The patient states that he has been having similar pain since his nephrectomy and that his physician told him that.  This may be terminated.  Usually the pain is intermittent.  However, this episode today.  Has been persistent since the onset this morning.  Past Medical History  Diagnosis Date  . Kidney carcinoma     Past Surgical History  Procedure Date  . Nephrectomy   . Hernia repair     No family history on file.  History  Substance Use Topics  . Smoking status: Current Some Day Smoker -- 0.5 packs/day  . Smokeless tobacco: Not on file  . Alcohol Use: No      Review of Systems  Constitutional: Negative for fever and chills.  Respiratory: Negative for cough and shortness of breath.   Cardiovascular: Negative for chest pain.  Gastrointestinal: Positive for abdominal pain. Negative for nausea, vomiting, diarrhea, constipation and blood in stool.    Genitourinary: Positive for flank pain. Negative for dysuria and hematuria.  Neurological: Negative for headaches.  Psychiatric/Behavioral: Negative for confusion.  All other systems reviewed and are negative.    Allergies  Review of patient's allergies indicates no known allergies.  Home Medications   Current Outpatient Rx  Name Route Sig Dispense Refill  . ASPIRIN EC 81 MG PO TBEC Oral Take 81 mg by mouth daily.    . OXYCODONE-ACETAMINOPHEN 5-325 MG PO TABS Oral Take 1 tablet by mouth 2 (two) times daily.      BP 119/67  Pulse 85  Temp(Src) 98 F (36.7 C) (Oral)  Resp 16  Ht 5\' 9"  (1.753 m)  Wt 170 lb (77.111 kg)  BMI 25.10 kg/m2  SpO2 100%  Physical Exam  Vitals reviewed. Constitutional: He is oriented to person, place, and time. He appears well-developed and well-nourished. No distress.  HENT:  Head: Normocephalic and atraumatic.  Eyes: EOM are normal. Pupils are equal, round, and reactive to light.  Neck: Normal range of motion. Neck supple.  Cardiovascular: Normal rate, regular rhythm and normal heart sounds.   No murmur heard. Pulmonary/Chest: Effort normal and breath sounds normal. No respiratory distress. He has no wheezes. He has no rales.  Abdominal: Soft. Bowel sounds are normal. He exhibits no distension and no mass. There is tenderness. There is no rebound and no guarding.       Abdominal incision, well-healed to the right of the midline at the level of the  umbilicus with tenderness over this area.  No guarding, rebound or distention  Musculoskeletal: Normal range of motion. He exhibits no edema and no tenderness.  Neurological: He is alert and oriented to person, place, and time. No cranial nerve deficit.  Skin: Skin is warm and dry. He is not diaphoretic.  Psychiatric: He has a normal mood and affect. His behavior is normal.    ED Course  Procedures (including critical care time) 51 year old male, with right sided abdominal pain, with no other  symptoms.  He has a history of a right nephrectomy.  There is no tenderness in the right upper quadrant, where his gallbladder is.  In the tenderness is not over McBurney's point.  Either.  We will perform an x-ray, and laboratory testing, and treat him symptomatically to   Labs Reviewed  CBC  DIFFERENTIAL  BASIC METABOLIC PANEL  URINALYSIS, ROUTINE W REFLEX MICROSCOPIC   No results found.   No diagnosis found.  2:21 PM Pain improved but not resolved.  MDM  Abdominal pain No acute abdomen.  No toxicity.  No evidence of small bowel obstruction to        Cheri Guppy, MD 11/26/11 1422

## 2011-12-23 ENCOUNTER — Emergency Department (HOSPITAL_BASED_OUTPATIENT_CLINIC_OR_DEPARTMENT_OTHER)
Admission: EM | Admit: 2011-12-23 | Discharge: 2011-12-23 | Disposition: A | Discharge status: Home / Self Care | Payer: Managed Care, Other (non HMO) | Attending: Emergency Medicine | Admitting: Emergency Medicine

## 2011-12-23 ENCOUNTER — Emergency Department (INDEPENDENT_AMBULATORY_CARE_PROVIDER_SITE_OTHER): Payer: Managed Care, Other (non HMO)

## 2011-12-23 ENCOUNTER — Encounter (HOSPITAL_BASED_OUTPATIENT_CLINIC_OR_DEPARTMENT_OTHER): Payer: Self-pay | Admitting: Emergency Medicine

## 2011-12-23 DIAGNOSIS — S20419A Abrasion of unspecified back wall of thorax, initial encounter: Secondary | ICD-10-CM

## 2011-12-23 DIAGNOSIS — X58XXXA Exposure to other specified factors, initial encounter: Secondary | ICD-10-CM

## 2011-12-23 DIAGNOSIS — M549 Dorsalgia, unspecified: Secondary | ICD-10-CM

## 2011-12-23 DIAGNOSIS — M545 Low back pain, unspecified: Secondary | ICD-10-CM | POA: Insufficient documentation

## 2011-12-23 DIAGNOSIS — M129 Arthropathy, unspecified: Secondary | ICD-10-CM

## 2011-12-23 DIAGNOSIS — IMO0002 Reserved for concepts with insufficient information to code with codable children: Secondary | ICD-10-CM | POA: Insufficient documentation

## 2011-12-23 DIAGNOSIS — F17200 Nicotine dependence, unspecified, uncomplicated: Secondary | ICD-10-CM | POA: Insufficient documentation

## 2011-12-23 MED ORDER — CYCLOBENZAPRINE HCL 10 MG PO TABS: 5.0000 mg | ORAL_TABLET | Freq: Two times a day (BID) | ORAL | Status: AC | PRN

## 2011-12-23 MED ORDER — KETOROLAC TROMETHAMINE 60 MG/2ML IM SOLN
60.0000 mg | Freq: Once | INTRAMUSCULAR | Status: AC
  Administered 2011-12-23: 60 mg via INTRAMUSCULAR
  Filled 2011-12-23: qty 2

## 2011-12-23 NOTE — Discharge Instructions (Signed)
Abrasions Abrasions are skin scrapes. Their treatment depends on how large and deep the abrasion is. Abrasions do not extend through all layers of the skin. A cut or lesion through all skin layers is called a laceration. HOME CARE INSTRUCTIONS   If you were given a dressing, change it at least once a day or as instructed by your caregiver. If the bandage sticks, soak it off with a solution of water or hydrogen peroxide.   Twice a day, wash the area with soap and water to remove all the cream/ointment. You may do this in a sink, under a tub faucet, or in a shower. Rinse off the soap and pat dry with a clean towel. Look for signs of infection (see below).   Reapply cream/ointment according to your caregiver's instruction. This will help prevent infection and keep the bandage from sticking. Telfa or gauze over the wound and under the dressing or wrap will also help keep the bandage from sticking.   If the bandage becomes wet, dirty, or develops a foul smell, change it as soon as possible.   Only take over-the-counter or prescription medicines for pain, discomfort, or fever as directed by your caregiver.  SEEK IMMEDIATE MEDICAL CARE IF:   Increasing pain in the wound.   Signs of infection develop: redness, swelling, surrounding area is tender to touch, or pus coming from the wound.   You have a fever.   Any foul smell coming from the wound or dressing.  Most skin wounds heal within ten days. Facial wounds heal faster. However, an infection may occur despite proper treatment. You should have the wound checked for signs of infection within 24 to 48 hours or sooner if problems arise. If you were not given a wound-check appointment, look closely at the wound yourself on the second day for early signs of infection listed above. MAKE SURE YOU:   Understand these instructions.   Will watch your condition.   Will get help right away if you are not doing well or get worse.  Document Released:  06/07/2005 Document Revised: 08/17/2011 Document Reviewed: 08/01/2011 [facility] Patient Information 2012 [place], [place].Back Pain, Adult Low back pain is very common. About 1 in 5 people have back pain.The cause of low back pain is rarely dangerous. The pain often gets better over time.About half of people with a sudden onset of back pain feel better in just 2 weeks. About 8 in 10 people feel better by 6 weeks.  CAUSES Some common causes of back pain include:  Strain of the muscles or ligaments supporting the spine.   Wear and tear (degeneration) of the spinal discs.   Arthritis.   Direct injury to the back.  DIAGNOSIS Most of the time, the direct cause of low back pain is not known.However, back pain can be treated effectively even when the exact cause of the pain is unknown.Answering your caregiver's questions about your overall health and symptoms is one of the most accurate ways to make sure the cause of your pain is not dangerous. If your caregiver needs more information, he or she may order lab work or imaging tests (X-rays or MRIs).However, even if imaging tests show changes in your back, this usually does not require surgery. HOME CARE INSTRUCTIONS For many people, back pain returns.Since low back pain is rarely dangerous, it is often a condition that people can learn to [facility] their own.   Remain active. It is stressful on the back to sit or stand in one place.  Do not sit, drive, or stand in one place for more than 30 minutes at a time. Take short walks on level surfaces as soon as pain allows.Try to increase the length of time you walk each day.   Do not stay in bed.Resting more than 1 or 2 days can delay your recovery.   Do not avoid exercise or work.Your body is made to move.It is not dangerous to be active, even though your back may hurt.Your back will likely heal faster if you return to being active before your pain is gone.   Pay attention to your body when you  bend and lift. Many people have less discomfortwhen lifting if they bend their knees, keep the load close to their bodies,and avoid twisting. Often, the most comfortable positions are those that put less stress on your recovering back.   Find a comfortable position to sleep. Use a firm mattress and lie on your side with your knees slightly bent. If you lie on your back, put a pillow under your knees.   Only take over-the-counter or prescription medicines as directed by your caregiver. Over-the-counter medicines to reduce pain and inflammation are often the most helpful.Your caregiver may prescribe muscle relaxant drugs.These medicines help dull your pain so you can more quickly return to your normal activities and healthy exercise.   Put ice on the injured area.   Put ice in a plastic bag.   Place a towel between your skin and the bag.   Leave the ice on for 15 to 20 minutes, 3 to 4 times a day for the first 2 to 3 days. After that, ice and heat may be alternated to reduce pain and spasms.   Ask your caregiver about trying back exercises and gentle massage. This may be of some benefit.   Avoid feeling anxious or stressed.Stress increases muscle tension and can worsen back pain.It is important to recognize when you are anxious or stressed and learn ways to manage it.Exercise is a great option.  SEEK MEDICAL CARE IF:  You have pain that is not relieved with rest or medicine.   You have pain that does not improve in 1 week.   You have new symptoms.   You are generally not feeling well.  SEEK IMMEDIATE MEDICAL CARE IF:   You have pain that radiates from your back into your legs.   You develop new bowel or bladder control problems.   You have unusual weakness or numbness in your arms or legs.   You develop nausea or vomiting.   You develop abdominal pain.   You feel faint.  Document Released: 08/28/2005 Document Revised: 08/17/2011 Document Reviewed: 01/16/2011 [facility]  Patient Information 2012 [place], [place].

## 2011-12-23 NOTE — ED Notes (Signed)
Pt states he was moving boxes, slipped and hit back on handrail along steps.  Pt c/o lower back pain.  No numbness or tingling in lower extremeties, no diff with elimination.

## 2011-12-23 NOTE — ED Notes (Signed)
patient took warm bath last night along with muscle relaxers & took tylenol, but back continues to hurt this morning

## 2011-12-23 NOTE — ED Provider Notes (Addendum)
History     CSN: [id number]  Arrival date & time 12/23/11  1028   First MD Initiated Contact with Patient 12/23/11 1058      Chief Complaint  Patient presents with  . Back Pain    (Consider location/radiation/quality/duration/timing/severity/associated sxs/prior treatment) HPI Patient complaining of pain after moving furniture yesterday and falling into railing.  Patient complaining of mid low back pain which is throbbing in nature at 8/10.  Worsens with laying on back.  Took hot bath and muscle relaxants and two 500 tylenol without relief.  Pain worse this a.m. On awakening.  No numbness, tingling, weakness, loss of bowel or bladder control.   Past Medical History  Diagnosis Date  . Kidney carcinoma     Past Surgical History  Procedure Date  . Nephrectomy   . Hernia repair     History reviewed. No pertinent family history.  History  Substance Use Topics  . Smoking status: Current Some Day Smoker -- 0.5 packs/day  . Smokeless tobacco: Not on file  . Alcohol Use: No      Review of Systems  All other systems reviewed and are negative.    Allergies  Review of patient's allergies indicates no known allergies.  Home Medications   Current Outpatient Rx  Name Route Sig Dispense Refill  . ACETAMINOPHEN 500 MG PO TABS Oral Take 1,000 mg by mouth every 6 (six) hours as needed.    . OXYCODONE-ACETAMINOPHEN 5-325 MG PO TABS Oral Take 1 tablet by mouth 2 (two) times daily.      BP 140/76  Pulse 79  Temp(Src) 98.1 F (36.7 C) (Oral)  Resp 20  Ht 5\' 9"  (1.753 m)  Wt 170 lb (77.111 kg)  BMI 25.10 kg/m2  SpO2 100%  Physical Exam  Nursing note and vitals reviewed. Constitutional: He is oriented to person, place, and time. He appears well-developed and well-nourished.  HENT:  Head: Normocephalic and atraumatic.  Right Ear: External ear normal.  Left Ear: External ear normal.  Nose: Nose normal.  Mouth/Throat: Oropharynx is clear and moist.  Eyes: Conjunctivae  and EOM are normal. Pupils are equal, round, and reactive to light.  Neck: Normal range of motion. Neck supple.  Cardiovascular: Normal rate, regular rhythm, normal heart sounds and intact distal pulses.   Pulmonary/Chest: Effort normal and breath sounds normal.  Abdominal: Soft. Bowel sounds are normal.  Musculoskeletal: Normal range of motion.       Arms: Neurological: He is alert and oriented to person, place, and time. He has normal reflexes.  Skin: Skin is warm and dry.  Psychiatric: He has a normal mood and affect. His behavior is normal. Thought content normal.    ED Course  Procedures (including critical care time)  Labs Reviewed - No data to display No results found.   No diagnosis found.    MDM  Dg Abd Acute W/chest  11/26/2011  *RADIOLOGY REPORT*  Clinical Data: Abdominal pain, status post right nephrectomy, rule out small bowel obstruction  ACUTE ABDOMEN SERIES (ABDOMEN 2 VIEW & CHEST 1 VIEW)  Comparison: 09/16/2011  Findings: Cardiomediastinal silhouette is unremarkable.  No acute infiltrate or pleural effusion.  No pulmonary edema.  Bony thorax is unremarkable.  There is nonspecific nonobstructive bowel gas pattern.  No free abdominal air is noted.  IMPRESSION:  1.  No acute disease. Nonspecific nonobstructive bowel gas pattern. No free abdominal air.  Original Report Authenticated By: Natasha Mead, M.D.        Hilario Quarry, MD  12/23/11 1211   Hilario Quarry, MD 12/24/11 [id number]

## 2012-02-08 ENCOUNTER — Encounter (HOSPITAL_BASED_OUTPATIENT_CLINIC_OR_DEPARTMENT_OTHER): Payer: Self-pay | Admitting: Family Medicine

## 2012-02-08 ENCOUNTER — Emergency Department (HOSPITAL_BASED_OUTPATIENT_CLINIC_OR_DEPARTMENT_OTHER)
Admission: EM | Admit: 2012-02-08 | Discharge: 2012-02-08 | Disposition: A | Discharge status: Home / Self Care | Payer: Managed Care, Other (non HMO) | Attending: Emergency Medicine | Admitting: Emergency Medicine

## 2012-02-08 DIAGNOSIS — X58XXXA Exposure to other specified factors, initial encounter: Secondary | ICD-10-CM | POA: Insufficient documentation

## 2012-02-08 DIAGNOSIS — S161XXA Strain of muscle, fascia and tendon at neck level, initial encounter: Secondary | ICD-10-CM

## 2012-02-08 DIAGNOSIS — S139XXA Sprain of joints and ligaments of unspecified parts of neck, initial encounter: Secondary | ICD-10-CM | POA: Insufficient documentation

## 2012-02-08 DIAGNOSIS — M542 Cervicalgia: Secondary | ICD-10-CM | POA: Insufficient documentation

## 2012-02-08 MED ORDER — OXYCODONE-ACETAMINOPHEN 10-325 MG PO TABS: 1.0000 | ORAL_TABLET | ORAL | Status: AC | PRN

## 2012-02-08 MED ORDER — BUPIVACAINE HCL 0.5 % IJ SOLN
INTRAMUSCULAR | Status: AC
  Administered 2012-02-08: 1 mL
  Filled 2012-02-08: qty 1

## 2012-02-08 MED ORDER — KETOROLAC TROMETHAMINE 30 MG/ML IJ SOLN
30.0000 mg | Freq: Once | INTRAMUSCULAR | Status: AC
  Administered 2012-02-08: 30 mg via INTRAMUSCULAR
  Filled 2012-02-08: qty 1

## 2012-02-08 NOTE — ED Notes (Signed)
bupivacaine placed at bedside for md, not administered by this rn.

## 2012-02-08 NOTE — Discharge Instructions (Signed)
Cervical Strain and Sprain (Whiplash) with Rehab Cervical strain and sprains are injuries that commonly occur with "whiplash" injuries. Whiplash occurs when the neck is forcefully whipped backward or forward, such as during a motor vehicle accident. The muscles, ligaments, tendons, discs and nerves of the neck are susceptible to injury when this occurs. SYMPTOMS   Pain or stiffness in the front and/or back of neck   Symptoms may present immediately or up to 24 hours after injury.   Dizziness, headache, nausea and vomiting.   Muscle spasm with soreness and stiffness in the neck.   Tenderness and swelling at the injury site.  CAUSES  Whiplash injuries often occur during contact sports or motor vehicle accidents.  RISK INCREASES WITH:  Osteoarthritis of the spine.   Situations that make head or neck accidents or trauma more likely.   High-risk sports (football, rugby, wrestling, hockey, auto racing, gymnastics, diving, contact karate or boxing).   Poor strength and flexibility of the neck.   Previous neck injury.   Poor tackling technique.   Improperly fitted or padded equipment.  PREVENTION  Learn and use proper technique (avoid tackling with the head, spearing and head-butting; use proper falling techniques to avoid landing on the head).   Warm up and stretch properly before activity.   Maintain physical fitness:   Strength, flexibility and endurance.   Cardiovascular fitness.   Wear properly fitted and padded protective equipment, such as padded soft collars, for participation in contact sports.  PROGNOSIS  Recovery for cervical strain and sprain injuries is dependent on the extent of the injury. These injuries are usually curable in 1 week to 3 months with appropriate treatment.  RELATED COMPLICATIONS   Temporary numbness and weakness may occur if the nerve roots are damaged, and this may persist until the nerve has completely healed.   Chronic pain due to frequent  recurrence of symptoms.   Prolonged healing, especially if activity is resumed too soon (before complete recovery).  TREATMENT  Treatment initially involves the use of ice and medication to help reduce pain and inflammation. It is also important to perform strengthening and stretching exercises and modify activities that worsen symptoms so the injury does not get worse. These exercises may be performed at home or with a therapist. For patients who experience severe symptoms, a soft padded collar may be recommended to be worn around the neck.  Improving your posture may help reduce symptoms. Posture improvement includes pulling your chin and abdomen in while sitting or standing. If you are sitting, sit in a firm chair with your buttocks against the back of the chair. While sleeping, try replacing your pillow with a small towel rolled to 2 inches in diameter, or use a cervical pillow or soft cervical collar. Poor sleeping positions delay healing.  For patients with nerve root damage, which causes numbness or weakness, the use of a cervical traction apparatus may be recommended. Surgery is rarely necessary for these injuries. However, cervical strain and sprains that are present at birth (congenital) may require surgery. MEDICATION   If pain medication is necessary, nonsteroidal anti-inflammatory medications, such as aspirin and ibuprofen, or other minor pain relievers, such as acetaminophen, are often recommended.   Do not take pain medication for 7 days before surgery.   Prescription pain relievers may be given if deemed necessary by your caregiver. Use only as directed and only as much as you need.  HEAT AND COLD:   Cold treatment (icing) relieves pain and reduces inflammation. Cold   treatment should be applied for 10 to 15 minutes every 2 to 3 hours for inflammation and pain and immediately after any activity that aggravates your symptoms. Use ice packs or an ice massage.   Heat treatment may be  used prior to performing the stretching and strengthening activities prescribed by your caregiver, physical therapist, or athletic trainer. Use a heat pack or a warm soak.  SEEK MEDICAL CARE IF:   Symptoms get worse or do not improve in 2 weeks despite treatment.   New, unexplained symptoms develop (drugs used in treatment may produce side effects).  EXERCISES RANGE OF MOTION (ROM) AND STRETCHING EXERCISES - Cervical Strain and Sprain These exercises may help you when beginning to rehabilitate your injury. In order to successfully resolve your symptoms, you must improve your posture. These exercises are designed to help reduce the forward-head and rounded-shoulder posture which contributes to this condition. Your symptoms may resolve with or without further involvement from your physician, physical therapist or athletic trainer. While completing these exercises, remember:   Restoring tissue flexibility helps normal motion to return to the joints. This allows healthier, less painful movement and activity.   An effective stretch should be held for at least 20 seconds, although you may need to begin with shorter hold times for comfort.   A stretch should never be painful. You should only feel a gentle lengthening or release in the stretched tissue.  STRETCH- Axial Extensors  Lie on your back on the floor. You may bend your knees for comfort. Place a rolled up hand towel or dish towel, about 2 inches in diameter, under the part of your head that makes contact with the floor.   Gently tuck your chin, as if trying to make a "double chin," until you feel a gentle stretch at the base of your head.   Hold __________ seconds.  Repeat __________ times. Complete this exercise __________ times per day.  STRETECH - Axial Extension   Stand or sit on a firm surface. Assume a good posture: chest up, shoulders drawn back, abdominal muscles slightly tense, knees unlocked (if standing) and feet hip width apart.    Slowly retract your chin so your head slides back and your chin slightly lowers.Continue to look straight ahead.   You should feel a gentle stretch in the back of your head. Be certain not to feel an aggressive stretch since this can cause headaches later.   Hold for __________ seconds.  Repeat __________ times. Complete this exercise __________ times per day. STRETCH - Cervical Side Bend   Stand or sit on a firm surface. Assume a good posture: chest up, shoulders drawn back, abdominal muscles slightly tense, knees unlocked (if standing) and feet hip width apart.   Without letting your nose or shoulders move, slowly tip your right / left ear to your shoulder until your feel a gentle stretch in the muscles on the opposite side of your neck.   Hold __________ seconds.  Repeat __________ times. Complete this exercise __________ times per day. STRETCH - Cervical Rotators   Stand or sit on a firm surface. Assume a good posture: chest up, shoulders drawn back, abdominal muscles slightly tense, knees unlocked (if standing) and feet hip width apart.   Keeping your eyes level with the ground, slowly turn your head until you feel a gentle stretch along the back and opposite side of your neck.   Hold __________ seconds.  Repeat __________ times. Complete this exercise __________ times per day. RANGE OF   MOTION - Neck Circles   Stand or sit on a firm surface. Assume a good posture: chest up, shoulders drawn back, abdominal muscles slightly tense, knees unlocked (if standing) and feet hip width apart.   Gently roll your head down and around from the back of one shoulder to the back of the other. The motion should never be forced or painful.   Repeat the motion 10-20 times, or until you feel the neck muscles relax and loosen.  Repeat __________ times. Complete the exercise __________ times per day. STRENGTHENING EXERCISES - Cervical Strain and Sprain These exercises may help you when beginning to  rehabilitate your injury. They may resolve your symptoms with or without further involvement from your physician, physical therapist or athletic trainer. While completing these exercises, remember:   Muscles can gain both the endurance and the strength needed for everyday activities through controlled exercises.   Complete these exercises as instructed by your physician, physical therapist or athletic trainer. Progress the resistance and repetitions only as guided.   You may experience muscle soreness or fatigue, but the pain or discomfort you are trying to eliminate should never worsen during these exercises. If this pain does worsen, stop and make certain you are following the directions exactly. If the pain is still present after adjustments, discontinue the exercise until you can discuss the trouble with your clinician.  STRENGTH - Cervical Flexors, Isometric  Face a wall, standing about 6 inches away. Place a small pillow, a ball about 6-8 inches in diameter, or a folded towel between your forehead and the wall.   Slightly tuck your chin and gently push your forehead into the soft object. Push only with mild to moderate intensity, building up tension gradually. Keep your jaw and forehead relaxed.   Hold 10 to 20 seconds. Keep your breathing relaxed.   Release the tension slowly. Relax your neck muscles completely before you start the next repetition.  Repeat __________ times. Complete this exercise __________ times per day. STRENGTH- Cervical Lateral Flexors, Isometric   Stand about 6 inches away from a wall. Place a small pillow, a ball about 6-8 inches in diameter, or a folded towel between the side of your head and the wall.   Slightly tuck your chin and gently tilt your head into the soft object. Push only with mild to moderate intensity, building up tension gradually. Keep your jaw and forehead relaxed.   Hold 10 to 20 seconds. Keep your breathing relaxed.   Release the tension  slowly. Relax your neck muscles completely before you start the next repetition.  Repeat __________ times. Complete this exercise __________ times per day. STRENGTH - Cervical Extensors, Isometric   Stand about 6 inches away from a wall. Place a small pillow, a ball about 6-8 inches in diameter, or a folded towel between the back of your head and the wall.   Slightly tuck your chin and gently tilt your head back into the soft object. Push only with mild to moderate intensity, building up tension gradually. Keep your jaw and forehead relaxed.   Hold 10 to 20 seconds. Keep your breathing relaxed.   Release the tension slowly. Relax your neck muscles completely before you start the next repetition.  Repeat __________ times. Complete this exercise __________ times per day. POSTURE AND BODY MECHANICS CONSIDERATIONS - Cervical Strain and Sprain Keeping correct posture when sitting, standing or completing your activities will reduce the stress put on different body tissues, allowing injured tissues a chance to heal   and limiting painful experiences. The following are general guidelines for improved posture. Your physician or physical therapist will provide you with any instructions specific to your needs. While reading these guidelines, remember:  The exercises prescribed by your provider will help you have the flexibility and strength to maintain correct postures.   The correct posture provides the optimal environment for your joints to work. All of your joints have less wear and tear when properly supported by a spine with good posture. This means you will experience a healthier, less painful body.   Correct posture must be practiced with all of your activities, especially prolonged sitting and standing. Correct posture is as important when doing repetitive low-stress activities (typing) as it is when doing a single heavy-load activity (lifting).  PROLONGED STANDING WHILE SLIGHTLY LEANING FORWARD When  completing a task that requires you to lean forward while standing in one place for a long time, place either foot up on a stationary 2-4 inch high object to help maintain the best posture. When both feet are on the ground, the low back tends to lose its slight inward curve. If this curve flattens (or becomes too large), then the back and your other joints will experience too much stress, fatigue more quickly and can cause pain.  RESTING POSITIONS Consider which positions are most painful for you when choosing a resting position. If you have pain with flexion-based activities (sitting, bending, stooping, squatting), choose a position that allows you to rest in a less flexed posture. You would want to avoid curling into a fetal position on your side. If your pain worsens with extension-based activities (prolonged standing, working overhead), avoid resting in an extended position such as sleeping on your stomach. Most people will find more comfort when they rest with their spine in a more neutral position, neither too rounded nor too arched. Lying on a non-sagging bed on your side with a pillow between your knees, or on your back with a pillow under your knees will often provide some relief. Keep in mind, being in any one position for a prolonged period of time, no matter how correct your posture, can still lead to stiffness. WALKING Walk with an upright posture. Your ears, shoulders and hips should all line-up. OFFICE WORK When working at a desk, create an environment that supports good, upright posture. Without extra support, muscles fatigue and lead to excessive strain on joints and other tissues. CHAIR:  A chair should be able to slide under your desk when your back makes contact with the back of the chair. This allows you to work closely.   The chair's height should allow your eyes to be level with the upper part of your monitor and your hands to be slightly lower than your elbows.   Body position:     Your feet should make contact with the floor. If this is not possible, use a foot rest.   Keep your ears over your shoulders. This will reduce stress on your neck and low back.  Document Released: 08/28/2005 Document Revised: 08/17/2011 Document Reviewed: 12/10/2008 [employer] Patient Information 2012 [employer]. 

## 2012-02-08 NOTE — ED Notes (Signed)
MD at bedside. 

## 2012-02-08 NOTE — ED Provider Notes (Signed)
History     CSN: [id number]  Arrival date & time 02/08/12  1050   First MD Initiated Contact with Patient 02/08/12 1109      Chief Complaint  Patient presents with  . Neck Pain    (Consider location/radiation/quality/duration/timing/severity/associated sxs/prior treatment) HPI The patient is a 51 yo man, history of multiple prior visits for pain, presenting with neck pain.  The patient notes a 3-day history of right neck pain, described as a dull ache, 10/10 in severity, with no inciting cause (trauma, exercise, heavy lifting).  Pain is worse when lying on the affected side, or with any movement of his neck.  Patient has with him a prescription for hydrocodone 7.5 and ibuprofen, which have not relieved the pain.  He notes no sensory loss or loss of motor function around the affected area.  Past Medical History  Diagnosis Date  . Kidney carcinoma     Past Surgical History  Procedure Date  . Nephrectomy   . Hernia repair     No family history on file.  History  Substance Use Topics  . Smoking status: Current Some Day Smoker -- 0.5 packs/day  . Smokeless tobacco: Not on file  . Alcohol Use: No      Review of Systems General: no fevers, chills, changes in weight, changes in appetite Skin: no rash HEENT: no blurry vision, hearing changes, sore throat Pulm: no dyspnea, coughing, wheezing CV: no chest pain, palpitations, shortness of breath Abd: no abdominal pain, nausea/vomiting, diarrhea/constipation GU: no dysuria, hematuria, polyuria Ext: no arthralgias, myalgias Neuro: no weakness, numbness, or tingling  Allergies  Review of patient's allergies indicates no known allergies.  Home Medications   Current Outpatient Rx  Name Route Sig Dispense Refill  . CLONAZEPAM PO Oral Take by mouth.    . ACETAMINOPHEN 500 MG PO TABS Oral Take 1,000 mg by mouth every 6 (six) hours as needed.    . OXYCODONE-ACETAMINOPHEN 10-325 MG PO TABS Oral Take 1 tablet by mouth every 4  (four) hours as needed for pain. 30 tablet 0  . OXYCODONE-ACETAMINOPHEN 5-325 MG PO TABS Oral Take 1 tablet by mouth 2 (two) times daily.      BP 134/101  Pulse 86  Temp(Src) 98.1 F (36.7 C) (Oral)  Resp 20  Ht 5\' 9"  (1.753 m)  Wt 170 lb (77.111 kg)  BMI 25.10 kg/m2  SpO2 98%  Physical Exam General: alert, cooperative, and in no apparent distress HEENT: pupils equal round and reactive to light, vision grossly intact, oropharynx clear and non-erythematous  Neck: tenderness to palpation of right paraspinal/trapezius muscle, and with any neck movement Lungs: clear to ascultation bilaterally, normal work of respiration, no wheezes, rales, ronchi Heart: regular rate and rhythm, no murmurs, gallops, or rubs Abdomen: soft, non-tender, non-distended, normal bowel sounds Extremities: no cyanosis, clubbing, or edema Neurologic: alert & oriented X3, cranial nerves II-XII intact, strength grossly intact, sensation intact to light touch  ED Course  Procedures (including critical care time)  Labs Reviewed - No data to display No results found.   1. Neck strain       MDM   # Neck muscle strain - the patient presents with neck pain, with tenderness to palpation of right trapezius/paraspinal muscle, unrelieved by hydrocodone 7.5. -will perform cervical bupivacaine injection -reassess after procedure  Procedure note: Cervical Bupivacaine Injection The patient's posterior neck was cleaned using alcohol, followed by chlorascrub.  Bupivacaine 0.5% (without epinephrine) was drawn into a 3 cc syringe.  C7 vertebrae  was palpated, and 1.5 cc was injected into the muscles to the right of C6/C7.  Another 1.5 cc was injected into the muscles to the left of C6/C7.  A bandage was applied to both injection sites.  The patient experienced no complications.  Addendum 1:00 pm - the patient experienced no relief of symptoms 30 minutes after injection.  The patient will be given a shot of toradol, and  discharged on oxycodone 10, instead of his home hydrocodone 7.5.  The patient has a follow-up appointment with his PCP in 2 weeks.   Linward Headland, MD 02/08/12 [phone]

## 2012-02-08 NOTE — ED Notes (Signed)
Pt c/o right lateral neck pain worse with movement. Pt taking hydrocodone and ibuprofen for same.

## 2012-02-09 NOTE — ED Provider Notes (Signed)
I saw and evaluated the patient, reviewed the resident's note and I agree with the findings and plan.   .Face to face Exam:  General:  Awake HEENT:  Atraumatic Resp:  Normal effort Abd:  Nondistended Neuro:No focal weakness Lymph: No adenopathy   Nelia Shi, MD 02/09/12 [phone]

## 2012-03-24 ENCOUNTER — Emergency Department (HOSPITAL_COMMUNITY)
Admission: EM | Admit: 2012-03-24 | Discharge: 2012-03-24 | Disposition: A | Discharge status: Home / Self Care | Payer: Managed Care, Other (non HMO) | Attending: Emergency Medicine | Admitting: Emergency Medicine

## 2012-03-24 ENCOUNTER — Encounter (HOSPITAL_COMMUNITY): Payer: Self-pay | Admitting: *Deleted

## 2012-03-24 DIAGNOSIS — Z85528 Personal history of other malignant neoplasm of kidney: Secondary | ICD-10-CM | POA: Insufficient documentation

## 2012-03-24 DIAGNOSIS — M79652 Pain in left thigh: Secondary | ICD-10-CM

## 2012-03-24 DIAGNOSIS — M79609 Pain in unspecified limb: Secondary | ICD-10-CM | POA: Insufficient documentation

## 2012-03-24 DIAGNOSIS — F17200 Nicotine dependence, unspecified, uncomplicated: Secondary | ICD-10-CM | POA: Insufficient documentation

## 2012-03-24 MED ORDER — KETOROLAC TROMETHAMINE 60 MG/2ML IM SOLN
30.0000 mg | Freq: Once | INTRAMUSCULAR | Status: AC
  Administered 2012-03-24: 30 mg via INTRAMUSCULAR
  Filled 2012-03-24: qty 2

## 2012-03-24 MED ORDER — OXYCODONE-ACETAMINOPHEN 5-325 MG PO TABS: 2.0000 | ORAL_TABLET | ORAL | Status: AC | PRN

## 2012-03-24 NOTE — ED Notes (Signed)
Patient is alert and oriented x3.  He is complaining of left leg pain that started about a month ago. The pain starts in his hip and radiates down to his foot.  Currently he rates his pain at a 3 of 10 with Tingling in the right lower extremity.

## 2012-03-24 NOTE — ED Provider Notes (Signed)
History     CSN: [id number]  Arrival date & time 03/24/12  4696   First MD Initiated Contact with Patient 03/24/12 [phone]      Chief Complaint  Patient presents with  . Leg Pain    left leg    (Consider location/radiation/quality/duration/timing/severity/associated sxs/prior treatment) Patient is a 51 y.o. male presenting with leg pain. The history is provided by the patient and medical records.  Leg Pain  Pertinent negatives include no numbness.  pt complains of pain on the lateral aspect of his left leg from his hip to just below.  The knee.  Denies trauma.  He denies fever, or rash, swelling, or weakness.  He states it is worse.  If he walks or if he lies on his left side.  He has tried Flexeril and got no relief in her symptoms  Past Medical History  Diagnosis Date  . Kidney carcinoma     Past Surgical History  Procedure Date  . Nephrectomy     right  . Hernia repair     History reviewed. No pertinent family history.  History  Substance Use Topics  . Smoking status: Current Some Day Smoker -- 0.5 packs/day  . Smokeless tobacco: Not on file  . Alcohol Use: No      Review of Systems  Constitutional: Negative for fever and chills.  Gastrointestinal: Negative for nausea and vomiting.  Musculoskeletal: Positive for myalgias. Negative for back pain and joint swelling.  Skin: Negative for rash.  Neurological: Negative for weakness and numbness.  Hematological: Does not bruise/bleed easily.  Psychiatric/Behavioral: Negative for confusion.  All other systems reviewed and are negative.    Allergies  Review of patient's allergies indicates no known allergies.  Home Medications   Current Outpatient Rx  Name Route Sig Dispense Refill  . ACETAMINOPHEN 500 MG PO TABS Oral Take 1,000 mg by mouth every 6 (six) hours as needed. For pain    . CLONAZEPAM PO Oral Take 1 tablet by mouth at bedtime.     . OXYCODONE-ACETAMINOPHEN 5-325 MG PO TABS Oral Take 1 tablet by mouth  2 (two) times daily.      BP 124/80  Pulse 65  Temp 97.6 F (36.4 C) (Oral)  Resp 16  Ht 5\' 9"  (1.753 m)  Wt 174 lb (78.926 kg)  BMI 25.70 kg/m2  SpO2 99%  Physical Exam  Nursing note and vitals reviewed. Constitutional: He is oriented to person, place, and time. He appears well-developed and well-nourished. No distress.  HENT:  Head: Normocephalic and atraumatic.  Eyes: Conjunctivae are normal.  Pulmonary/Chest: Effort normal.  Abdominal: He exhibits no distension.  Musculoskeletal: Normal range of motion. He exhibits no edema.       Tenderness along the left lateral leg overlying the fascia lata No edema, swelling, rash, color, change Strength 5 over 5 in the left lower extremity  Neurological: He is alert and oriented to person, place, and time.  Skin: Skin is warm and dry. No rash noted.  Psychiatric: He has a normal mood and affect. Thought content normal.    ED Course  Procedures (including critical care time)  Labs Reviewed - No data to display No results found.   No diagnosis found.    MDM  Left lateral thigh pain, with no history of trauma.  No weakness.  No rash or swelling        Cheri Guppy, MD 03/24/12 [phone]

## 2012-06-20 ENCOUNTER — Encounter (HOSPITAL_COMMUNITY): Payer: Self-pay | Admitting: Emergency Medicine

## 2012-06-20 ENCOUNTER — Emergency Department (HOSPITAL_COMMUNITY)
Admission: EM | Admit: 2012-06-20 | Discharge: 2012-06-20 | Disposition: A | Discharge status: Home / Self Care | Payer: Managed Care, Other (non HMO) | Attending: Emergency Medicine | Admitting: Emergency Medicine

## 2012-06-20 DIAGNOSIS — F1123 Opioid dependence with withdrawal: Secondary | ICD-10-CM

## 2012-06-20 DIAGNOSIS — F19239 Other psychoactive substance dependence with withdrawal, unspecified: Secondary | ICD-10-CM | POA: Insufficient documentation

## 2012-06-20 DIAGNOSIS — F1120 Opioid dependence, uncomplicated: Secondary | ICD-10-CM | POA: Insufficient documentation

## 2012-06-20 DIAGNOSIS — F19939 Other psychoactive substance use, unspecified with withdrawal, unspecified: Secondary | ICD-10-CM | POA: Insufficient documentation

## 2012-06-20 DIAGNOSIS — Z79899 Other long term (current) drug therapy: Secondary | ICD-10-CM | POA: Insufficient documentation

## 2012-06-20 DIAGNOSIS — R61 Generalized hyperhidrosis: Secondary | ICD-10-CM | POA: Insufficient documentation

## 2012-06-20 DIAGNOSIS — R109 Unspecified abdominal pain: Secondary | ICD-10-CM | POA: Insufficient documentation

## 2012-06-20 DIAGNOSIS — R197 Diarrhea, unspecified: Secondary | ICD-10-CM | POA: Insufficient documentation

## 2012-06-20 DIAGNOSIS — R079 Chest pain, unspecified: Secondary | ICD-10-CM | POA: Insufficient documentation

## 2012-06-20 DIAGNOSIS — R1110 Vomiting, unspecified: Secondary | ICD-10-CM | POA: Insufficient documentation

## 2012-06-20 DIAGNOSIS — IMO0001 Reserved for inherently not codable concepts without codable children: Secondary | ICD-10-CM | POA: Insufficient documentation

## 2012-06-20 DIAGNOSIS — F17200 Nicotine dependence, unspecified, uncomplicated: Secondary | ICD-10-CM | POA: Insufficient documentation

## 2012-06-20 LAB — RAPID URINE DRUG SCREEN, HOSP PERFORMED
Amphetamines: NOT DETECTED
Barbiturates: NOT DETECTED
Benzodiazepines: NOT DETECTED
Cocaine: NOT DETECTED
Opiates: NOT DETECTED
Tetrahydrocannabinol: NOT DETECTED

## 2012-06-20 LAB — CBC
HCT: 44.6 % (ref 39.0–52.0)
Hemoglobin: 15.3 g/dL (ref 13.0–17.0)
MCH: 30 pg (ref 26.0–34.0)
MCHC: 34.3 g/dL (ref 30.0–36.0)
MCV: 87.5 fL (ref 78.0–100.0)
Platelets: 274 10*3/uL (ref 150–400)
RBC: 5.1 MIL/uL (ref 4.22–5.81)
RDW: 12.9 % (ref 11.5–15.5)
WBC: 8.8 10*3/uL (ref 4.0–10.5)

## 2012-06-20 LAB — COMPREHENSIVE METABOLIC PANEL
ALT: 22 U/L (ref 0–53)
AST: 25 U/L (ref 0–37)
Albumin: 4.1 g/dL (ref 3.5–5.2)
Alkaline Phosphatase: 124 U/L — ABNORMAL HIGH (ref 39–117)
BUN: 13 mg/dL (ref 6–23)
CO2: 26 mEq/L (ref 19–32)
Calcium: 10.2 mg/dL (ref 8.4–10.5)
Chloride: 100 mEq/L (ref 96–112)
Creatinine, Ser: 1.07 mg/dL (ref 0.50–1.35)
GFR calc Af Amer: >90 mL/min (ref >90)
GFR calc non Af Amer: 79 mL/min — ABNORMAL LOW (ref >90)
Glucose, Bld: 84 mg/dL (ref 70–99)
Potassium: 4.3 mEq/L (ref 3.5–5.1)
Sodium: 139 mEq/L (ref 135–145)
Total Bilirubin: 0.3 mg/dL (ref 0.3–1.2)
Total Protein: 7.8 g/dL (ref 6.0–8.3)

## 2012-06-20 LAB — POCT I-STAT TROPONIN I: Troponin i, poc: 0 ng/mL (ref 0.00–0.08)

## 2012-06-20 LAB — URINALYSIS, ROUTINE W REFLEX MICROSCOPIC
Bilirubin Urine: NEGATIVE
Glucose, UA: NEGATIVE mg/dL
Hgb urine dipstick: NEGATIVE
Ketones, ur: NEGATIVE mg/dL
Leukocytes, UA: NEGATIVE
Nitrite: NEGATIVE
Protein, ur: NEGATIVE mg/dL
Specific Gravity, Urine: 1.002 — ABNORMAL LOW (ref 1.005–1.030)
Urobilinogen, UA: 0.2 mg/dL (ref 0.0–1.0)
pH: 6.5 (ref 5.0–8.0)

## 2012-06-20 LAB — LIPASE, BLOOD: Lipase: 72 U/L — ABNORMAL HIGH (ref 11–59)

## 2012-06-20 MED ORDER — LORAZEPAM 1 MG PO TABS: 1.0000 mg | ORAL_TABLET | Freq: Three times a day (TID) | ORAL | Status: DC | PRN

## 2012-06-20 MED ORDER — ONDANSETRON HCL 4 MG/2ML IJ SOLN
4.0000 mg | Freq: Once | INTRAMUSCULAR | Status: AC
  Administered 2012-06-20: 4 mg via INTRAVENOUS
  Filled 2012-06-20: qty 2

## 2012-06-20 MED ORDER — LORAZEPAM 2 MG/ML IJ SOLN
1.0000 mg | Freq: Once | INTRAMUSCULAR | Status: AC
  Administered 2012-06-20: 1 mg via INTRAVENOUS
  Filled 2012-06-20: qty 1

## 2012-06-20 MED ORDER — SODIUM CHLORIDE 0.9 % IV BOLUS (SEPSIS)
500.0000 mL | Freq: Once | INTRAVENOUS | Status: AC
  Administered 2012-06-20: 500 mL via INTRAVENOUS

## 2012-06-20 NOTE — ED Notes (Signed)
[id number]<AV> Expected date:<BR> Expected time:<BR> Means of arrival:<BR> Comments:<BR> Withdrawal from pain meds

## 2012-06-20 NOTE — ED Notes (Signed)
Pt states he ran out of symboxen, to get off percocet, for the past 6 days.  Pt states for the past couple of days he has been having increasing pain, emesis, diarrhea, diaphoresis and dizziness.  Pt states pain is worst in L side of chest and RUQ.  Pt states these are the normal places for him to hurt.  Lungs clear, nsr on monitor.

## 2012-06-20 NOTE — ED Provider Notes (Signed)
History     CSN: [id number]  Arrival date & time 06/20/12  4098   First MD Initiated Contact with Patient 06/20/12 [phone]      Chief Complaint  Patient presents with  . Chest Pain  . Abdominal Pain  . Emesis  . Diarrhea    (Consider location/radiation/quality/duration/timing/severity/associated sxs/prior treatment) Patient is a 51 y.o. male presenting with chest pain, abdominal pain, vomiting, and diarrhea.  Chest Pain Primary symptoms include abdominal pain and vomiting. Pertinent negatives for primary symptoms include no shortness of breath and no nausea.  Pertinent negatives for associated symptoms include no numbness and no weakness.    Abdominal Pain The primary symptoms of the illness include abdominal pain, vomiting and diarrhea. The primary symptoms of the illness do not include shortness of breath or nausea.  Symptoms associated with the illness do not include back pain.  Emesis  Associated symptoms include abdominal pain, diarrhea and myalgias. Pertinent negatives include no headaches.  Diarrhea The primary symptoms include abdominal pain, vomiting, diarrhea and myalgias. Primary symptoms do not include nausea or rash.  The myalgias are not associated with weakness.  The illness does not include back pain.   patient has pain all over nausea vomiting diarrhea and sweatiness. He also has left-sided chest and abdominal pain. Patient has had recent hand surgery. 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 He reportedly has been out of it for the last 6 days. The symptoms began 4 or 5 days ago. No fevers. No cough. Patient is been tearful per wife. No blood in stool. He states this is somewhat like his previous withdrawals, but has not usually been as severe.  Past Medical History  Diagnosis Date  . Kidney carcinoma     Past Surgical History  Procedure Date  . Nephrectomy     right  . Hernia repair   . Hand surgery     History reviewed. No pertinent  family history.  History  Substance Use Topics  . Smoking status: Current Some Day Smoker -- 0.5 packs/day  . Smokeless tobacco: Not on file  . Alcohol Use: No      Review of Systems  Constitutional: Negative for activity change and appetite change.  HENT: Negative for neck stiffness.   Eyes: Negative for pain.  Respiratory: Negative for chest tightness and shortness of breath.   Cardiovascular: Positive for chest pain. Negative for leg swelling.  Gastrointestinal: Positive for vomiting, abdominal pain and diarrhea. Negative for nausea.  Genitourinary: Negative for flank pain.  Musculoskeletal: Positive for myalgias. Negative for back pain.  Skin: Negative for rash.  Neurological: Negative for weakness, numbness and headaches.  Psychiatric/Behavioral: Positive for dysphoric mood. Negative for behavioral problems.    Allergies  Review of patient's allergies indicates no known allergies.  Home Medications   Current Outpatient Rx  Name Route Sig Dispense Refill  . CEPHALEXIN 500 MG PO CAPS Oral Take 500 mg by mouth 4 (four) times daily. x10 day. Pt on day 9    . LORAZEPAM 1 MG PO TABS Oral Take 1 tablet (1 mg total) by mouth 3 (three) times daily as needed for anxiety. 10 tablet 0    BP 132/84  Pulse 100  Temp 97.9 F (36.6 C) (Oral)  Resp 16  SpO2 100%  Physical Exam  Nursing note and vitals reviewed. Constitutional: He is oriented to person, place, and time. He appears well-developed and well-nourished.  HENT:  Head: Normocephalic and atraumatic.  Eyes: EOM  are normal. Pupils are equal, round, and reactive to light.  Neck: Normal range of motion. Neck supple.  Cardiovascular: Normal rate, regular rhythm and normal heart sounds.   No murmur heard. Pulmonary/Chest: Effort normal and breath sounds normal.  Abdominal: Soft. Bowel sounds are normal. He exhibits no distension and no mass. There is no tenderness. There is no rebound and no guarding.  Musculoskeletal:  Normal range of motion. He exhibits no edema.       Dressing on left ring finger and wrapped over hand and wrist.  Neurological: He is alert and oriented to person, place, and time. No cranial nerve deficit.  Skin: Skin is warm and dry.  Psychiatric:       Patient is somewhat tearful.    ED Course  Procedures (including critical care time)  Labs Reviewed  COMPREHENSIVE METABOLIC PANEL - Abnormal; Notable for the following:    Alkaline Phosphatase 124 (*)     GFR calc non Af Amer 79 (*)     All other components within normal limits  URINALYSIS, ROUTINE W REFLEX MICROSCOPIC - Abnormal; Notable for the following:    Specific Gravity, Urine 1.002 (*)     All other components within normal limits  LIPASE, BLOOD - Abnormal; Notable for the following:    Lipase 72 (*)     All other components within normal limits  CBC  URINE RAPID DRUG SCREEN (HOSP PERFORMED)  POCT I-STAT TROPONIN I   No results found.   1. Opioid withdrawal      Date: 06/20/2012  Rate: 95  Rhythm: normal sinus rhythm  QRS Axis: normal  Intervals: normal  ST/T Wave abnormalities: normal  Conduction Disutrbances:none  Narrative Interpretation:   Old EKG Reviewed: none available    MDM  Patient nausea vomiting diarrhea abdominal pain. Likely related to opioid withdrawal. Lipase is elevated but has been previously elevated with negative CTs. He's been off his Suboxone and feels better after treatment with Ativan. He'll be discharged home        Juliet Rude. Rubin Payor, MD 06/20/12 1609

## 2012-06-29 ENCOUNTER — Emergency Department (HOSPITAL_COMMUNITY)
Admission: EM | Admit: 2012-06-29 | Discharge: 2012-06-29 | Disposition: A | Discharge status: Home / Self Care | Payer: Worker's Compensation | Attending: Emergency Medicine | Admitting: Emergency Medicine

## 2012-06-29 ENCOUNTER — Encounter (HOSPITAL_COMMUNITY): Payer: Self-pay | Admitting: *Deleted

## 2012-06-29 DIAGNOSIS — M79609 Pain in unspecified limb: Secondary | ICD-10-CM | POA: Insufficient documentation

## 2012-06-29 DIAGNOSIS — F17200 Nicotine dependence, unspecified, uncomplicated: Secondary | ICD-10-CM | POA: Insufficient documentation

## 2012-06-29 DIAGNOSIS — IMO0002 Reserved for concepts with insufficient information to code with codable children: Secondary | ICD-10-CM | POA: Insufficient documentation

## 2012-06-29 DIAGNOSIS — Z85528 Personal history of other malignant neoplasm of kidney: Secondary | ICD-10-CM | POA: Insufficient documentation

## 2012-06-29 DIAGNOSIS — Y838 Other surgical procedures as the cause of abnormal reaction of the patient, or of later complication, without mention of misadventure at the time of the procedure: Secondary | ICD-10-CM | POA: Insufficient documentation

## 2012-06-29 DIAGNOSIS — T819XXA Unspecified complication of procedure, initial encounter: Secondary | ICD-10-CM

## 2012-06-29 MED ORDER — CEPHALEXIN 500 MG PO CAPS: 500.0000 mg | ORAL_CAPSULE | Freq: Four times a day (QID) | ORAL | Status: DC

## 2012-06-29 MED ORDER — TRAMADOL HCL 50 MG PO TABS: 50.0000 mg | ORAL_TABLET | Freq: Four times a day (QID) | ORAL | Status: DC | PRN

## 2012-06-29 NOTE — ED Provider Notes (Signed)
Medical screening examination/treatment/procedure(s) were performed by non-physician practitioner and as supervising physician I was immediately available for consultation/collaboration.  Sujata Maines, MD 06/29/12 1915 

## 2012-06-29 NOTE — ED Provider Notes (Signed)
History     CSN: [id number]  Arrival date & time 06/29/12  1637   First MD Initiated Contact with Patient 06/29/12 1725      Chief Complaint  Patient presents with  . Hand Pain    (Consider location/radiation/quality/duration/timing/severity/associated sxs/prior treatment) HPI Pt to the ER for left hand pain. Pt had work related injury to finger 2 weeks ago which required Dr. Orlan Leavens (hand) to do a skin graft from his palm to index finger. He says that the stitches were taken out just a couple of days ago but he has been having this throbbing pain to his hand. He feels as though the stitches are no longer in tact and may that is what is causing the pain. He denies havings any fevers, chills, weakness, diarrhea, nausea or vomiting. nad/vss   Past Medical History  Diagnosis Date  . Kidney carcinoma     Past Surgical History  Procedure Date  . Nephrectomy     right  . Hernia repair   . Hand surgery     History reviewed. No pertinent family history.  History  Substance Use Topics  . Smoking status: Current Some Day Smoker -- 0.5 packs/day  . Smokeless tobacco: Not on file  . Alcohol Use: No      Review of Systems  Review of Systems  Gen: no weight loss, fevers, chills, night sweats  Eyes: no discharge or drainage, no occular pain or visual changes  Nose: no epistaxis or rhinorrhea  Mouth: no dental pain, no sore throat  Neck: no neck pain  Lungs:No wheezing, coughing or hemoptysis CV: no chest pain, palpitations, dependent edema or orthopnea  Abd: no abdominal pain, nausea, vomiting  GU: no dysuria or gross hematuria  MSK:  Left hand pain Neuro: no headache, no focal neurologic deficits  Skin: no abnormalities Psyche: negative.   Allergies  Review of patient's allergies indicates no known allergies.  Home Medications   Current Outpatient Rx  Name Route Sig Dispense Refill  . LORAZEPAM 1 MG PO TABS Oral Take 1 tablet (1 mg total) by mouth 3 (three) times  daily as needed for anxiety. 10 tablet 0  . CEPHALEXIN 500 MG PO CAPS Oral Take 500 mg by mouth 4 (four) times daily. x10 day. Pt on day 9    . CEPHALEXIN 500 MG PO CAPS Oral Take 1 capsule (500 mg total) by mouth 4 (four) times daily. 20 capsule 0  . TRAMADOL HCL 50 MG PO TABS Oral Take 1 tablet (50 mg total) by mouth every 6 (six) hours as needed for pain. 15 tablet 0    BP 133/78  Pulse 95  Temp 98 F (36.7 C) (Oral)  Resp 18  SpO2 99%  Physical Exam  Nursing note and vitals reviewed. Constitutional: He appears well-developed and well-nourished. No distress.  HENT:  Head: Normocephalic and atraumatic.  Eyes: Pupils are equal, round, and reactive to light.  Neck: Normal range of motion. Neck supple.  Cardiovascular: Normal rate and regular rhythm.   Pulmonary/Chest: Effort normal.  Abdominal: Soft.  Musculoskeletal:       Pt has full ROM  Surgical wound appears to be healing well. Tip of finger is dark with bruising but is warm and blanches with good capillary refill. No puss expressed. The shaft of the finger does feel slightly erythematous  Neurological: He is alert.  Skin: Skin is warm and dry.    ED Course  Procedures (including critical care time)  Labs Reviewed -  No data to display No results found.   1. Post-operative complication       MDM  Pt here mainly for pain management. Surgical wound does not look infected, however his finger tip is erythematous. Will give Tradmadol, doxycyline. He is to see Dr. Orlan Leavens in two weeks and I am going to recommend he see him early this week instead.  Pt has been advised of the symptoms that warrant their return to the ED. Patient has voiced understanding and has agreed to follow-up with the PCP or specialist.       Dorthula Matas, PA 06/29/12 1750

## 2012-06-29 NOTE — ED Notes (Signed)
Pt c/o left hand pain from previous crush injury.

## 2012-09-30 ENCOUNTER — Emergency Department (HOSPITAL_COMMUNITY)
Admission: EM | Admit: 2012-09-30 | Discharge: 2012-09-30 | Disposition: A | Discharge status: Home / Self Care | Payer: Managed Care, Other (non HMO) | Attending: Emergency Medicine | Admitting: Emergency Medicine

## 2012-09-30 ENCOUNTER — Ambulatory Visit (HOSPITAL_COMMUNITY): Payer: Managed Care, Other (non HMO)

## 2012-09-30 ENCOUNTER — Encounter (HOSPITAL_COMMUNITY): Payer: Self-pay | Admitting: *Deleted

## 2012-09-30 DIAGNOSIS — F17200 Nicotine dependence, unspecified, uncomplicated: Secondary | ICD-10-CM | POA: Insufficient documentation

## 2012-09-30 DIAGNOSIS — R109 Unspecified abdominal pain: Secondary | ICD-10-CM

## 2012-09-30 DIAGNOSIS — Z85528 Personal history of other malignant neoplasm of kidney: Secondary | ICD-10-CM | POA: Insufficient documentation

## 2012-09-30 LAB — COMPREHENSIVE METABOLIC PANEL
ALT: 14 U/L (ref 0–53)
AST: 22 U/L (ref 0–37)
Albumin: 4.2 g/dL (ref 3.5–5.2)
Alkaline Phosphatase: 117 U/L (ref 39–117)
BUN: 12 mg/dL (ref 6–23)
CO2: 24 mEq/L (ref 19–32)
Calcium: 9.9 mg/dL (ref 8.4–10.5)
Chloride: 102 mEq/L (ref 96–112)
Creatinine, Ser: 1.12 mg/dL (ref 0.50–1.35)
GFR calc Af Amer: 86 mL/min — ABNORMAL LOW (ref >90)
GFR calc non Af Amer: 74 mL/min — ABNORMAL LOW (ref >90)
Glucose, Bld: 92 mg/dL (ref 70–99)
Potassium: 4.6 mEq/L (ref 3.5–5.1)
Sodium: 138 mEq/L (ref 135–145)
Total Bilirubin: 0.3 mg/dL (ref 0.3–1.2)
Total Protein: 7.7 g/dL (ref 6.0–8.3)

## 2012-09-30 LAB — CBC WITH DIFFERENTIAL/PLATELET
Basophils Absolute: 0 10*3/uL (ref 0.0–0.1)
Basophils Relative: 0 % (ref 0–1)
Eosinophils Absolute: 0.1 10*3/uL (ref 0.0–0.7)
Eosinophils Relative: 1 % (ref 0–5)
HCT: 46.9 % (ref 39.0–52.0)
Hemoglobin: 15.8 g/dL (ref 13.0–17.0)
Lymphocytes Relative: 21 % (ref 12–46)
Lymphs Abs: 2.8 10*3/uL (ref 0.7–4.0)
MCH: 30 pg (ref 26.0–34.0)
MCHC: 33.7 g/dL (ref 30.0–36.0)
MCV: 89.2 fL (ref 78.0–100.0)
Monocytes Absolute: 0.7 10*3/uL (ref 0.1–1.0)
Monocytes Relative: 6 % (ref 3–12)
Neutro Abs: 9.6 10*3/uL — ABNORMAL HIGH (ref 1.7–7.7)
Neutrophils Relative %: 73 % (ref 43–77)
Platelets: 305 10*3/uL (ref 150–400)
RBC: 5.26 MIL/uL (ref 4.22–5.81)
RDW: 12.6 % (ref 11.5–15.5)
WBC: 13.2 10*3/uL — ABNORMAL HIGH (ref 4.0–10.5)

## 2012-09-30 LAB — URINALYSIS, ROUTINE W REFLEX MICROSCOPIC
Bilirubin Urine: NEGATIVE
Glucose, UA: NEGATIVE mg/dL
Hgb urine dipstick: NEGATIVE
Ketones, ur: NEGATIVE mg/dL
Leukocytes, UA: NEGATIVE
Nitrite: NEGATIVE
Protein, ur: NEGATIVE mg/dL
Specific Gravity, Urine: 1.026 (ref 1.005–1.030)
Urobilinogen, UA: 1 mg/dL (ref 0.0–1.0)
pH: 6 (ref 5.0–8.0)

## 2012-09-30 LAB — LIPASE, BLOOD: Lipase: 79 U/L — ABNORMAL HIGH (ref 11–59)

## 2012-09-30 MED ORDER — ONDANSETRON HCL 4 MG/2ML IJ SOLN
4.0000 mg | Freq: Once | INTRAMUSCULAR | Status: AC
  Administered 2012-09-30: 4 mg via INTRAVENOUS
  Filled 2012-09-30: qty 2

## 2012-09-30 MED ORDER — IOHEXOL 300 MG/ML  SOLN
80.0000 mL | Freq: Once | INTRAMUSCULAR | Status: AC | PRN
  Administered 2012-09-30: 80 mL via INTRAVENOUS

## 2012-09-30 MED ORDER — ONDANSETRON HCL 4 MG PO TABS: 4.0000 mg | ORAL_TABLET | Freq: Four times a day (QID) | ORAL | Status: DC

## 2012-09-30 MED ORDER — HYDROMORPHONE HCL PF 1 MG/ML IJ SOLN
1.0000 mg | Freq: Once | INTRAMUSCULAR | Status: AC
  Administered 2012-09-30: 1 mg via INTRAVENOUS
  Filled 2012-09-30: qty 1

## 2012-09-30 MED ORDER — OXYCODONE-ACETAMINOPHEN 5-325 MG PO TABS: 2.0000 | ORAL_TABLET | ORAL | Status: DC | PRN

## 2012-09-30 NOTE — ED Provider Notes (Signed)
History  This chart was scribed for Joshua Octave, MD by Bennett Scrape, ED Scribe. This patient was seen in room WA07/WA07 and the patient's care was started at 2:53 PM.  CSN: [id number]  Arrival date & time 09/30/12  1139   First MD Initiated Contact with Patient 09/30/12 1453      Chief Complaint  Patient presents with  . Abdominal Pain    The history is provided by the patient. No language interpreter was used.    Joshua Lewis is a 52 y.o. male who presents to the Emergency Department complaining of one week of gradual onset, gradually worsening, constant RLQ abdominal pain described as sharp and throbbing that radiates into the right flank that started at work. He states that he is here for evaluation, because his boss wanted him evaluated. He reports that certain positions improve the pain and movement aggravates the pain. He states that he has a h/o a right nephrectomy done in 2012 at the [place] and states that the pain is in the same location as his surgery scar. He denies needing dialysis prior to the surgery at the time. He reports that he was seen before for the same "a long time ago" and treated with an unknown medication which resolved his symptoms. He reports that he was told the pain was from complications of the surgery. He states that he is CA free currently and has an appointment with urology in February for a routine f/u with urology. He has been taking Flexeril, tylenol and Advil with no improvement. He denies any recent lifting or twisting injuries or any recent falls. He states that he does a lot of lifting at work but doesn't believe that this is the cause. He reports that he has been drinking normally but eating less. He denies fevers, diarrhea, constipation, urinary symptoms and testicular pain as associated symptoms. He is a current everyday smoker but denies alcohol use.  Past Medical History  Diagnosis Date  . Kidney carcinoma     Past Surgical History  Procedure  Date  . Nephrectomy     right  . Hernia repair   . Hand surgery     No family history on file.  History  Substance Use Topics  . Smoking status: Current Some Day Smoker -- 0.5 packs/day  . Smokeless tobacco: Not on file  . Alcohol Use: No      Review of Systems  A complete 10 system review of systems was obtained and all systems are negative except as noted in the HPI and PMH.   Allergies  Review of patient's allergies indicates no known allergies.  Home Medications   Current Outpatient Rx  Name  Route  Sig  Dispense  Refill  . CYCLOBENZAPRINE HCL 5 MG PO TABS   Oral   Take 5 mg by mouth 3 (three) times daily as needed. Muscle spasms           Triage Vitals: BP 129/81  Pulse 94  Temp 98 F (36.7 C) (Oral)  Resp 16  SpO2 99%  Physical Exam  Nursing note and vitals reviewed. Constitutional: He is oriented to person, place, and time. He appears well-developed and well-nourished. No distress.  HENT:  Head: Normocephalic and atraumatic.  Mouth/Throat: Oropharynx is clear and moist.  Eyes: Conjunctivae normal and EOM are normal. Pupils are equal, round, and reactive to light.  Neck: Neck supple. No tracheal deviation present.  Cardiovascular: Normal rate and regular rhythm.   Pulmonary/Chest: Effort normal and  breath sounds normal. No respiratory distress.  Abdominal: Soft. There is tenderness (mild right-sided abdominal pain). There is no rebound and no guarding.       No CVA tenderness  Genitourinary:       No testicular tenderness  Musculoskeletal: Normal range of motion.  Neurological: He is alert and oriented to person, place, and time.  Skin: Skin is warm and dry.  Psychiatric: He has a normal mood and affect. His behavior is normal.    ED Course  Procedures (including critical care time)  DIAGNOSTIC STUDIES: Oxygen Saturation is 99% on room air, normal by my interpretation.    COORDINATION OF CARE: 3:10 PM-Discussed treatment plan which includes  pain and antiemetic medications, CT of abdomen, CBC panel, and UA with pt at bedside and pt agreed to plan.   3:15 PM- Ordered 1 mg Dilaudid and 4 mg Zofran injections  5:15 PM- Pt rechecked and reports mild pain currently. Informed pt of normal radiology and lab work. Advised pt that no sign no signs of CA, kidney stones or other potential problems.  Discussed discharge plan with pt and pt agreed.  Labs Reviewed  CBC WITH DIFFERENTIAL - Abnormal; Notable for the following:    WBC 13.2 (*)     Neutro Abs 9.6 (*)     All other components within normal limits  COMPREHENSIVE METABOLIC PANEL - Abnormal; Notable for the following:    GFR calc non Af Amer 74 (*)     GFR calc Af Amer 86 (*)     All other components within normal limits  LIPASE, BLOOD - Abnormal; Notable for the following:    Lipase 79 (*)     All other components within normal limits  URINALYSIS, ROUTINE W REFLEX MICROSCOPIC   Ct Abdomen Pelvis W Contrast  09/30/2012  *RADIOLOGY REPORT*  Clinical Data: Progressive right lower quadrant pain radiating to back.  Previous right nephrectomy for renal cell carcinoma.  CT ABDOMEN AND PELVIS WITH CONTRAST  Technique:  Multidetector CT imaging of the abdomen and pelvis was performed following the standard protocol during bolus administration of intravenous contrast.  Contrast: 80mL OMNIPAQUE IOHEXOL 300 MG/ML  SOLN  Comparison: 09/16/2011  Findings: Prior right nephrectomy again noted.  No soft tissue masses are seen in the right nephrectomy bed.  No evidence of retroperitoneal lymphadenopathy.  No contralateral left renal mass identified, and there is no evidence of hydronephrosis. No other soft tissue masses or lymphadenopathy seen within the abdomen or pelvis. Tiny left hepatic lobe cyst is stable but no liver masses are identified.  The gallbladder, pancreas, spleen, and adrenal glands are normal in appearance.  The appendix is not well visualized on this study, however there is no evidence  of inflammatory process in the area the cecum.  No other inflammatory process or abnormal fluid collections are seen within the abdomen or pelvis.  No evidence of bowel wall thickening or dilatation.  No hernia identified. No suspicious bone lesions identified.  IMPRESSION: Stable exam.  No acute findings or other significant abnormality identified.   Original Report Authenticated By: Myles Rosenthal, M.D.      No diagnosis found.    MDM  R sided abdominal pain x 1 week, worse with movement.  Hx R nephrectomy in 2012.  No nausea, vomiting, diarrhea, fever or urinary symptoms.  WBC 13. UA negative.  Mild lipase elevation similar to previous.  CT scan shows no acute pathology to explain patient's pain. No evidence of UTI or nephrolithiasis. No  evidence of recurrent carcinoma. We'll treat for nonspecific abdominal pain, possibly muscle skeletal. Followup with PCP this week. Return precautions discussed.  I personally performed the services described in this documentation, which was scribed in my presence. The recorded information has been reviewed and is accurate.    Joshua Octave, MD 09/30/12 1914

## 2012-09-30 NOTE — ED Notes (Signed)
Pt reports RLQ pain x 1 week. Hx of kidney removed on right side. Has been taking cyclobenzaprine without relief. Denies urinary difficulty. Pain worse with movement.

## 2012-11-06 ENCOUNTER — Emergency Department (HOSPITAL_BASED_OUTPATIENT_CLINIC_OR_DEPARTMENT_OTHER)
Admission: EM | Admit: 2012-11-06 | Discharge: 2012-11-06 | Disposition: A | Discharge status: Home / Self Care | Payer: Managed Care, Other (non HMO) | Attending: Emergency Medicine | Admitting: Emergency Medicine

## 2012-11-06 ENCOUNTER — Encounter (HOSPITAL_BASED_OUTPATIENT_CLINIC_OR_DEPARTMENT_OTHER): Payer: Self-pay | Admitting: *Deleted

## 2012-11-06 DIAGNOSIS — M25519 Pain in unspecified shoulder: Secondary | ICD-10-CM | POA: Insufficient documentation

## 2012-11-06 DIAGNOSIS — M719 Bursopathy, unspecified: Secondary | ICD-10-CM | POA: Insufficient documentation

## 2012-11-06 DIAGNOSIS — M67919 Unspecified disorder of synovium and tendon, unspecified shoulder: Secondary | ICD-10-CM | POA: Insufficient documentation

## 2012-11-06 DIAGNOSIS — M7581 Other shoulder lesions, right shoulder: Secondary | ICD-10-CM

## 2012-11-06 DIAGNOSIS — Z85528 Personal history of other malignant neoplasm of kidney: Secondary | ICD-10-CM | POA: Insufficient documentation

## 2012-11-06 DIAGNOSIS — F17200 Nicotine dependence, unspecified, uncomplicated: Secondary | ICD-10-CM | POA: Insufficient documentation

## 2012-11-06 MED ORDER — TRAMADOL HCL 50 MG PO TABS: 50.0000 mg | ORAL_TABLET | Freq: Four times a day (QID) | ORAL | Status: DC | PRN

## 2012-11-06 MED ORDER — NAPROXEN 500 MG PO TABS: 500.0000 mg | ORAL_TABLET | Freq: Two times a day (BID) | ORAL | Status: DC

## 2012-11-06 NOTE — ED Notes (Signed)
Patient states he has had right upper arm pain for the last 2 weeks.  No known injury.  Describes pain as a constant burning pain.  Has used some medications he had left over from a previous surgery.

## 2012-11-06 NOTE — ED Provider Notes (Signed)
History     CSN: [id number]  Arrival date & time 11/06/12  1138   First MD Initiated Contact with Patient 11/06/12 1155      Chief Complaint  Patient presents with  . Arm Pain    (Consider location/radiation/quality/duration/timing/severity/associated sxs/prior treatment) HPI Comments: Patient presents to the ER for evaluation of right upper arm and posterior shoulder pain for the last 2 weeks. Patient denies injury. He says that he has a constant burning pain, but occasionally has sharp stabbing increased pain in the area. It has caused him difficulty at his job. He had to leave work today. He has been taking leftover anti-inflammatory, muscle relaxer and pain pill without improvement. Patient reports that he did call his orthopedic surgeon he has an appointment scheduled for one week from today.  Patient is a 52 y.o. male presenting with arm pain.  Arm Pain    Past Medical History  Diagnosis Date  . Kidney carcinoma     Past Surgical History  Procedure Laterality Date  . Nephrectomy      right  . Hernia repair    . Hand surgery      No family history on file.  History  Substance Use Topics  . Smoking status: Current Some Day Smoker -- 0.50 packs/day  . Smokeless tobacco: Not on file  . Alcohol Use: No      Review of Systems  Musculoskeletal:       Right shoulder and arm pain    Allergies  Review of patient's allergies indicates no known allergies.  Home Medications   Current Outpatient Rx  Name  Route  Sig  Dispense  Refill  . cyclobenzaprine (FLEXERIL) 5 MG tablet   Oral   Take 5 mg by mouth 3 (three) times daily as needed. Muscle spasms         . ondansetron (ZOFRAN) 4 MG tablet   Oral   Take 1 tablet (4 mg total) by mouth every 6 (six) hours.   12 tablet   0   . oxyCODONE-acetaminophen (PERCOCET/ROXICET) 5-325 MG per tablet   Oral   Take 2 tablets by mouth every 4 (four) hours as needed for pain.   15 tablet   0     BP 140/88  Pulse  84  Temp(Src) 98.1 F (36.7 C) (Oral)  Resp 18  Ht 5\' 9"  (1.753 m)  Wt 175 lb (79.379 kg)  BMI 25.83 kg/m2  SpO2 100%  Physical Exam  Neck: Normal range of motion. Neck supple. No spinous process tenderness and no muscular tenderness present.  Musculoskeletal:       Right shoulder: He exhibits tenderness. He exhibits normal range of motion and no deformity.       Arms: Painful range of motion including raising arm above head, but able to perform all motion.  Area of maximal tenderness is posterior shoulder    ED Course  Procedures (including critical care time)  Labs Reviewed - No data to display No results found.   Diagnosis: Shoulder pain, possible rotator cuff tendinitis versus injury    MDM  Patient presents to the ER with complaints of pain in the right shoulder, mainly posterior to the shoulder. There was no injury. Examination reveals painful range of motion and tenderness in the area of the proximal humeral head posteriorly. This can be seen with rotator cuff tendinitis, but cannot rule out partial rupture. Patient already has followup scheduled with orthopedics. Treatment rest and analgesia. He was given a sling,  was counseled that he cannot wear the sling continuously, must perform range of motion exercises. He understands.       Gilda Crease, MD 11/06/12 1215

## 2012-11-26 ENCOUNTER — Other Ambulatory Visit: Payer: Self-pay | Admitting: Specialist

## 2012-11-26 DIAGNOSIS — S43431A Superior glenoid labrum lesion of right shoulder, initial encounter: Secondary | ICD-10-CM

## 2012-11-29 ENCOUNTER — Other Ambulatory Visit: Payer: Self-pay

## 2012-12-03 ENCOUNTER — Ambulatory Visit
Admission: RE | Admit: 2012-12-03 | Discharge: 2012-12-03 | Disposition: A | Discharge status: Home / Self Care | Payer: Managed Care, Other (non HMO) | Source: Ambulatory Visit | Attending: Specialist | Admitting: Specialist

## 2012-12-03 ENCOUNTER — Other Ambulatory Visit: Payer: Self-pay | Admitting: Specialist

## 2012-12-03 DIAGNOSIS — S43431A Superior glenoid labrum lesion of right shoulder, initial encounter: Secondary | ICD-10-CM

## 2012-12-03 MED ORDER — METHYLPREDNISOLONE ACETATE 40 MG/ML INJ SUSP (RADIOLOG: 120.0000 mg | Freq: Once | INTRAMUSCULAR | Status: DC

## 2012-12-04 ENCOUNTER — Other Ambulatory Visit: Payer: Managed Care, Other (non HMO)

## 2013-03-09 ENCOUNTER — Emergency Department (HOSPITAL_BASED_OUTPATIENT_CLINIC_OR_DEPARTMENT_OTHER)
Admission: EM | Admit: 2013-03-09 | Discharge: 2013-03-09 | Disposition: A | Discharge status: Home / Self Care | Payer: Managed Care, Other (non HMO) | Attending: Emergency Medicine | Admitting: Emergency Medicine

## 2013-03-09 ENCOUNTER — Encounter (HOSPITAL_BASED_OUTPATIENT_CLINIC_OR_DEPARTMENT_OTHER): Payer: Self-pay | Admitting: *Deleted

## 2013-03-09 ENCOUNTER — Emergency Department (HOSPITAL_BASED_OUTPATIENT_CLINIC_OR_DEPARTMENT_OTHER): Payer: Managed Care, Other (non HMO)

## 2013-03-09 DIAGNOSIS — S8990XA Unspecified injury of unspecified lower leg, initial encounter: Secondary | ICD-10-CM | POA: Insufficient documentation

## 2013-03-09 DIAGNOSIS — F17200 Nicotine dependence, unspecified, uncomplicated: Secondary | ICD-10-CM | POA: Insufficient documentation

## 2013-03-09 DIAGNOSIS — Z85528 Personal history of other malignant neoplasm of kidney: Secondary | ICD-10-CM | POA: Insufficient documentation

## 2013-03-09 DIAGNOSIS — R109 Unspecified abdominal pain: Secondary | ICD-10-CM | POA: Insufficient documentation

## 2013-03-09 DIAGNOSIS — Y9389 Activity, other specified: Secondary | ICD-10-CM | POA: Insufficient documentation

## 2013-03-09 DIAGNOSIS — W19XXXA Unspecified fall, initial encounter: Secondary | ICD-10-CM | POA: Insufficient documentation

## 2013-03-09 DIAGNOSIS — Y9229 Other specified public building as the place of occurrence of the external cause: Secondary | ICD-10-CM | POA: Insufficient documentation

## 2013-03-09 DIAGNOSIS — Z79899 Other long term (current) drug therapy: Secondary | ICD-10-CM | POA: Insufficient documentation

## 2013-03-09 LAB — CBC WITH DIFFERENTIAL/PLATELET
Basophils Absolute: 0 10*3/uL (ref 0.0–0.1)
Basophils Relative: 0 % (ref 0–1)
Eosinophils Absolute: 0.1 10*3/uL (ref 0.0–0.7)
Eosinophils Relative: 1 % (ref 0–5)
HCT: 46.4 % (ref 39.0–52.0)
Hemoglobin: 16.1 g/dL (ref 13.0–17.0)
Lymphocytes Relative: 20 % (ref 12–46)
Lymphs Abs: 2.2 10*3/uL (ref 0.7–4.0)
MCH: 30.3 pg (ref 26.0–34.0)
MCHC: 34.7 g/dL (ref 30.0–36.0)
MCV: 87.4 fL (ref 78.0–100.0)
Monocytes Absolute: 0.8 10*3/uL (ref 0.1–1.0)
Monocytes Relative: 7 % (ref 3–12)
Neutro Abs: 7.6 10*3/uL (ref 1.7–7.7)
Neutrophils Relative %: 71 % (ref 43–77)
Platelets: 226 10*3/uL (ref 150–400)
RBC: 5.31 MIL/uL (ref 4.22–5.81)
RDW: 12.7 % (ref 11.5–15.5)
WBC: 10.7 10*3/uL — ABNORMAL HIGH (ref 4.0–10.5)

## 2013-03-09 LAB — URINALYSIS, ROUTINE W REFLEX MICROSCOPIC
Bilirubin Urine: NEGATIVE
Glucose, UA: NEGATIVE mg/dL
Hgb urine dipstick: NEGATIVE
Ketones, ur: NEGATIVE mg/dL
Leukocytes, UA: NEGATIVE
Nitrite: NEGATIVE
Protein, ur: NEGATIVE mg/dL
Specific Gravity, Urine: 1.013 (ref 1.005–1.030)
Urobilinogen, UA: 1 mg/dL (ref 0.0–1.0)
pH: 6 (ref 5.0–8.0)

## 2013-03-09 LAB — BASIC METABOLIC PANEL
BUN: 17 mg/dL (ref 6–23)
CO2: 24 mEq/L (ref 19–32)
Calcium: 9.9 mg/dL (ref 8.4–10.5)
Chloride: 100 mEq/L (ref 96–112)
Creatinine, Ser: 1.3 mg/dL (ref 0.50–1.35)
GFR calc Af Amer: 71 mL/min — ABNORMAL LOW (ref >90)
GFR calc non Af Amer: 62 mL/min — ABNORMAL LOW (ref >90)
Glucose, Bld: 95 mg/dL (ref 70–99)
Potassium: 3.7 mEq/L (ref 3.5–5.1)
Sodium: 137 mEq/L (ref 135–145)

## 2013-03-09 MED ORDER — ONDANSETRON HCL 4 MG/2ML IJ SOLN
4.0000 mg | Freq: Once | INTRAMUSCULAR | Status: AC
  Administered 2013-03-09: 4 mg via INTRAVENOUS
  Filled 2013-03-09: qty 2

## 2013-03-09 MED ORDER — MORPHINE SULFATE 4 MG/ML IJ SOLN
4.0000 mg | Freq: Once | INTRAMUSCULAR | Status: AC
  Administered 2013-03-09: 4 mg via INTRAVENOUS
  Filled 2013-03-09: qty 1

## 2013-03-09 MED ORDER — HYDROCODONE-ACETAMINOPHEN 5-325 MG PO TABS: 2.0000 | ORAL_TABLET | ORAL | Status: DC | PRN

## 2013-03-09 NOTE — ED Notes (Signed)
Left flank pain x 4 days- right nephrectomy due to CA in 2012-

## 2013-03-09 NOTE — ED Provider Notes (Signed)
History    This chart was scribed for Joshua Bucco, MD, MD by Ashley Jacobs, ED Scribe. The patient was seen in room MH04/MH04 and the patient's care was started at 3:50 PM CSN: [id number] Arrival date & time 03/09/13  1441    Chief Complaint  Patient presents with  . Flank Pain    The history is provided by the patient and medical records. No language interpreter was used.   HPI Comments: Joshua Lewis is a 52 y.o. male who presents to the Emergency Department complaining of severe, lower left flank pain that has been constant pain for the past week. Pt fell to knees in church today due to pain. Pt reports being  a veteran and unable to see [facility] until Friday. Pt reports having right kidney removed years ago due to CA. Pt reports that pain is worsen by movement.  Pt denies hematuria, fever, chills, nausea, vomiting, diarrhea, weakness, cough, irregular BM, SOB and any other pain. Pt reports going to restroom regularly with no trouble. Pt reports taking prednisone without relief of pain.     Past Medical History  Diagnosis Date  . Kidney carcinoma    Past Surgical History  Procedure Laterality Date  . Nephrectomy      right  . Hernia repair    . Hand surgery     No family history on file. History  Substance Use Topics  . Smoking status: Current Some Day Smoker -- 0.50 packs/day  . Smokeless tobacco: Never Used  . Alcohol Use: No    Review of Systems  Constitutional: Negative for fever, chills, diaphoresis and fatigue.  HENT: Negative for congestion, rhinorrhea and sneezing.   Eyes: Negative.   Respiratory: Negative for cough, chest tightness and shortness of breath.   Cardiovascular: Negative for chest pain and leg swelling.  Gastrointestinal: Positive for abdominal pain. Negative for nausea, vomiting, diarrhea and blood in stool.  Genitourinary: Negative for frequency, hematuria, flank pain and difficulty urinating.  Musculoskeletal: Negative for back pain and arthralgias.   Skin: Negative for rash.  Neurological: Negative for dizziness, speech difficulty, weakness, numbness and headaches.    Allergies  Review of patient's allergies indicates no known allergies.  Home Medications   Current Outpatient Rx  Name  Route  Sig  Dispense  Refill  . cyclobenzaprine (FLEXERIL) 5 MG tablet   Oral   Take 5 mg by mouth 3 (three) times daily as needed. Muscle spasms         . HYDROcodone-acetaminophen (NORCO/VICODIN) 5-325 MG per tablet   Oral   Take 2 tablets by mouth every 4 (four) hours as needed for pain.   15 tablet   0   . naproxen (NAPROSYN) 500 MG tablet   Oral   Take 1 tablet (500 mg total) by mouth 2 (two) times daily.   30 tablet   0   . ondansetron (ZOFRAN) 4 MG tablet   Oral   Take 1 tablet (4 mg total) by mouth every 6 (six) hours.   12 tablet   0   . oxyCODONE-acetaminophen (PERCOCET/ROXICET) 5-325 MG per tablet   Oral   Take 2 tablets by mouth every 4 (four) hours as needed for pain.   15 tablet   0   . traMADol (ULTRAM) 50 MG tablet   Oral   Take 1 tablet (50 mg total) by mouth every 6 (six) hours as needed for pain.   15 tablet   0    BP 116/99  Pulse  78  Temp(Src) 98.6 F (37 C) (Oral)  Resp 16  Ht 5\' 9"  (1.753 m)  Wt 170 lb (77.111 kg)  BMI 25.09 kg/m2  SpO2 100% Physical Exam  Nursing note and vitals reviewed. Constitutional: He is oriented to person, place, and time. He appears well-developed and well-nourished.  HENT:  Head: Normocephalic and atraumatic.  Eyes: Pupils are equal, round, and reactive to light.  Neck: Normal range of motion. Neck supple.  Cardiovascular: Normal rate, regular rhythm and normal heart sounds.   Pulmonary/Chest: Effort normal and breath sounds normal. No respiratory distress. He has no wheezes. He has no rales. He exhibits no tenderness.  Abdominal: Soft. Bowel sounds are normal. There is tenderness. There is no rebound and no guarding.  moderate tenderness to left mid and lower  abdomen  Left CVA tenderness  Genitourinary:  No genitial, testicles, or inguinal  pain.   Musculoskeletal: Normal range of motion. He exhibits no edema.     Lymphadenopathy:    He has no cervical adenopathy.  Neurological: He is alert and oriented to person, place, and time.  Skin: Skin is warm and dry. No rash noted.  Psychiatric: He has a normal mood and affect.    ED Course  Procedures (including critical care time) DIAGNOSTIC STUDIES: Oxygen Saturation is 100% on room air, normal by my interpretation.    COORDINATION OF CARE: 3:55 PM Discussed ED treatment with pt and pt agrees.   Results for orders placed during the hospital encounter of 03/09/13  URINALYSIS, ROUTINE W REFLEX MICROSCOPIC      Result Value Range   Color, Urine YELLOW  YELLOW   APPearance CLEAR  CLEAR   Specific Gravity, Urine 1.013  1.005 - 1.030   pH 6.0  5.0 - 8.0   Glucose, UA NEGATIVE  NEGATIVE mg/dL   Hgb urine dipstick NEGATIVE  NEGATIVE   Bilirubin Urine NEGATIVE  NEGATIVE   Ketones, ur NEGATIVE  NEGATIVE mg/dL   Protein, ur NEGATIVE  NEGATIVE mg/dL   Urobilinogen, UA 1.0  0.0 - 1.0 mg/dL   Nitrite NEGATIVE  NEGATIVE   Leukocytes, UA NEGATIVE  NEGATIVE  CBC WITH DIFFERENTIAL      Result Value Range   WBC 10.7 (*) 4.0 - 10.5 K/uL   RBC 5.31  4.22 - 5.81 MIL/uL   Hemoglobin 16.1  13.0 - 17.0 g/dL   HCT 16.1  09.6 - 04.5 %   MCV 87.4  78.0 - 100.0 fL   MCH 30.3  26.0 - 34.0 pg   MCHC 34.7  30.0 - 36.0 g/dL   RDW 40.9  81.1 - 91.4 %   Platelets 226  150 - 400 K/uL   Neutrophils Relative % 71  43 - 77 %   Neutro Abs 7.6  1.7 - 7.7 K/uL   Lymphocytes Relative 20  12 - 46 %   Lymphs Abs 2.2  0.7 - 4.0 K/uL   Monocytes Relative 7  3 - 12 %   Monocytes Absolute 0.8  0.1 - 1.0 K/uL   Eosinophils Relative 1  0 - 5 %   Eosinophils Absolute 0.1  0.0 - 0.7 K/uL   Basophils Relative 0  0 - 1 %   Basophils Absolute 0.0  0.0 - 0.1 K/uL  BASIC METABOLIC PANEL      Result Value Range   Sodium 137   135 - 145 mEq/L   Potassium 3.7  3.5 - 5.1 mEq/L   Chloride 100  96 - 112 mEq/L  CO2 24  19 - 32 mEq/L   Glucose, Bld 95  70 - 99 mg/dL   BUN 17  6 - 23 mg/dL   Creatinine, Ser 2.95  0.50 - 1.35 mg/dL   Calcium 9.9  8.4 - 62.1 mg/dL   GFR calc non Af Amer 62 (*) >90 mL/min   GFR calc Af Amer 71 (*) >90 mL/min   Ct Abdomen Pelvis Wo Contrast  03/09/2013   *RADIOLOGY REPORT*  Clinical Data: Left flank pain for 4 days.  History of right nephrectomy for renal cell carcinoma in 2012.  CT ABDOMEN AND PELVIS WITHOUT CONTRAST  Technique:  Multidetector CT imaging of the abdomen and pelvis was performed following the standard protocol without intravenous contrast.  Comparison: CT 09/30/2012.  Findings: The lung bases are clear.  There is no pleural or pericardial effusion.  As evaluated in the noncontrast state, the left kidney appears normal without hydronephrosis, focal abnormality or perinephric soft tissue stranding.  There is no evidence of renal, ureteral or bladder calculus.  The right nephrectomy bed has a stable appearance without mass.  Both adrenal glands appear normal.  A small low-density lesion in the left hepatic lobe on image 9 is stable.  The spleen, gallbladder and pancreas appear normal.  No inflammatory changes or enlarged lymph nodes are identified. The appendix appears normal.  There is stable mild enlargement of the prostate gland.  There are no acute or worrisome osseous findings.  There are stable degenerative changes of the right hip with subchondral cyst anteriorly in the femoral head.  IMPRESSION:  1.  No acute abdominal pelvic findings demonstrated.  No evidence of urinary tract calculus or hydronephrosis. 2.  No evidence of metastatic disease.   Original Report Authenticated By: Carey Bullocks, M.D.      1. Abdominal pain     MDM  PT with left flank pain.  No finding on CT to suggest etiology.  Will give short course of pain meds.  Pt has appt with his PMD at the [place] on  Friday.  Advised to return if symptoms worsen  I personally performed the services described in this documentation, which was scribed in my presence.  The recorded information has been reviewed and considered.       Joshua Bucco, MD 03/09/13 1750

## 2013-03-18 ENCOUNTER — Emergency Department (HOSPITAL_COMMUNITY): Payer: Managed Care, Other (non HMO)

## 2013-03-18 ENCOUNTER — Emergency Department (HOSPITAL_COMMUNITY)
Admission: EM | Admit: 2013-03-18 | Discharge: 2013-03-18 | Disposition: A | Discharge status: Home / Self Care | Payer: Managed Care, Other (non HMO) | Attending: Emergency Medicine | Admitting: Emergency Medicine

## 2013-03-18 ENCOUNTER — Encounter (HOSPITAL_COMMUNITY): Payer: Self-pay

## 2013-03-18 DIAGNOSIS — F17200 Nicotine dependence, unspecified, uncomplicated: Secondary | ICD-10-CM | POA: Insufficient documentation

## 2013-03-18 DIAGNOSIS — M542 Cervicalgia: Secondary | ICD-10-CM | POA: Insufficient documentation

## 2013-03-18 DIAGNOSIS — M5412 Radiculopathy, cervical region: Secondary | ICD-10-CM | POA: Insufficient documentation

## 2013-03-18 DIAGNOSIS — Z87448 Personal history of other diseases of urinary system: Secondary | ICD-10-CM | POA: Insufficient documentation

## 2013-03-18 MED ORDER — METHYLPREDNISOLONE 4 MG PO KIT: PACK | ORAL | Status: DC

## 2013-03-18 MED ORDER — MORPHINE SULFATE 4 MG/ML IJ SOLN
4.0000 mg | Freq: Once | INTRAMUSCULAR | Status: AC
  Administered 2013-03-18: 4 mg via INTRAMUSCULAR
  Filled 2013-03-18: qty 1

## 2013-03-18 MED ORDER — KETOROLAC TROMETHAMINE 60 MG/2ML IM SOLN
30.0000 mg | Freq: Once | INTRAMUSCULAR | Status: AC
  Administered 2013-03-18: 30 mg via INTRAMUSCULAR
  Filled 2013-03-18: qty 2

## 2013-03-18 MED ORDER — OXYCODONE-ACETAMINOPHEN 5-325 MG PO TABS: 2.0000 | ORAL_TABLET | ORAL | Status: DC | PRN

## 2013-03-18 NOTE — ED Provider Notes (Signed)
Medical screening examination/treatment/procedure(s) were performed by non-physician practitioner and as supervising physician I was immediately available for consultation/collaboration.  Asyia Hornung, MD 03/18/13 1239 

## 2013-03-18 NOTE — ED Notes (Signed)
Patient transported to X-ray 

## 2013-03-18 NOTE — ED Provider Notes (Signed)
History    CSN: [id number] Arrival date & time 03/18/13  5784  First MD Initiated Contact with Patient 03/18/13 0805     Chief Complaint  Patient presents with  . Shoulder Pain   (Consider location/radiation/quality/duration/timing/severity/associated sxs/prior Treatment) HPI  Gerhard Rappaport is a 52 y.o. male complaining of pain to the right neck radiating down the right arm worsening with movement over the course of the last 2 days. Pain is described as burning. Patient's cat chronic issues with his right shoulder but never has the left. He denies trauma, numbness, weakness, paresthesia.  Past Medical History  Diagnosis Date  . Kidney carcinoma    Past Surgical History  Procedure Laterality Date  . Nephrectomy      right  . Hernia repair    . Hand surgery     No family history on file. History  Substance Use Topics  . Smoking status: Current Some Day Smoker -- 0.50 packs/day  . Smokeless tobacco: Never Used  . Alcohol Use: No    Review of Systems  Constitutional:       Negative except as described in HPI  HENT:       Negative except as described in HPI  Respiratory:       Negative except as described in HPI  Cardiovascular:       Negative except as described in HPI  Gastrointestinal:       Negative except as described in HPI  Genitourinary:       Negative except as described in HPI  Musculoskeletal:       Negative except as described in HPI  Skin:       Negative except as described in HPI  Neurological:       Negative except as described in HPI  All other systems reviewed and are negative.    Allergies  Review of patient's allergies indicates no known allergies.  Home Medications   Current Outpatient Rx  Name  Route  Sig  Dispense  Refill  . cyclobenzaprine (FLEXERIL) 5 MG tablet   Oral   Take 5 mg by mouth 3 (three) times daily as needed. Muscle spasms         . HYDROcodone-acetaminophen (NORCO/VICODIN) 5-325 MG per tablet   Oral   Take 2  tablets by mouth every 4 (four) hours as needed for pain.   15 tablet   0   . naproxen (NAPROSYN) 500 MG tablet   Oral   Take 1 tablet (500 mg total) by mouth 2 (two) times daily.   30 tablet   0   . ondansetron (ZOFRAN) 4 MG tablet   Oral   Take 1 tablet (4 mg total) by mouth every 6 (six) hours.   12 tablet   0   . oxyCODONE-acetaminophen (PERCOCET/ROXICET) 5-325 MG per tablet   Oral   Take 2 tablets by mouth every 4 (four) hours as needed for pain.   15 tablet   0   . traMADol (ULTRAM) 50 MG tablet   Oral   Take 1 tablet (50 mg total) by mouth every 6 (six) hours as needed for pain.   15 tablet   0    BP 148/91  Pulse 95  Temp(Src) 97.6 F (36.4 C) (Oral)  Resp 20  SpO2 100% Physical Exam  Nursing note and vitals reviewed. Constitutional: He is oriented to person, place, and time. He appears well-developed and well-nourished. No distress.  HENT:  Head: Normocephalic.  Mouth/Throat: Oropharynx is  clear and moist.  Eyes: Conjunctivae and EOM are normal.  Neck: Normal range of motion.  Cardiovascular: Normal rate and regular rhythm.   Pulmonary/Chest: Effort normal and breath sounds normal. No stridor. No respiratory distress. He has no wheezes. He has no rales. He exhibits no tenderness.  Abdominal: Soft.  Musculoskeletal: Normal range of motion.  Neurological: He is alert and oriented to person, place, and time.  Spurling maneuver positive on the left side. Strength is 5 out of 5 at biceps, triceps and grip strength bilaterally in the upper extremities. Distal sensation is grossly intact and can differentiate between pinprick and light touch.  Psychiatric: He has a normal mood and affect.    ED Course  Procedures (including critical care time) Labs Reviewed - No data to display Dg Cervical Spine Complete  03/18/2013   *RADIOLOGY REPORT*  Clinical Data: Posterior and left neck pain  CERVICAL SPINE - COMPLETE 4+ VIEW  Comparison: 12/24/2010 head CT  Findings:  Multilevel degenerative changes, most pronounced at C5-6. Multilevel facet arthropathy.  No displaced fracture or dislocation. Grade 1 anterolisthesis of C4 on C5 and C7 on T1. Right greater than left mid and lower cervical neural foraminal narrowing.  Lung apices clear.  Maintained C1-2 articulation.  No dens fracture.  IMPRESSION: Multilevel degenerative changes, right greater than left neural foraminal narrowing, and grade 1 anterolisthesis of C4 on C5 and C7 on T1 as above.   Original Report Authenticated By: Jearld Lesch, M.D.   1. Cervical radiculopathy     MDM   Filed Vitals:   03/18/13 0801 03/18/13 0939  BP: 148/91 126/83  Pulse: 95 69  Temp: 97.6 F (36.4 C)   TempSrc: Oral   Resp: 20 20  SpO2: 100% 99%     Marquan Vokes is a 53 y.o. male with cervical radiculopathy, no signs of impingement or cord compression. Strength and sensation are equal bilaterally. We'll give him a Medrol Dosepak in addition to Percocet for pain control. I have asked him to follow with neurosurgery for further management.  Medications  morphine 4 MG/ML injection 4 mg (4 mg Intramuscular Given 03/18/13 0841)  ketorolac (TORADOL) injection 30 mg (30 mg Intramuscular Given 03/18/13 0841)    Pt is hemodynamically stable, appropriate for, and amenable to discharge at this time. Pt verbalized understanding and agrees with care plan. Outpatient follow-up and specific return precautions discussed.    Discharge Medication List as of 03/18/2013  9:29 AM    START taking these medications   Details  methylPREDNISolone (MEDROL DOSEPAK) 4 MG tablet As directed by package insert, Print    oxyCODONE-acetaminophen (PERCOCET/ROXICET) 5-325 MG per tablet Take 2 tablets by mouth every 4 (four) hours as needed for pain., Starting 03/18/2013, Until Discontinued, [employer], PA-C 03/18/13 1100

## 2013-03-18 NOTE — ED Notes (Signed)
Pt c/o severe lt shoulder pain radiating to arm on movement x2days, denies injury; states has had fluid drawn off rt shoulder 8months ago

## 2013-07-14 ENCOUNTER — Emergency Department (HOSPITAL_BASED_OUTPATIENT_CLINIC_OR_DEPARTMENT_OTHER)
Admission: EM | Admit: 2013-07-14 | Discharge: 2013-07-14 | Disposition: A | Discharge status: Home / Self Care | Payer: Managed Care, Other (non HMO) | Attending: Emergency Medicine | Admitting: Emergency Medicine

## 2013-07-14 ENCOUNTER — Emergency Department (HOSPITAL_BASED_OUTPATIENT_CLINIC_OR_DEPARTMENT_OTHER): Payer: Managed Care, Other (non HMO)

## 2013-07-14 ENCOUNTER — Encounter (HOSPITAL_BASED_OUTPATIENT_CLINIC_OR_DEPARTMENT_OTHER): Payer: Self-pay | Admitting: Emergency Medicine

## 2013-07-14 DIAGNOSIS — Z79899 Other long term (current) drug therapy: Secondary | ICD-10-CM | POA: Insufficient documentation

## 2013-07-14 DIAGNOSIS — G43909 Migraine, unspecified, not intractable, without status migrainosus: Secondary | ICD-10-CM | POA: Insufficient documentation

## 2013-07-14 DIAGNOSIS — Y939 Activity, unspecified: Secondary | ICD-10-CM | POA: Insufficient documentation

## 2013-07-14 DIAGNOSIS — Y929 Unspecified place or not applicable: Secondary | ICD-10-CM | POA: Insufficient documentation

## 2013-07-14 DIAGNOSIS — W1809XA Striking against other object with subsequent fall, initial encounter: Secondary | ICD-10-CM | POA: Insufficient documentation

## 2013-07-14 DIAGNOSIS — F17200 Nicotine dependence, unspecified, uncomplicated: Secondary | ICD-10-CM | POA: Insufficient documentation

## 2013-07-14 DIAGNOSIS — Z85528 Personal history of other malignant neoplasm of kidney: Secondary | ICD-10-CM | POA: Insufficient documentation

## 2013-07-14 DIAGNOSIS — F0781 Postconcussional syndrome: Secondary | ICD-10-CM | POA: Insufficient documentation

## 2013-07-14 DIAGNOSIS — R519 Headache, unspecified: Secondary | ICD-10-CM

## 2013-07-14 LAB — CBC WITH DIFFERENTIAL/PLATELET
Basophils Absolute: 0 10*3/uL (ref 0.0–0.1)
Basophils Relative: 1 % (ref 0–1)
Eosinophils Absolute: 0.1 10*3/uL (ref 0.0–0.7)
Eosinophils Relative: 2 % (ref 0–5)
HCT: 42.9 % (ref 39.0–52.0)
Hemoglobin: 14.5 g/dL (ref 13.0–17.0)
Lymphocytes Relative: 29 % (ref 12–46)
Lymphs Abs: 2.4 10*3/uL (ref 0.7–4.0)
MCH: 29.8 pg (ref 26.0–34.0)
MCHC: 33.8 g/dL (ref 30.0–36.0)
MCV: 88.1 fL (ref 78.0–100.0)
Monocytes Absolute: 0.7 10*3/uL (ref 0.1–1.0)
Monocytes Relative: 8 % (ref 3–12)
Neutro Abs: 5.2 10*3/uL (ref 1.7–7.7)
Neutrophils Relative %: 62 % (ref 43–77)
Platelets: 235 10*3/uL (ref 150–400)
RBC: 4.87 MIL/uL (ref 4.22–5.81)
RDW: 12.6 % (ref 11.5–15.5)
WBC: 8.5 10*3/uL (ref 4.0–10.5)

## 2013-07-14 LAB — COMPREHENSIVE METABOLIC PANEL WITH GFR
ALT: 9 U/L (ref 0–53)
AST: 17 U/L (ref 0–37)
Albumin: 4 g/dL (ref 3.5–5.2)
Alkaline Phosphatase: 105 U/L (ref 39–117)
BUN: 17 mg/dL (ref 6–23)
CO2: 24 meq/L (ref 19–32)
Calcium: 9.6 mg/dL (ref 8.4–10.5)
Chloride: 102 meq/L (ref 96–112)
Creatinine, Ser: 1.4 mg/dL — ABNORMAL HIGH (ref 0.50–1.35)
GFR calc Af Amer: 65 mL/min — ABNORMAL LOW
GFR calc non Af Amer: 56 mL/min — ABNORMAL LOW
Glucose, Bld: 97 mg/dL (ref 70–99)
Potassium: 4.1 meq/L (ref 3.5–5.1)
Sodium: 137 meq/L (ref 135–145)
Total Bilirubin: 0.3 mg/dL (ref 0.3–1.2)
Total Protein: 7.5 g/dL (ref 6.0–8.3)

## 2013-07-14 LAB — ACETAMINOPHEN LEVEL: Acetaminophen (Tylenol), Serum: <15 ug/mL (ref 10–30)

## 2013-07-14 LAB — SEDIMENTATION RATE: Sed Rate: 2 mm/h (ref 0–16)

## 2013-07-14 LAB — SALICYLATE LEVEL: Salicylate Lvl: <2 mg/dL — ABNORMAL LOW (ref 2.8–20.0)

## 2013-07-14 MED ORDER — GABAPENTIN 300 MG PO CAPS: 300.0000 mg | ORAL_CAPSULE | Freq: Three times a day (TID) | ORAL | Status: DC

## 2013-07-14 MED ORDER — SODIUM CHLORIDE 0.9 % IV BOLUS (SEPSIS)
1000.0000 mL | Freq: Once | INTRAVENOUS | Status: AC
  Administered 2013-07-14: 1000 mL via INTRAVENOUS

## 2013-07-14 MED ORDER — SUMATRIPTAN SUCCINATE 100 MG PO TABS: 100.0000 mg | ORAL_TABLET | ORAL | Status: DC | PRN

## 2013-07-14 MED ORDER — ONDANSETRON HCL 4 MG PO TABS: 4.0000 mg | ORAL_TABLET | Freq: Four times a day (QID) | ORAL | Status: DC

## 2013-07-14 MED ORDER — MORPHINE SULFATE 4 MG/ML IJ SOLN
4.0000 mg | Freq: Once | INTRAMUSCULAR | Status: AC
  Administered 2013-07-14: 4 mg via INTRAVENOUS
  Filled 2013-07-14: qty 1

## 2013-07-14 MED ORDER — DIPHENHYDRAMINE HCL 50 MG/ML IJ SOLN
25.0000 mg | Freq: Once | INTRAMUSCULAR | Status: AC
  Administered 2013-07-14: 25 mg via INTRAVENOUS
  Filled 2013-07-14: qty 1

## 2013-07-14 MED ORDER — PREDNISONE 50 MG PO TABS
60.0000 mg | ORAL_TABLET | Freq: Once | ORAL | Status: AC
  Administered 2013-07-14: 60 mg via ORAL
  Filled 2013-07-14 (×2): qty 1

## 2013-07-14 MED ORDER — KETOROLAC TROMETHAMINE 30 MG/ML IJ SOLN
30.0000 mg | Freq: Once | INTRAMUSCULAR | Status: AC
  Administered 2013-07-14: 30 mg via INTRAVENOUS
  Filled 2013-07-14: qty 1

## 2013-07-14 MED ORDER — METOCLOPRAMIDE HCL 5 MG/ML IJ SOLN
10.0000 mg | Freq: Once | INTRAMUSCULAR | Status: AC
  Administered 2013-07-14: 10 mg via INTRAVENOUS
  Filled 2013-07-14: qty 2

## 2013-07-14 MED ORDER — TETRACAINE HCL 0.5 % OP SOLN
2.0000 [drp] | Freq: Once | OPHTHALMIC | Status: AC
  Administered 2013-07-14: 2 [drp] via OPHTHALMIC
  Filled 2013-07-14: qty 2

## 2013-07-14 NOTE — ED Notes (Signed)
Pt reports falling a week ago hitting his (L) side of his head.  Pt remembers falling and was able to get up on his own.  Pt reports that since that time he has had an intermittent HA on the (L) side of his head.A/O x 4. No distress noted.

## 2013-07-14 NOTE — ED Notes (Signed)
Will call back to get order for neurology

## 2013-07-14 NOTE — ED Provider Notes (Signed)
CSN: [id number]     Arrival date & time 07/14/13  1103 History   First MD Initiated Contact with Patient 07/14/13 1119     Chief Complaint  Patient presents with  . Migraine   (Consider location/radiation/quality/duration/timing/severity/associated sxs/prior Treatment) HPI Comments: Patient presents with L sided headache that has been intermittent but severe for the past week.  It started after he fell in the shower 1 week ago and struck the L side of his head.  Denies LOC or vomiting.  Headache is gradual in onset and always on the L side of his head and face. Associated with tearing on the left side of his face. He denies any focal weakness, numbness or tingling. He denies any visual changes. Denies any fevers. Denies any vomiting. Denies any previous history of headaches. He's been taking Excedrin Migraine at home without relief. He took 15 tablets over the weekend. The headache comes and goes and it is time that he is headache free but it recurred several times a day lasting for hours at a time. Denies thunderclap onset. Denies any fevers or recent travel.  The history is provided by the patient.    Past Medical History  Diagnosis Date  . Kidney carcinoma    Past Surgical History  Procedure Laterality Date  . Nephrectomy      right  . Hernia repair    . Hand surgery     History reviewed. No pertinent family history. History  Substance Use Topics  . Smoking status: Current Some Day Smoker -- 0.50 packs/day  . Smokeless tobacco: Never Used  . Alcohol Use: No    Review of Systems  Constitutional: Negative for fever, activity change and appetite change.  Eyes: Negative for photophobia, pain and visual disturbance.  Respiratory: Negative for chest tightness.   Cardiovascular: Negative for chest pain.  Gastrointestinal: Positive for nausea. Negative for vomiting and abdominal pain.  Genitourinary: Negative for dysuria.  Musculoskeletal: Negative for back pain, neck pain and neck  stiffness.  Skin: Negative for rash.  Neurological: Positive for headaches. Negative for dizziness and light-headedness.  A complete 10 system review of systems was obtained and all systems are negative except as noted in the HPI and PMH.    Allergies  Review of patient's allergies indicates no known allergies.  Home Medications   Current Outpatient Rx  Name  Route  Sig  Dispense  Refill  . acetaminophen (TYLENOL) 500 MG tablet   Oral   Take 500 mg by mouth every 6 (six) hours as needed for pain.         Marland Kitchen gabapentin (NEURONTIN) 300 MG capsule   Oral   Take 1 capsule (300 mg total) by mouth 3 (three) times daily.   21 capsule   0   . ibuprofen (ADVIL,MOTRIN) 200 MG tablet   Oral   Take 200 mg by mouth every 6 (six) hours as needed for pain.         Marland Kitchen ondansetron (ZOFRAN) 4 MG tablet   Oral   Take 1 tablet (4 mg total) by mouth every 6 (six) hours.   12 tablet   0   . SUMAtriptan (IMITREX) 100 MG tablet   Oral   Take 1 tablet (100 mg total) by mouth every 2 (two) hours as needed for migraine. May repeat in 2 hours if headache persists or recurs.   5 tablet   0    BP 142/80  Pulse 74  Temp(Src) 97.5 F (36.4 C) (Oral)  Resp 18  Ht 5\' 9"  (1.753 m)  Wt 170 lb (77.111 kg)  BMI 25.09 kg/m2  SpO2 99% Physical Exam  Constitutional: He is oriented to person, place, and time. He appears well-developed and well-nourished. No distress.  HENT:  Head: Normocephalic and atraumatic.  Mouth/Throat: Oropharynx is clear and moist. No oropharyngeal exudate.  No temporal artery tenderness  Eyes: Conjunctivae and EOM are normal. Pupils are equal, round, and reactive to light.  IOP OS 22, IOP OD 23  Neck: Normal range of motion. Neck supple.  No meningismus  Cardiovascular: Normal rate, regular rhythm and normal heart sounds.   No murmur heard. Pulmonary/Chest: Effort normal and breath sounds normal. No respiratory distress.  Abdominal: Soft. There is no tenderness. There  is no rebound and no guarding.  Musculoskeletal: Normal range of motion. He exhibits no edema and no tenderness.  Neurological: He is alert and oriented to person, place, and time. No cranial nerve deficit. He exhibits normal muscle tone. Coordination normal.  CN 2-12 intact, no ataxia on finger to nose, no nystagmus, 5/5 strength throughout, no pronator drift, Romberg negative, normal gait.   Skin: Skin is warm.    ED Course  Procedures (including critical care time) Labs Review Labs Reviewed  COMPREHENSIVE METABOLIC PANEL - Abnormal; Notable for the following:    Creatinine, Ser 1.40 (*)    GFR calc non Af Amer 56 (*)    GFR calc Af Amer 65 (*)    All other components within normal limits  SALICYLATE LEVEL - Abnormal; Notable for the following:    Salicylate Lvl <2.0 (*)    All other components within normal limits  SEDIMENTATION RATE  CBC WITH DIFFERENTIAL  ACETAMINOPHEN LEVEL   Imaging Review Ct Head Wo Contrast  07/14/2013   CLINICAL DATA:  Headache and light sensitivity. The questionable trauma last week.  EXAM: CT HEAD WITHOUT CONTRAST  TECHNIQUE: Contiguous axial images were obtained from the base of the skull through the vertex without intravenous contrast.  COMPARISON:  12/24/2010  FINDINGS: The ventricles, cisterns and other CSF spaces are within normal. There is no mass, mass effect, shift of midline structures or acute hemorrhage. There is no mass, mass effect, shift of midline structures or acute hemorrhage. There is no evidence to suggest acute ischemia. Remaining bony structures are within normal. There is a small mucous retention cyst versus polyp over the left maxillary sinus.  IMPRESSION: No acute intracranial findings.  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   Electronically Signed   By: Elberta Fortis M.D.   On: 07/14/2013 12:24    EKG Interpretation   None       MDM   1. Headache   2. Post concussive syndrome    1 week history of  intermittent severe left-sided headaches that come on after hitting head in the shower. No loss of consciousness. No vision problems, no vomiting or fever.  Concern for concussion, cluster headache, temporal arteritis, glaucoma, migraine, intracranial bleeding.  CT head is negative. Intraocular pressures are not elevated. PO prednisone given.  Suspect patient's headaches could represent cluster headaches or migraine. Postconcussive headache is also a possibility. CT scan is negative for acute traumatic injury. Intraocular pressures are normal. Sedimentation rate is normal. Doubt glaucoma, doubt temporal arteritis.  With patient's relatively young age of 1, ESR of 2, no temporal artery tenderness, doubt temporal arteritis. D/w Dr. Amada Jupiter of neurology who agrees.  He recommends gabapentin and treatment for probable post concussive headache.  Headache was improved with migraine cocktail in the ED as well as oxygen. Patient feels ready to go home. We'll refer to neurology. Ambulatory and tolerating PO in the ED without vomiting. Return precautions discussed.  BP 142/80  Pulse 74  Temp(Src) 97.5 F (36.4 C) (Oral)  Resp 18  Ht 5\' 9"  (1.753 m)  Wt 170 lb (77.111 kg)  BMI 25.09 kg/m2  SpO2 99%   Glynn Octave, MD 07/14/13 1520

## 2013-07-27 ENCOUNTER — Encounter (HOSPITAL_COMMUNITY): Payer: Self-pay | Admitting: Emergency Medicine

## 2013-07-27 ENCOUNTER — Emergency Department (HOSPITAL_COMMUNITY)
Admission: EM | Admit: 2013-07-27 | Discharge: 2013-07-27 | Disposition: A | Discharge status: Home / Self Care | Payer: Managed Care, Other (non HMO) | Attending: Emergency Medicine | Admitting: Emergency Medicine

## 2013-07-27 DIAGNOSIS — K002 Abnormalities of size and form of teeth: Secondary | ICD-10-CM | POA: Insufficient documentation

## 2013-07-27 DIAGNOSIS — F17200 Nicotine dependence, unspecified, uncomplicated: Secondary | ICD-10-CM | POA: Insufficient documentation

## 2013-07-27 DIAGNOSIS — Z85528 Personal history of other malignant neoplasm of kidney: Secondary | ICD-10-CM | POA: Insufficient documentation

## 2013-07-27 DIAGNOSIS — Z79899 Other long term (current) drug therapy: Secondary | ICD-10-CM | POA: Insufficient documentation

## 2013-07-27 DIAGNOSIS — K0889 Other specified disorders of teeth and supporting structures: Secondary | ICD-10-CM

## 2013-07-27 DIAGNOSIS — K089 Disorder of teeth and supporting structures, unspecified: Secondary | ICD-10-CM | POA: Insufficient documentation

## 2013-07-27 MED ORDER — PENICILLIN V POTASSIUM 500 MG PO TABS: 500.0000 mg | ORAL_TABLET | Freq: Four times a day (QID) | ORAL | Status: AC

## 2013-07-27 MED ORDER — HYDROCODONE-ACETAMINOPHEN 5-325 MG PO TABS: 1.0000 | ORAL_TABLET | Freq: Four times a day (QID) | ORAL | Status: DC | PRN

## 2013-07-27 NOTE — ED Provider Notes (Signed)
CSN: [id number]     Arrival date & time 07/27/13  8295 History   First MD Initiated Contact with Patient 07/27/13 [phone]     Chief Complaint  Patient presents with  . Dental Pain   (Consider location/radiation/quality/duration/timing/severity/associated sxs/prior Treatment) HPI Comments: Patient presents today with pain of his left upper gingiva.  He reports that the pain has been present for a couple of weeks.  He was seen by a dentist Dr. Everardo Beals on 07/19/13 and had the tooth at that site extracted.  He reports that the pain has been worse since the extraction.  Pain is constant.  He has been taking Tramadol for the pain, which he does not feel is helping.  He states that he attempted to call the dentist, but was told that he could not be seen due to an outstanding bill.  He denies difficulty swallowing, fever, chills, facial swelling, nausea, or vomiting.    The history is provided by the patient.    Past Medical History  Diagnosis Date  . Kidney carcinoma    Past Surgical History  Procedure Laterality Date  . Nephrectomy      right  . Hernia repair    . Hand surgery     No family history on file. History  Substance Use Topics  . Smoking status: Current Some Day Smoker -- 0.50 packs/day  . Smokeless tobacco: Never Used  . Alcohol Use: No    Review of Systems  HENT: Positive for dental problem.   All other systems reviewed and are negative.    Allergies  Review of patient's allergies indicates no known allergies.  Home Medications   Current Outpatient Rx  Name  Route  Sig  Dispense  Refill  . acetaminophen (TYLENOL) 500 MG tablet   Oral   Take 500 mg by mouth every 6 (six) hours as needed for pain.         Marland Kitchen gabapentin (NEURONTIN) 300 MG capsule   Oral   Take 1 capsule (300 mg total) by mouth 3 (three) times daily.   21 capsule   0   . ibuprofen (ADVIL,MOTRIN) 200 MG tablet   Oral   Take 200 mg by mouth every 6 (six) hours as needed for pain.         Marland Kitchen  ondansetron (ZOFRAN) 4 MG tablet   Oral   Take 1 tablet (4 mg total) by mouth every 6 (six) hours.   12 tablet   0   . SUMAtriptan (IMITREX) 100 MG tablet   Oral   Take 1 tablet (100 mg total) by mouth every 2 (two) hours as needed for migraine. May repeat in 2 hours if headache persists or recurs.   5 tablet   0    BP 133/96  Pulse 73  Temp(Src) 97.9 F (36.6 C) (Oral)  Resp 18  SpO2 100% Physical Exam  Nursing note and vitals reviewed. Constitutional: He is oriented to person, place, and time. He appears well-developed and well-nourished. No distress.  HENT:  Head: Normocephalic and atraumatic.  Mouth/Throat: Uvula is midline, oropharynx is clear and moist and mucous membranes are normal. No trismus in the jaw. Abnormal dentition. No dental abscesses or uvula swelling. No oropharyngeal exudate, posterior oropharyngeal edema, posterior oropharyngeal erythema or tonsillar abscesses.    Site of previous dental extraction visualized.  No drainage from the area.  No swelling.  Area tender to palpation.  Pt able to open and close mouth with out difficulty. Airway intact. Uvula  midline. . No swelling or tenderness of submental and submandibular regions.  No tongue elevation or sublingual tenderness to palpation.  Eyes: Conjunctivae and EOM are normal.  Neck: Normal range of motion and full passive range of motion without pain. Neck supple.  Cardiovascular: Normal rate, regular rhythm and normal heart sounds.   Pulmonary/Chest: Effort normal and breath sounds normal. No stridor. No respiratory distress. He has no wheezes.  Musculoskeletal: Normal range of motion.  Lymphadenopathy:       Head (right side): No submental, no submandibular, no tonsillar, no preauricular and no posterior auricular adenopathy present.       Head (left side): No submental, no submandibular, no tonsillar, no preauricular and no posterior auricular adenopathy present.    He has no cervical adenopathy.    Neurological: He is alert and oriented to person, place, and time.  Skin: Skin is warm and dry. No rash noted. He is not diaphoretic.  Psychiatric: He has a normal mood and affect.    ED Course  Procedures (including critical care time) Labs Review Labs Reviewed - No data to display Imaging Review No results found.  EKG Interpretation   None       MDM  No diagnosis found. Patient with pain at the site of his previous tooth extraction.  Tooth extracted 8 days ago.   No gross abscess.  Exam unconcerning for Ludwig's angina or spread of infection.  Will treat with penicillin and pain medicine.  Urged patient to follow-up with dentist.       Santiago Glad, PA-C 07/27/13 1151

## 2013-07-27 NOTE — ED Notes (Signed)
Pt states he had a tooth extraction last week, was prescribed tramadol - states pain has become progressively worse over past 2 days, pain radiates to left side of face. States he has not been able to eat in 2 days. Pt has tried Nurse, learning disability, but states they will not see him because his bill is not completely paid yet.

## 2013-07-31 NOTE — ED Provider Notes (Signed)
Medical screening examination/treatment/procedure(s) were performed by non-physician practitioner and as supervising physician I was immediately available for consultation/collaboration.  EKG Interpretation   None        Raeford Razor, MD 07/31/13 0041

## 2013-10-26 ENCOUNTER — Emergency Department (HOSPITAL_BASED_OUTPATIENT_CLINIC_OR_DEPARTMENT_OTHER)
Admission: EM | Admit: 2013-10-26 | Discharge: 2013-10-26 | Disposition: A | Discharge status: Home / Self Care | Payer: Managed Care, Other (non HMO) | Attending: Emergency Medicine | Admitting: Emergency Medicine

## 2013-10-26 ENCOUNTER — Encounter (HOSPITAL_BASED_OUTPATIENT_CLINIC_OR_DEPARTMENT_OTHER): Payer: Self-pay | Admitting: Emergency Medicine

## 2013-10-26 ENCOUNTER — Emergency Department (HOSPITAL_BASED_OUTPATIENT_CLINIC_OR_DEPARTMENT_OTHER): Payer: Managed Care, Other (non HMO)

## 2013-10-26 DIAGNOSIS — F17200 Nicotine dependence, unspecified, uncomplicated: Secondary | ICD-10-CM | POA: Insufficient documentation

## 2013-10-26 DIAGNOSIS — Z85528 Personal history of other malignant neoplasm of kidney: Secondary | ICD-10-CM | POA: Insufficient documentation

## 2013-10-26 DIAGNOSIS — R109 Unspecified abdominal pain: Secondary | ICD-10-CM

## 2013-10-26 LAB — COMPREHENSIVE METABOLIC PANEL
ALT: 11 U/L (ref 0–53)
AST: 17 U/L (ref 0–37)
Albumin: 4.1 g/dL (ref 3.5–5.2)
Alkaline Phosphatase: 104 U/L (ref 39–117)
BUN: 18 mg/dL (ref 6–23)
CO2: 24 mEq/L (ref 19–32)
Calcium: 9.5 mg/dL (ref 8.4–10.5)
Chloride: 104 mEq/L (ref 96–112)
Creatinine, Ser: 1.4 mg/dL — ABNORMAL HIGH (ref 0.50–1.35)
GFR calc Af Amer: 65 mL/min — ABNORMAL LOW (ref >90)
GFR calc non Af Amer: 56 mL/min — ABNORMAL LOW (ref >90)
Glucose, Bld: 95 mg/dL (ref 70–99)
Potassium: 4 mEq/L (ref 3.7–5.3)
Sodium: 142 mEq/L (ref 137–147)
Total Bilirubin: 0.4 mg/dL (ref 0.3–1.2)
Total Protein: 7.4 g/dL (ref 6.0–8.3)

## 2013-10-26 LAB — CBC WITH DIFFERENTIAL/PLATELET
Basophils Absolute: 0 10*3/uL (ref 0.0–0.1)
Basophils Relative: 0 % (ref 0–1)
Eosinophils Absolute: 0.1 10*3/uL (ref 0.0–0.7)
Eosinophils Relative: 1 % (ref 0–5)
HCT: 43.7 % (ref 39.0–52.0)
Hemoglobin: 14.7 g/dL (ref 13.0–17.0)
Lymphocytes Relative: 23 % (ref 12–46)
Lymphs Abs: 2.3 10*3/uL (ref 0.7–4.0)
MCH: 29.8 pg (ref 26.0–34.0)
MCHC: 33.6 g/dL (ref 30.0–36.0)
MCV: 88.5 fL (ref 78.0–100.0)
Monocytes Absolute: 0.6 10*3/uL (ref 0.1–1.0)
Monocytes Relative: 6 % (ref 3–12)
Neutro Abs: 7 10*3/uL (ref 1.7–7.7)
Neutrophils Relative %: 70 % (ref 43–77)
Platelets: 235 10*3/uL (ref 150–400)
RBC: 4.94 MIL/uL (ref 4.22–5.81)
RDW: 12.7 % (ref 11.5–15.5)
WBC: 10 10*3/uL (ref 4.0–10.5)

## 2013-10-26 LAB — URINALYSIS, ROUTINE W REFLEX MICROSCOPIC
Bilirubin Urine: NEGATIVE
Glucose, UA: NEGATIVE mg/dL
Hgb urine dipstick: NEGATIVE
Ketones, ur: NEGATIVE mg/dL
Leukocytes, UA: NEGATIVE
Nitrite: NEGATIVE
Protein, ur: NEGATIVE mg/dL
Specific Gravity, Urine: 1.026 (ref 1.005–1.030)
Urobilinogen, UA: 0.2 mg/dL (ref 0.0–1.0)
pH: 5.5 (ref 5.0–8.0)

## 2013-10-26 MED ORDER — ONDANSETRON HCL 4 MG/2ML IJ SOLN
4.0000 mg | Freq: Once | INTRAMUSCULAR | Status: AC
  Administered 2013-10-26: 4 mg via INTRAVENOUS
  Filled 2013-10-26: qty 2

## 2013-10-26 MED ORDER — CYCLOBENZAPRINE HCL 5 MG PO TABS: 5.0000 mg | ORAL_TABLET | Freq: Three times a day (TID) | ORAL | Status: DC | PRN

## 2013-10-26 MED ORDER — HYDROMORPHONE HCL PF 1 MG/ML IJ SOLN
1.0000 mg | Freq: Once | INTRAMUSCULAR | Status: AC
Start: 2013-10-26 — End: 2013-10-26
  Administered 2013-10-26: 1 mg via INTRAVENOUS
  Filled 2013-10-26: qty 1

## 2013-10-26 MED ORDER — OXYCODONE-ACETAMINOPHEN 5-325 MG PO TABS: 1.0000 | ORAL_TABLET | Freq: Four times a day (QID) | ORAL | Status: DC | PRN

## 2013-10-26 NOTE — ED Notes (Signed)
Patient here with right flank pain x 1 day. 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 Reports that the pain is worse with movement, reports some urinary hesitancy

## 2013-10-26 NOTE — ED Provider Notes (Signed)
CSN: [id number]     Arrival date & time 10/26/13  1128 History   First MD Initiated Contact with Patient 10/26/13 1356     Chief Complaint  Patient presents with  . Flank Pain     (Consider location/radiation/quality/duration/timing/severity/associated sxs/prior Treatment) HPI Pt presents with right flank pain and right sided mid abdominal pain.  Pt states he feels similar pain as to when he had right kidney removed due to renal carcinoma.  He states pain comes on and is very intense and lasts 1-2 hours then remits.  Pain is worse with bending forward.  No fever, no vomiting or nausea.  Pain is not associated with eating.  He has had some urinary hesitancy.  No hematuria.  There are no other associated systemic symptoms, there are no other alleviating or modifying factors. No weakness in lower extremities.  No loss of control of bowel or bladder.   Past Medical History  Diagnosis Date  . Kidney carcinoma    Past Surgical History  Procedure Laterality Date  . Nephrectomy      right  . Hernia repair    . Hand surgery     No family history on file. History  Substance Use Topics  . Smoking status: Current Some Day Smoker -- 0.50 packs/day  . Smokeless tobacco: Never Used  . Alcohol Use: No    Review of Systems ROS reviewed and all otherwise negative except for mentioned in HPI    Allergies  Review of patient's allergies indicates no known allergies.  Home Medications   Current Outpatient Rx  Name  Route  Sig  Dispense  Refill  . acetaminophen (TYLENOL) 500 MG tablet   Oral   Take 500 mg by mouth every 6 (six) hours as needed for pain.         . cyclobenzaprine (FLEXERIL) 5 MG tablet   Oral   Take 1 tablet (5 mg total) by mouth 3 (three) times daily as needed for muscle spasms.   20 tablet   0   . ibuprofen (ADVIL,MOTRIN) 200 MG tablet   Oral   Take 200 mg by mouth every 6 (six) hours as needed for pain.         Marland Kitchen oxyCODONE-acetaminophen (PERCOCET/ROXICET)  5-325 MG per tablet   Oral   Take 1-2 tablets by mouth every 6 (six) hours as needed for severe pain.   15 tablet   0   . SUMAtriptan (IMITREX) 100 MG tablet   Oral   Take 1 tablet (100 mg total) by mouth every 2 (two) hours as needed for migraine. May repeat in 2 hours if headache persists or recurs.   5 tablet   0    BP 143/84  Pulse 56  Temp(Src) 97.7 F (36.5 C) (Oral)  Resp 18  Ht 5\' 9"  (1.753 m)  Wt 170 lb (77.111 kg)  BMI 25.09 kg/m2  SpO2 100% Vitals reviewed Physical Exam Physical Examination: General appearance - alert, well appearing, and in no distress Mental status - alert, oriented to person, place, and time Eyes - no conjunctival injection, no scleral icterus Mouth - mucous membranes moist, pharynx normal without lesions Chest - clear to auscultation, no wheezes, rales or rhonchi, symmetric air entry Heart - normal rate, regular rhythm, normal S1, S2, no murmurs, rubs, clicks or gallops Abdomen - soft, nontender, nondistended, no masses or organomegaly Back exam - full range of motion, pain with forward flexion, mild ttp in right paraspinous region in lumbar Neurological -  alert, oriented, normal speech, strength 5/5 in extremities x 4, sensation intact Extremities - peripheral pulses normal, no pedal edema, no clubbing or cyanosis Skin - normal coloration and turgor, no rashes  ED Course  Procedures (including critical care time) Labs Review Labs Reviewed  COMPREHENSIVE METABOLIC PANEL - Abnormal; Notable for the following:    Creatinine, Ser 1.40 (*)    GFR calc non Af Amer 56 (*)    GFR calc Af Amer 65 (*)    All other components within normal limits  URINALYSIS, ROUTINE W REFLEX MICROSCOPIC  CBC WITH DIFFERENTIAL   Imaging Review Ct Abdomen Pelvis Wo Contrast  10/26/2013   CLINICAL DATA:  Right-sided flank pain for 1 day, urinary has a to the, history of right-sided nephrectomy for renal cell carcinoma  EXAM: CT ABDOMEN AND PELVIS WITHOUT CONTRAST   TECHNIQUE: Multidetector CT imaging of the abdomen and pelvis was performed following the standard protocol without intravenous contrast.  COMPARISON:  CT ABD/PELV WO CM dated 03/09/2013; CT ABD/PELVIS W CM dated 09/30/2012; CT ABD/PELVIS W CM dated 09/16/2011  FINDINGS: The lack of intravenous contrast limits the ability to evaluate solid abdominal organs.  Post right-sided nephrectomy. There is no definitive local recurrence within the right nephrectomy bed on this noncontrast examination Normal noncontrast appearance of the left kidney No definite renal stones. No stones are seen along the expected course of the left ureter or within the urinary bladder. Normal noncontrast appearance of the urinary bladder given degree of distention. No urinary destruction or perinephric stranding.  Normal hepatic contour. The gallbladder is under distended but otherwise normal. No radiopaque gallstones. No ascites.  Normal noncontrast appearance of the bilateral adrenal glands, pancreas and spleen.  The bowel is normal in course and caliber without wall thickening or evidence of obstruction. Normal noncontrast appearance of the appendix. No pneumoperitoneum, pneumatosis or portal venous gas.  There is a minimal amount of atherosclerotic plaque within the distal aspect of the abdominal aorta. The abdominal aorta is of normal caliber. Incidental note is made of a circumaortic left-sided renal vein. No definitive bulky, retroperitoneal, pelvic or inguinal lymphadenopathy on this noncontrast examination.  Limited visualization of lower thorax is negative for focal airspace opacity or pleural effusion. 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 No definite pneumothorax.  Normal heart size.  No pericardial effusion.  No acute or aggressive osseous abnormalities. Mild DDD throughout the lumbar spine. Mild  degenerative change of the bilateral hips with a prominent subchondral cyst noted within the anterior aspect of the right femoral head. Regional soft tissues appear normal.  IMPRESSION: 1. No explanation for patient's right-sided flank pain. Specifically, no evidence of enteric obstruction or urinary obstruction within in the solitary remaining left-sided kidney. No evidence of left-sided nephrolithiasis. 2. Post right-sided nephrectomy without definite evidence of local or metastatic disease recurrence to the abdomen or pelvis on this noncontrast examination.   Electronically Signed   By: Sandi Mariscal M.D.   On: 10/26/2013 15:20    EKG Interpretation   None       MDM   Final diagnoses:  Flank pain    Pt presenting with c/o right sided low back pain, labs reassuring, abdominal CT scan reassuring.  Pt feels improved after  Pain meds.  [facility]  I have encouraged him to keep this appointment.  Discharged with  strict return precautions.  Pt agreeable with plan.    Threasa Beards, MD 10/27/13 [phone]

## 2013-10-26 NOTE — Discharge Instructions (Signed)
Return to the ED with any concerns including fever/chills, vomiting, weakness of legs, not able to urinate, loss of control of bowel or bladder, decreased level of alertness/lethargy, or any other alarming symptoms

## 2013-11-14 ENCOUNTER — Encounter (HOSPITAL_COMMUNITY): Payer: Self-pay | Admitting: Emergency Medicine

## 2013-11-14 ENCOUNTER — Emergency Department (HOSPITAL_COMMUNITY)
Admission: EM | Admit: 2013-11-14 | Discharge: 2013-11-14 | Disposition: A | Discharge status: Home / Self Care | Payer: Managed Care, Other (non HMO) | Attending: Emergency Medicine | Admitting: Emergency Medicine

## 2013-11-14 ENCOUNTER — Emergency Department (HOSPITAL_COMMUNITY): Payer: Managed Care, Other (non HMO)

## 2013-11-14 DIAGNOSIS — K5900 Constipation, unspecified: Secondary | ICD-10-CM | POA: Insufficient documentation

## 2013-11-14 DIAGNOSIS — F17200 Nicotine dependence, unspecified, uncomplicated: Secondary | ICD-10-CM | POA: Insufficient documentation

## 2013-11-14 DIAGNOSIS — M7918 Myalgia, other site: Secondary | ICD-10-CM

## 2013-11-14 DIAGNOSIS — M129 Arthropathy, unspecified: Secondary | ICD-10-CM | POA: Insufficient documentation

## 2013-11-14 DIAGNOSIS — Z85528 Personal history of other malignant neoplasm of kidney: Secondary | ICD-10-CM | POA: Insufficient documentation

## 2013-11-14 DIAGNOSIS — Z905 Acquired absence of kidney: Secondary | ICD-10-CM | POA: Insufficient documentation

## 2013-11-14 DIAGNOSIS — M47818 Spondylosis without myelopathy or radiculopathy, sacral and sacrococcygeal region: Secondary | ICD-10-CM

## 2013-11-14 LAB — COMPREHENSIVE METABOLIC PANEL
ALT: 23 U/L (ref 0–53)
AST: 26 U/L (ref 0–37)
Albumin: 4.5 g/dL (ref 3.5–5.2)
Alkaline Phosphatase: 108 U/L (ref 39–117)
BUN: 17 mg/dL (ref 6–23)
CO2: 21 mEq/L (ref 19–32)
Calcium: 10 mg/dL (ref 8.4–10.5)
Chloride: 99 mEq/L (ref 96–112)
Creatinine, Ser: 1.11 mg/dL (ref 0.50–1.35)
GFR calc Af Amer: 86 mL/min — ABNORMAL LOW (ref >90)
GFR calc non Af Amer: 74 mL/min — ABNORMAL LOW (ref >90)
Glucose, Bld: 95 mg/dL (ref 70–99)
Potassium: 4.3 mEq/L (ref 3.7–5.3)
Sodium: 136 mEq/L — ABNORMAL LOW (ref 137–147)
Total Bilirubin: 0.6 mg/dL (ref 0.3–1.2)
Total Protein: 7.9 g/dL (ref 6.0–8.3)

## 2013-11-14 LAB — CBC WITH DIFFERENTIAL/PLATELET
Basophils Absolute: 0 10*3/uL (ref 0.0–0.1)
Basophils Relative: 0 % (ref 0–1)
Eosinophils Absolute: 0 10*3/uL (ref 0.0–0.7)
Eosinophils Relative: 0 % (ref 0–5)
HCT: 45.5 % (ref 39.0–52.0)
Hemoglobin: 15.5 g/dL (ref 13.0–17.0)
Lymphocytes Relative: 18 % (ref 12–46)
Lymphs Abs: 1.6 10*3/uL (ref 0.7–4.0)
MCH: 30.2 pg (ref 26.0–34.0)
MCHC: 34.1 g/dL (ref 30.0–36.0)
MCV: 88.5 fL (ref 78.0–100.0)
Monocytes Absolute: 0.6 10*3/uL (ref 0.1–1.0)
Monocytes Relative: 7 % (ref 3–12)
Neutro Abs: 6.4 10*3/uL (ref 1.7–7.7)
Neutrophils Relative %: 74 % (ref 43–77)
Platelets: 234 10*3/uL (ref 150–400)
RBC: 5.14 MIL/uL (ref 4.22–5.81)
RDW: 12.7 % (ref 11.5–15.5)
WBC: 8.6 10*3/uL (ref 4.0–10.5)

## 2013-11-14 LAB — URINALYSIS, ROUTINE W REFLEX MICROSCOPIC
Bilirubin Urine: NEGATIVE
Glucose, UA: NEGATIVE mg/dL
Hgb urine dipstick: NEGATIVE
Ketones, ur: NEGATIVE mg/dL
Leukocytes, UA: NEGATIVE
Nitrite: NEGATIVE
Protein, ur: NEGATIVE mg/dL
Specific Gravity, Urine: 1.005 (ref 1.005–1.030)
Urobilinogen, UA: 0.2 mg/dL (ref 0.0–1.0)
pH: 6 (ref 5.0–8.0)

## 2013-11-14 MED ORDER — OXYCODONE-ACETAMINOPHEN 5-325 MG PO TABS: 1.0000 | ORAL_TABLET | Freq: Four times a day (QID) | ORAL | Status: DC | PRN

## 2013-11-14 MED ORDER — METHYLPREDNISOLONE 4 MG PO KIT: PACK | ORAL | Status: DC

## 2013-11-14 MED ORDER — OXYCODONE-ACETAMINOPHEN 5-325 MG PO TABS
1.0000 | ORAL_TABLET | Freq: Once | ORAL | Status: AC
  Administered 2013-11-14: 1 via ORAL
  Filled 2013-11-14: qty 1

## 2013-11-14 NOTE — ED Notes (Addendum)
Pt c/o R side flank pain x 1 week and increased urinary frequency x "a while."  Pt was seen at [facility] on 2/15 for same complaint, but Pt did not follow up w/ referral.  Pain score 10/10.  Pt reports that he had this pain previously and was told that it was a muscle spasm.  Sts he has been taking the prescribed medication w/o relief.  Hx of kidney cancer w/ R kidney removal.

## 2013-11-14 NOTE — Discharge Instructions (Signed)
Arthritis, Nonspecific °Arthritis is inflammation of a joint. This usually means pain, redness, warmth or swelling are present. One or more joints may be involved. There are a number of types of arthritis. Your caregiver may not be able to tell what type of arthritis you have right away. °CAUSES  °The most common cause of arthritis is the wear and tear on the joint (osteoarthritis). This causes damage to the cartilage, which can break down over time. The knees, hips, back and neck are most often affected by this type of arthritis. °Other types of arthritis and common causes of joint pain include: °· Sprains and other injuries near the joint. Sometimes minor sprains and injuries cause pain and swelling that develop hours later. °· Rheumatoid arthritis. This affects hands, feet and knees. It usually affects both sides of your body at the same time. It is often associated with chronic ailments, fever, weight loss and general weakness. °· Crystal arthritis. Gout and pseudo gout can cause occasional acute severe pain, redness and swelling in the foot, ankle, or knee. °· Infectious arthritis. Bacteria can get into a joint through a break in overlying skin. This can cause infection of the joint. Bacteria and viruses can also spread through the blood and affect your joints. °· Drug, infectious and allergy reactions. Sometimes joints can become mildly painful and slightly swollen with these types of illnesses. °SYMPTOMS  °· Pain is the main symptom. °· Your joint or joints can also be red, swollen and warm or hot to the touch. °· You may have a fever with certain types of arthritis, or even feel overall ill. °· The joint with arthritis will hurt with movement. Stiffness is present with some types of arthritis. °DIAGNOSIS  °Your caregiver will suspect arthritis based on your description of your symptoms and on your exam. Testing may be needed to find the type of arthritis: °· Blood and sometimes urine tests. °· X-ray tests  and sometimes CT or MRI scans. °· Removal of fluid from the joint (arthrocentesis) is done to check for bacteria, crystals or other causes. Your caregiver (or a specialist) will numb the area over the joint with a local anesthetic, and use a needle to remove joint fluid for examination. This procedure is only minimally uncomfortable. °· Even with these tests, your caregiver may not be able to tell what kind of arthritis you have. Consultation with a specialist (rheumatologist) may be helpful. °TREATMENT  °Your caregiver will discuss with you treatment specific to your type of arthritis. If the specific type cannot be determined, then the following general recommendations may apply. °Treatment of severe joint pain includes: °· Rest. °· Elevation. °· Anti-inflammatory medication (for example, ibuprofen) may be prescribed. Avoiding activities that cause increased pain. °· Only take over-the-counter or prescription medicines for pain and discomfort as recommended by your caregiver. °· Cold packs over an inflamed joint may be used for 10 to 15 minutes every hour. Hot packs sometimes feel better, but do not use overnight. Do not use hot packs if you are diabetic without your caregiver's permission. °· A cortisone shot into arthritic joints may help reduce pain and swelling. °· Any acute arthritis that gets worse over the next 1 to 2 days needs to be looked at to be sure there is no joint infection. °Long-term arthritis treatment involves modifying activities and lifestyle to reduce joint stress jarring. This can include weight loss. Also, exercise is needed to nourish the joint cartilage and remove waste. This helps keep the muscles   around the joint strong. HOME CARE INSTRUCTIONS   Do not take aspirin to relieve pain if gout is suspected. This elevates uric acid levels.  Only take over-the-counter or prescription medicines for pain, discomfort or fever as directed by your caregiver.  Rest the joint as much as  possible.  If your joint is swollen, keep it elevated.  Use crutches if the painful joint is in your leg.  Drinking plenty of fluids may help for certain types of arthritis.  Follow your caregiver's dietary instructions.  Try low-impact exercise such as:  Swimming.  Water aerobics.  Biking.  Walking.  Morning stiffness is often relieved by a warm shower.  Put your joints through regular range-of-motion. SEEK MEDICAL CARE IF:   You do not feel better in 24 hours or are getting worse.  You have side effects to medications, or are not getting better with treatment. SEEK IMMEDIATE MEDICAL CARE IF:   You have a fever.  You develop severe joint pain, swelling or redness.  Many joints are involved and become painful and swollen.  There is severe back pain and/or leg weakness.  You have loss of bowel or bladder control. Document Released: 10/05/2004 Document Revised: 11/20/2011 Document Reviewed: 10/21/2008 [facility] Patient Information 2014 [place]. Back Pain, Adult Low back pain is very common. About 1 in 5 people have back pain.The cause of low back pain is rarely dangerous. The pain often gets better over time.About half of people with a sudden onset of back pain feel better in just 2 weeks. About 8 in 10 people feel better by 6 weeks.  CAUSES Some common causes of back pain include:  Strain of the muscles or ligaments supporting the spine.  Wear and tear (degeneration) of the spinal discs.  Arthritis.  Direct injury to the back. DIAGNOSIS Most of the time, the direct cause of low back pain is not known.However, back pain can be treated effectively even when the exact cause of the pain is unknown.Answering your caregiver's questions about your overall health and symptoms is one of the most accurate ways to make sure the cause of your pain is not dangerous. If your caregiver needs more information, he or she may order lab work or imaging tests (X-rays or  MRIs).However, even if imaging tests show changes in your back, this usually does not require surgery. HOME CARE INSTRUCTIONS For many people, back pain returns.Since low back pain is rarely dangerous, it is often a condition that people can learn to [facility] their own.   Remain active. It is stressful on the back to sit or stand in one place. Do not sit, drive, or stand in one place for more than 30 minutes at a time. Take short walks on level surfaces as soon as pain allows.Try to increase the length of time you walk each day.  Do not stay in bed.Resting more than 1 or 2 days can delay your recovery.  Do not avoid exercise or work.Your body is made to move.It is not dangerous to be active, even though your back may hurt.Your back will likely heal faster if you return to being active before your pain is gone.  Pay attention to your body when you bend and lift. Many people have less discomfortwhen lifting if they bend their knees, keep the load close to their bodies,and avoid twisting. Often, the most comfortable positions are those that put less stress on your recovering back.  Find a comfortable position to sleep. Use a firm mattress and  lie on your side with your knees slightly bent. If you lie on your back, put a pillow under your knees.  Only take over-the-counter or prescription medicines as directed by your caregiver. Over-the-counter medicines to reduce pain and inflammation are often the most helpful.Your caregiver may prescribe muscle relaxant drugs.These medicines help dull your pain so you can more quickly return to your normal activities and healthy exercise.  Put ice on the injured area.  Put ice in a plastic bag.  Place a towel between your skin and the bag.  Leave the ice on for 15-20 minutes, 03-04 times a day for the first 2 to 3 days. After that, ice and heat may be alternated to reduce pain and spasms.  Ask your caregiver about trying back exercises and gentle  massage. This may be of some benefit.  Avoid feeling anxious or stressed.Stress increases muscle tension and can worsen back pain.It is important to recognize when you are anxious or stressed and learn ways to manage it.Exercise is a great option. SEEK MEDICAL CARE IF:  You have pain that is not relieved with rest or medicine.  You have pain that does not improve in 1 week.  You have new symptoms.  You are generally not feeling well. SEEK IMMEDIATE MEDICAL CARE IF:   You have pain that radiates from your back into your legs.  You develop new bowel or bladder control problems.  You have unusual weakness or numbness in your arms or legs.  You develop nausea or vomiting.  You develop abdominal pain.  You feel faint. Document Released: 08/28/2005 Document Revised: 02/27/2012 Document Reviewed: 01/16/2011 [facility] Patient Information 2014 [place], [place].

## 2013-11-14 NOTE — ED Provider Notes (Signed)
CSN: [id number]     Arrival date & time 11/14/13  0909 History   First MD Initiated Contact with Patient 11/14/13 [phone]     Chief Complaint  Patient presents with  . Flank Pain     (Consider location/radiation/quality/duration/timing/severity/associated sxs/prior Treatment) HPI  This is a 53 year old male with history of right-sided renal carcinoma who presents with right flank pain. Patient reports approximately one month history of waxing and waning right-sided flank pain. He reports that his burning and radiates into his right lower quadrant. He states it is worse with movement and he can't get comfortable. He denies any difficulty urinating, hesitancy, or frequency. He denies any injury but does state that he does heavy lifting at work and wears a back brace. He is to Flexeril, hydrocodone, and ibuprofen which is not helping.  Patient denies any radiation of this pain down his legs. Currently his pain is 10 out of 10.  Past Medical History  Diagnosis Date  . Kidney carcinoma    Past Surgical History  Procedure Laterality Date  . Nephrectomy      right  . Hernia repair    . Hand surgery     History reviewed. No pertinent family history. History  Substance Use Topics  . Smoking status: Current Some Day Smoker -- 0.50 packs/day  . Smokeless tobacco: Never Used  . Alcohol Use: No    Review of Systems  Constitutional: Negative.  Negative for fever.  Respiratory: Negative.  Negative for chest tightness and shortness of breath.   Cardiovascular: Negative.  Negative for chest pain.  Gastrointestinal: Positive for constipation. Negative for nausea, vomiting and abdominal pain.  Genitourinary: Positive for flank pain. Negative for dysuria, urgency, frequency, hematuria and decreased urine volume.  Musculoskeletal: Negative for back pain.  Skin: Negative for rash and wound.  Neurological: Negative for headaches.  All other systems reviewed and are negative.      Allergies   Review of patient's allergies indicates no known allergies.  Home Medications   Current Outpatient Rx  Name  Route  Sig  Dispense  Refill  . acetaminophen (TYLENOL) 500 MG tablet   Oral   Take 1,000 mg by mouth every 6 (six) hours as needed for mild pain.          . Aspirin-Acetaminophen-Caffeine (GOODY HEADACHE PO)   Oral   Take 1 packet by mouth as needed.         Marland Kitchen ibuprofen (ADVIL,MOTRIN) 200 MG tablet   Oral   Take 800 mg by mouth every 6 (six) hours as needed for pain.          . Menthol, Topical Analgesic, (ICY HOT BACK) 5 % PADS   Apply externally   Apply 1 patch topically as needed.         . Multiple Vitamin (MULTIVITAMIN WITH MINERALS) TABS tablet   Oral   Take 1 tablet by mouth daily.         . methylPREDNISolone (MEDROL DOSEPAK) 4 MG tablet      follow package directions   21 tablet   0   . oxyCODONE-acetaminophen (PERCOCET/ROXICET) 5-325 MG per tablet   Oral   Take 1 tablet by mouth every 6 (six) hours as needed for severe pain.   15 tablet   0    BP 121/79  Pulse 74  Temp(Src) 98.5 F (36.9 C) (Oral)  Resp 18  SpO2 100% Physical Exam  Nursing note and vitals reviewed. Constitutional: He is oriented to person,  place, and time. He appears well-developed and well-nourished. No distress.  HENT:  Head: Normocephalic and atraumatic.  Eyes: Pupils are equal, round, and reactive to light.  Neck: Neck supple.  Cardiovascular: Normal rate, regular rhythm and normal heart sounds.   No murmur heard. Pulmonary/Chest: Effort normal and breath sounds normal. No respiratory distress. He has no wheezes.  Abdominal: Soft. Bowel sounds are normal. He exhibits no distension. There is no tenderness. There is no rebound and no guarding.  Musculoskeletal: He exhibits no edema.  Tenderness palpation over the right SI joint, no spasm noted  Lymphadenopathy:    He has no cervical adenopathy.  Neurological: He is alert and oriented to person, place, and  time.  5 out of 5 strength in all 4 extremities  Skin: Skin is warm and dry.  Psychiatric: He has a normal mood and affect.    ED Course  Procedures (including critical care time) Labs Review Labs Reviewed  COMPREHENSIVE METABOLIC PANEL - Abnormal; Notable for the following:    Sodium 136 (*)    GFR calc non Af Amer 74 (*)    GFR calc Af Amer 86 (*)    All other components within normal limits  CBC WITH DIFFERENTIAL  URINALYSIS, ROUTINE W REFLEX MICROSCOPIC   Imaging Review Dg Lumbar Spine Complete  11/14/2013   CLINICAL DATA:  Pain  EXAM: LUMBAR SPINE - COMPLETE 4+ VIEW  COMPARISON:  None.  FINDINGS: Frontal, lateral, spot lumbosacral lateral, and bilateral oblique views were obtained. The there are 5 non-rib-bearing lumbar type vertebral bodies. There is lumbar dextroscoliosis. There is no fracture or spondylolisthesis. There is moderate disc space narrowing at L4-5 and L5-S1. There is facet osteoarthritic change at L4-5 and L5-S1 bilaterally.  IMPRESSION: Scoliosis. 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 No fracture or spondylolisthesis.   Electronically Signed   By: Lowella Grip M.D.   On: 11/14/2013 10:12     EKG Interpretation None      MDM   Final diagnoses:  SI joint arthritis  Musculoskeletal pain   Patient presents with right flank and back pain. He is nontoxic on exam. He does have tenderness to palpation of the right SI joint and I suspect musculoskeletal etiology given the worsening of pain with movement. He recently had a CT scan that showed no intra-abdominal abnormality. Lab work is reassuring. Lumbar x-rays suggestive of arthritis. No red flags of back pain.  Patient was given pain medications. Creatinine is mildly elevated and he's not candidate for NSAIDs. Will put patient on a Medrol Dosepak. He has requested a primary care physician. He has insurance. He was referred to a [place] primary care. I encouraged patient to make an appointment as he may benefit  from physical therapy.   After history, exam, and medical workup I feel the patient has been appropriately medically screened and is safe for discharge home. Pertinent diagnoses were discussed with the patient. Patient was given return precautions.    Merryl Hacker, MD 11/15/13 [phone]

## 2013-12-23 ENCOUNTER — Ambulatory Visit: Payer: Managed Care, Other (non HMO) | Admitting: Physician Assistant

## 2013-12-30 ENCOUNTER — Ambulatory Visit: Payer: Managed Care, Other (non HMO) | Admitting: Internal Medicine

## 2013-12-30 DIAGNOSIS — Z0289 Encounter for other administrative examinations: Secondary | ICD-10-CM

## 2014-04-16 ENCOUNTER — Encounter (HOSPITAL_BASED_OUTPATIENT_CLINIC_OR_DEPARTMENT_OTHER): Payer: Self-pay | Admitting: Emergency Medicine

## 2014-04-16 ENCOUNTER — Emergency Department (HOSPITAL_BASED_OUTPATIENT_CLINIC_OR_DEPARTMENT_OTHER)
Admission: EM | Admit: 2014-04-16 | Discharge: 2014-04-16 | Disposition: A | Discharge status: Home / Self Care | Payer: Managed Care, Other (non HMO) | Attending: Emergency Medicine | Admitting: Emergency Medicine

## 2014-04-16 DIAGNOSIS — M549 Dorsalgia, unspecified: Secondary | ICD-10-CM | POA: Insufficient documentation

## 2014-04-16 DIAGNOSIS — Z79899 Other long term (current) drug therapy: Secondary | ICD-10-CM | POA: Insufficient documentation

## 2014-04-16 DIAGNOSIS — F17200 Nicotine dependence, unspecified, uncomplicated: Secondary | ICD-10-CM | POA: Insufficient documentation

## 2014-04-16 DIAGNOSIS — M5442 Lumbago with sciatica, left side: Secondary | ICD-10-CM

## 2014-04-16 DIAGNOSIS — M5430 Sciatica, unspecified side: Secondary | ICD-10-CM | POA: Insufficient documentation

## 2014-04-16 DIAGNOSIS — Z85528 Personal history of other malignant neoplasm of kidney: Secondary | ICD-10-CM | POA: Insufficient documentation

## 2014-04-16 MED ORDER — TRAMADOL HCL 50 MG PO TABS: 50.0000 mg | ORAL_TABLET | Freq: Four times a day (QID) | ORAL | Status: DC | PRN

## 2014-04-16 MED ORDER — CYCLOBENZAPRINE HCL 10 MG PO TABS: 10.0000 mg | ORAL_TABLET | Freq: Three times a day (TID) | ORAL | Status: DC | PRN

## 2014-04-16 MED ORDER — METHYLPREDNISOLONE (PAK) 4 MG PO TABS: ORAL_TABLET | ORAL | Status: DC

## 2014-04-16 NOTE — ED Notes (Signed)
C/o low back pain x 1 week. States he had appoint. At [facility] in [place] but missed appoint d/t no gas. 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 Mostly left low mid back pain, and sometimes pain shoots down left leg.

## 2014-04-16 NOTE — ED Provider Notes (Signed)
CSN: [id number]     Arrival date & time 04/16/14  0847 History   First MD Initiated Contact with Patient 04/16/14 0902   History provided by patient.   Chief Complaint  Patient presents with  . Back Pain   HPI  Patient reported that symptoms of left lower back pain. He reported that the current LBP has been gradually worsening and present over past 1-2 months. Denies any accident, trauma, fall, or injury. He describes it has an intermittent dull ache (currently 8/10) with occasional sharp pains that radiating down his Left leg to his knee, but does not extend into lower leg and foot. Denies any numbness, tingling, or weakness. Pain is worse with increased activity, especially while at work with some heavy lifting, pulling, straining (works as Cabin crew), also worse with extension and improved with forward flexion. Tried Tylenol, Advil and previously Percocet and Flexeril, without significant relief. Denies fever/chills, urinary/stool incontinence or urinary retention.  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 Denies hx of PT or surgery. Additional PMH with Right Renal Cell Carcinoma s/p R-nephrectomy, [facility], last CT Abd/Pelvis 10/2013.   Past Medical History  Diagnosis Date  . Kidney carcinoma    Past Surgical History  Procedure Laterality Date  . Nephrectomy      right  . Hernia repair    . Hand surgery     No family history on file. History  Substance Use Topics  . Smoking status: Current Some Day Smoker -- 0.50 packs/day  . Smokeless tobacco: Never Used  . Alcohol Use: No    Review of Systems  See above HPI  Allergies  Review of patient's allergies indicates no known allergies.  Home Medications   Prior to Admission medications    Medication Sig Start Date End Date Taking? Authorizing Provider  acetaminophen (TYLENOL) 500 MG tablet Take 1,000 mg by mouth every 6 (six) hours as needed for mild pain.     Historical Provider, MD  Aspirin-Acetaminophen-Caffeine (GOODY HEADACHE PO) Take 1 packet by mouth as needed.    Historical Provider, MD  cyclobenzaprine (FLEXERIL) 10 MG tablet Take 1 tablet (10 mg total) by mouth 3 (three) times daily as needed for muscle spasms. 04/16/14   Nobie Putnam, DO  ibuprofen (ADVIL,MOTRIN) 200 MG tablet Take 800 mg by mouth every 6 (six) hours as needed for pain.     Historical Provider, MD  Menthol, Topical Analgesic, (ICY HOT BACK) 5 % PADS Apply 1 patch topically as needed.    Historical Provider, MD  methylPREDNIsolone (MEDROL DOSPACK) 4 MG tablet follow package directions 04/16/14   Nobie Putnam, DO  Multiple Vitamin (MULTIVITAMIN WITH MINERALS) TABS tablet Take 1 tablet by mouth daily.    Historical Provider, MD  oxyCODONE-acetaminophen (PERCOCET/ROXICET) 5-325 MG per tablet Take 1 tablet by mouth every 6 (six) hours as needed for severe pain. 11/14/13   Merryl Hacker, MD  traMADol (ULTRAM) 50 MG tablet Take 1 tablet (50 mg total) by mouth every 6 (six) hours as needed. 04/16/14   Nobie Putnam, DO   BP 122/79  Pulse 80  Temp(Src) 98 F (36.7 C) (Oral)  Resp 18  Wt 170 lb (77.111 kg)  SpO2 100% Physical Exam  Gen - well-appearing, sitting on edge of bed forward flexed position, cooperative, uncomfortable  HEENT - MMM Neck - supple, non-tender Heart - RRR, no murmurs heard Lungs - CTAB. Normal work of breathing. Abd - soft, NTND, no masses, +active BS MSK - Lower back - left paraspinal L5 - SI musculature tender to palpation with mild hypertonicity, non-tender over spinous processes, normal active ROM forward flexion L-spine, slight limited back extension, supine SLR bilaterally negative without reproduction of pain or radicular symptoms. Ext - non-tender, no  edema, peripheral pulses intact +2 b/l Skin - warm, dry, no rashes Neuro - awake, alert, oriented, grossly non-focal, intact muscle strength 5/5 b/l, intact distal sensation to light touch, gait normal  ED Course  Procedures (including critical care time) Labs Review Labs Reviewed - No data to display  Imaging Review No results found.   EKG Interpretation None      MDM   Final diagnoses:  Left-sided low back pain with left-sided sciatica   53 yr M with PMH known Lumbar/SI osteoarthritis, s/p Right Renal Cell Carcinoma, nephrectomy, presents with subacute worsening of chronic low back pain, currently with Left low back / SI pain, worse with extension and activity, especially lifting at work. No red flags, afebrile, no urinary sx. With +TTP left L-spine paraspinal muscles, no evidence of radiculopathy but hx of intermittent sciatica. Last L-spine X-ray 11/2013 with OA and without acute findings. Suspected flare of Left Lumbar/SI OA with associated sciatica, and consistent with psoas/piriformis muscle spasm. Caution with hx R-RCC, monitored by [facility] for routine f/u and last CT 10/2013 negative.  Discharged to home with [facility] taper, Flexeril 10mg  TID PRN (inc dose from previously), Tramadol 50mg  PRN, advised close f/u with PCP at [facility], referral information to Karlton Lemon, MD (Sports Med, local), return to work on Tuesday 04/21/14, relative rest and stretching, Tylenol PRN. Return precautions given.    Nobie Putnam, DO 04/16/14 1304

## 2014-04-16 NOTE — Discharge Instructions (Signed)
You were treated for low back pain. We believe that this is a flare of your low back / spine / hip joint arthritis, which can cause some muscle spasm and pain. Prescribed Steroid Dose Pack - follow instructions and complete this medicine to reduce inflammation. Prescribed Flexeril (muscle relaxant) - take 1 tablet up to 3 times a day as needed for pain. This is a higher dose than you were on before, so you may take HALF a tablet if a whole is too strong. Caution this may make you sleepy, do not take if driving or while working with heavy machinery.  If you develop significant worsening symptoms with back pain, fever/chills, difficulty urinating or unable to control urination / bowel movements, worsening numbness, tingling or shooting pain down your legs, please call your doctor and seek immediate medical attention, please return to Emergency Department for further evaluation.  Recommend regular follow-up with your Primary Doctor. We would highly recommend that they arrange a referral to Physical Therapy. Here is the number of a Sports Medicine Doctor Karlton Lemon) located here in the building at [employer] that will see you regularly for your low back pain. Call to schedule at - [phone]

## 2014-04-17 NOTE — ED Provider Notes (Signed)
I saw and evaluated the patient, reviewed the resident's note and I agree with the findings and plan.   EKG Interpretation None      Pt with lumbar back pain, no neuro deficits, no signs of cauda equina, no abd pain.  Likely musculoskeletal  Malvin Johns, MD 04/17/14 [phone]

## 2014-05-08 ENCOUNTER — Encounter (HOSPITAL_COMMUNITY): Payer: Self-pay | Admitting: Emergency Medicine

## 2014-05-08 ENCOUNTER — Emergency Department (HOSPITAL_COMMUNITY)
Admission: EM | Admit: 2014-05-08 | Discharge: 2014-05-08 | Disposition: A | Discharge status: Home / Self Care | Payer: Managed Care, Other (non HMO) | Attending: Emergency Medicine | Admitting: Emergency Medicine

## 2014-05-08 DIAGNOSIS — K5900 Constipation, unspecified: Secondary | ICD-10-CM | POA: Diagnosis not present

## 2014-05-08 DIAGNOSIS — F17200 Nicotine dependence, unspecified, uncomplicated: Secondary | ICD-10-CM | POA: Insufficient documentation

## 2014-05-08 DIAGNOSIS — Z79899 Other long term (current) drug therapy: Secondary | ICD-10-CM | POA: Insufficient documentation

## 2014-05-08 DIAGNOSIS — M549 Dorsalgia, unspecified: Secondary | ICD-10-CM | POA: Insufficient documentation

## 2014-05-08 DIAGNOSIS — Z85528 Personal history of other malignant neoplasm of kidney: Secondary | ICD-10-CM | POA: Insufficient documentation

## 2014-05-08 DIAGNOSIS — IMO0002 Reserved for concepts with insufficient information to code with codable children: Secondary | ICD-10-CM | POA: Insufficient documentation

## 2014-05-08 DIAGNOSIS — M5416 Radiculopathy, lumbar region: Secondary | ICD-10-CM

## 2014-05-08 MED ORDER — PREDNISONE 20 MG PO TABS
60.0000 mg | ORAL_TABLET | Freq: Once | ORAL | Status: AC
  Administered 2014-05-08: 60 mg via ORAL
  Filled 2014-05-08: qty 3

## 2014-05-08 MED ORDER — PREDNISONE 10 MG PO TABS: ORAL_TABLET | ORAL | Status: DC

## 2014-05-08 MED ORDER — HYDROMORPHONE HCL PF 1 MG/ML IJ SOLN
1.0000 mg | Freq: Once | INTRAMUSCULAR | Status: AC
  Administered 2014-05-08: 1 mg via INTRAMUSCULAR
  Filled 2014-05-08: qty 1

## 2014-05-08 MED ORDER — OXYCODONE-ACETAMINOPHEN 5-325 MG PO TABS: 1.0000 | ORAL_TABLET | ORAL | Status: DC | PRN

## 2014-05-08 NOTE — ED Notes (Signed)
Pt sts that he has had back pain and been seen in ED for it. Pt has no new injury and pain is increased. No trouble with urination.

## 2014-05-08 NOTE — ED Notes (Signed)
[facility] explained in detail. Pt to lobby to wait for wife to pick him up.

## 2014-05-08 NOTE — ED Provider Notes (Signed)
CSN: [id number]     Arrival date & time 05/08/14  1050 History   First MD Initiated Contact with Patient 05/08/14 1102     Chief Complaint  Patient presents with  . Back Pain     (Consider location/radiation/quality/duration/timing/severity/associated sxs/prior Treatment) HPI Joshua Lewis is a 53 y.o. male, with history of nephrectomy do to a renal carcinoma, presents emergency department with progressive left side and back pain. Patient states he has had back pain for over a year. He has had multiple visits here for the same. He states he does lift heavy objects at work, and states "I may have overdid it." He denies any definite injuries. States pain is in his lower back, radiates around left hip, into the left lateral thigh. He denies any numbness or weakness in his legs. He's taking tramadol, hydrocodone, ibuprofen 800 mg with no relief. He denies fever. No bladder or bowels problems. He states he is constipated, however denies any fecal incontinence. He states pain is similar to his prior pain. Patient states he's frustrated and needs a referral to a specialist, states he is unable to handle the pain anymore.  Past Medical History  Diagnosis Date  . Kidney carcinoma    Past Surgical History  Procedure Laterality Date  . Nephrectomy      right  . Hernia repair    . Hand surgery     History reviewed. No pertinent family history. History  Substance Use Topics  . Smoking status: Current Some Day Smoker -- 0.50 packs/day  . Smokeless tobacco: Never Used  . Alcohol Use: No    Review of Systems  Constitutional: Negative for fever and chills.  Respiratory: Negative for cough, chest tightness and shortness of breath.   Cardiovascular: Negative for chest pain, palpitations and leg swelling.  Gastrointestinal: Positive for constipation. Negative for nausea, vomiting, abdominal pain, diarrhea and abdominal distention.  Genitourinary: Negative for dysuria, urgency, frequency and hematuria.   Musculoskeletal: Positive for back pain. Negative for arthralgias, neck pain and neck stiffness.  Skin: Negative for rash.  Allergic/Immunologic: Negative for immunocompromised state.  Neurological: Negative for dizziness, weakness, light-headedness, numbness and headaches.      Allergies  Review of patient's allergies indicates no known allergies.  Home Medications   Prior to Admission medications   Medication Sig Start Date End Date Taking? Authorizing Provider  acetaminophen (TYLENOL) 500 MG tablet Take 1,000 mg by mouth every 6 (six) hours as needed for mild pain.    Yes Historical Provider, MD  Multiple Vitamin (MULTIVITAMIN WITH MINERALS) TABS tablet Take 1 tablet by mouth daily.   Yes Historical Provider, MD  traMADol (ULTRAM) 50 MG tablet Take 1 tablet (50 mg total) by mouth every 6 (six) hours as needed. 04/16/14  Yes Alexander Karamalegos, DO   BP 141/89  Pulse 87  Temp(Src) 97.8 F (36.6 C) (Oral)  Resp 22  SpO2 100% Physical Exam  Nursing note and vitals reviewed. Constitutional: He appears well-developed and well-nourished. No distress.  HENT:  Head: Normocephalic and atraumatic.  Eyes: Conjunctivae are normal.  Neck: Neck supple.  Cardiovascular: Normal rate, regular rhythm and normal heart sounds.   Pulmonary/Chest: Effort normal. No respiratory distress. He has no wheezes. He has no rales.  Abdominal: Soft. Bowel sounds are normal. He exhibits no distension. There is no tenderness. There is no rebound.  Musculoskeletal: He exhibits no edema.  Lumbar midline spine tenderness. Tenderness in left SI joint. Pain with left straight leg raise  Neurological: He is alert.  5/5 and equal lower extremity strength. 2+ and equal patellar reflexes bilaterally. Pt able to dorsiflex bilateral toes and feet with good strength against resistance. Equal sensation bilaterally over thighs and lower legs.   Skin: Skin is warm and dry.    ED Course  Procedures (including  critical care time) Labs Review Labs Reviewed - No data to display  Imaging Review No results found.   EKG Interpretation None      MDM   Final diagnoses:  Lumbar radiculopathy    Patient with progressive lower back pain radiating into his left thigh. A red flags to suggest cauda equina at this time. He is ambulatory. He's taking tramadol and Vicodin as well as ibuprofen with no relief at this time. He is afebrile. Patient is a producible with movement of left leg. He is neurovascularly intact. Will place on prednisone taper, Percocet for severe pain, followup with neurosurgery. He had the last imaging of his lumbar spine done at in March of this year, showed degenerative disc disease.  Filed Vitals:   05/08/14 1056  BP: 141/89  Pulse: 87  Temp: 97.8 F (36.6 C)  Resp: 22       Farzad Tibbetts A Dorri Ozturk, PA-C 05/08/14 1718

## 2014-05-08 NOTE — Discharge Instructions (Signed)
Take percocet as prescribed as needed for severe pain. Prednisone taper, next dose tomorrow. Follow up with a specialist or primary care doctor.    Back Exercises Back exercises help treat and prevent back injuries. The goal is to increase your strength in your belly (abdominal) and back muscles. These exercises can also help with flexibility. Start these exercises when told by your doctor. HOME CARE Back exercises include: Pelvic Tilt.  Lie on your back with your knees bent. Tilt your pelvis until the lower part of your back is against the floor. Hold this position 5 to 10 sec. Repeat this exercise 5 to 10 times. Knee to Chest.  Pull 1 knee up against your chest and hold for 20 to 30 seconds. Repeat this with the other knee. This may be done with the other leg straight or bent, whichever feels better. Then, pull both knees up against your chest. Sit-Ups or Curl-Ups.  Bend your knees 90 degrees. Start with tilting your pelvis, and do a partial, slow sit-up. Only lift your upper half 30 to 45 degrees off the floor. Take at least 2 to 3 seonds for each sit-up. Do not do sit-ups with your knees out straight. If partial sit-ups are difficult, simply do the above but with only tightening your belly (abdominal) muscles and holding it as told. Hip-Lift.  Lie on your back with your knees flexed 90 degrees. Push down with your feet and shoulders as you raise your hips 2 inches off the floor. Hold for 10 seconds, repeat 5 to 10 times. Back Arches.  Lie on your stomach. Prop yourself up on bent elbows. Slowly press on your hands, causing an arch in your low back. Repeat 3 to 5 times. Shoulder-Lifts.  Lie face down with arms beside your body. Keep hips and belly pressed to floor as you slowly lift your head and shoulders off the floor. Do not overdo your exercises. Be careful in the beginning. Exercises may cause you some mild back discomfort. If the pain lasts for more than 15 minutes, stop the  exercises until you see your doctor. Improvement with exercise for back problems is slow.  Document Released: 09/30/2010 Document Revised: 11/20/2011 Document Reviewed: 06/29/2011 [facility] Patient Information 2015 [place], [place]. This information is not intended to replace advice given to you by your health care provider. Make sure you discuss any questions you have with your health care provider.

## 2014-05-12 NOTE — ED Provider Notes (Signed)
Medical screening examination/treatment/procedure(s) were performed by non-physician practitioner and as supervising physician I was immediately available for consultation/collaboration.   EKG Interpretation None        Houston Siren III, MD 05/12/14 [phone]

## 2014-06-19 ENCOUNTER — Encounter (HOSPITAL_COMMUNITY): Payer: Self-pay | Admitting: Emergency Medicine

## 2014-06-19 DIAGNOSIS — R079 Chest pain, unspecified: Secondary | ICD-10-CM | POA: Insufficient documentation

## 2014-06-19 DIAGNOSIS — Z720 Tobacco use: Secondary | ICD-10-CM | POA: Diagnosis not present

## 2014-06-19 LAB — CBC
HCT: 39.3 % (ref 39.0–52.0)
Hemoglobin: 13.7 g/dL (ref 13.0–17.0)
MCH: 30.2 pg (ref 26.0–34.0)
MCHC: 34.9 g/dL (ref 30.0–36.0)
MCV: 86.8 fL (ref 78.0–100.0)
Platelets: 254 10*3/uL (ref 150–400)
RBC: 4.53 MIL/uL (ref 4.22–5.81)
RDW: 13.4 % (ref 11.5–15.5)
WBC: 12.3 10*3/uL — ABNORMAL HIGH (ref 4.0–10.5)

## 2014-06-19 LAB — I-STAT TROPONIN, ED: Troponin i, poc: 0 ng/mL (ref 0.00–0.08)

## 2014-06-19 NOTE — ED Notes (Signed)
Pt. 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 Respirations unlabored / no nausea or diaphoresis . Pt. received 4 baby ASA by EMS prior to arrival .

## 2014-06-20 ENCOUNTER — Emergency Department (HOSPITAL_COMMUNITY)
Admission: EM | Admit: 2014-06-20 | Discharge: 2014-06-20 | Discharge status: Against Medical Advice | Payer: Managed Care, Other (non HMO) | Attending: Emergency Medicine | Admitting: Emergency Medicine

## 2014-06-20 ENCOUNTER — Emergency Department (HOSPITAL_COMMUNITY): Payer: Managed Care, Other (non HMO)

## 2014-06-20 LAB — BASIC METABOLIC PANEL
Anion gap: 16 — ABNORMAL HIGH (ref 5–15)
BUN: 15 mg/dL (ref 6–23)
CO2: 21 mEq/L (ref 19–32)
Calcium: 9.1 mg/dL (ref 8.4–10.5)
Chloride: 100 mEq/L (ref 96–112)
Creatinine, Ser: 1.26 mg/dL (ref 0.50–1.35)
GFR calc Af Amer: 74 mL/min — ABNORMAL LOW (ref >90)
GFR calc non Af Amer: 64 mL/min — ABNORMAL LOW (ref >90)
Glucose, Bld: 129 mg/dL — ABNORMAL HIGH (ref 70–99)
Potassium: 4.1 mEq/L (ref 3.7–5.3)
Sodium: 137 mEq/L (ref 137–147)

## 2014-08-23 ENCOUNTER — Emergency Department (HOSPITAL_COMMUNITY)
Admission: EM | Admit: 2014-08-23 | Discharge: 2014-08-23 | Disposition: A | Discharge status: Home / Self Care | Payer: Managed Care, Other (non HMO) | Attending: Emergency Medicine | Admitting: Emergency Medicine

## 2014-08-23 ENCOUNTER — Encounter (HOSPITAL_COMMUNITY): Payer: Self-pay | Admitting: Emergency Medicine

## 2014-08-23 DIAGNOSIS — G8929 Other chronic pain: Secondary | ICD-10-CM | POA: Insufficient documentation

## 2014-08-23 DIAGNOSIS — M519 Unspecified thoracic, thoracolumbar and lumbosacral intervertebral disc disorder: Secondary | ICD-10-CM

## 2014-08-23 DIAGNOSIS — Z720 Tobacco use: Secondary | ICD-10-CM | POA: Insufficient documentation

## 2014-08-23 DIAGNOSIS — Z79899 Other long term (current) drug therapy: Secondary | ICD-10-CM | POA: Insufficient documentation

## 2014-08-23 DIAGNOSIS — Z85528 Personal history of other malignant neoplasm of kidney: Secondary | ICD-10-CM | POA: Insufficient documentation

## 2014-08-23 DIAGNOSIS — M5136 Other intervertebral disc degeneration, lumbar region: Secondary | ICD-10-CM | POA: Insufficient documentation

## 2014-08-23 DIAGNOSIS — M1990 Unspecified osteoarthritis, unspecified site: Secondary | ICD-10-CM | POA: Insufficient documentation

## 2014-08-23 MED ORDER — HYDROCODONE-ACETAMINOPHEN 5-325 MG PO TABS: 1.0000 | ORAL_TABLET | Freq: Four times a day (QID) | ORAL | Status: DC | PRN

## 2014-08-23 MED ORDER — CYCLOBENZAPRINE HCL 5 MG PO TABS: 5.0000 mg | ORAL_TABLET | Freq: Three times a day (TID) | ORAL | Status: DC | PRN

## 2014-08-23 NOTE — Discharge Instructions (Signed)
Arthritis, Nonspecific °Arthritis is inflammation of a joint. This usually means pain, redness, warmth or swelling are present. One or more joints may be involved. There are a number of types of arthritis. Your caregiver may not be able to tell what type of arthritis you have right away. °CAUSES  °The most common cause of arthritis is the wear and tear on the joint (osteoarthritis). This causes damage to the cartilage, which can break down over time. The knees, hips, back and neck are most often affected by this type of arthritis. °Other types of arthritis and common causes of joint pain include: °· Sprains and other injuries near the joint. Sometimes minor sprains and injuries cause pain and swelling that develop hours later. °· Rheumatoid arthritis. This affects hands, feet and knees. It usually affects both sides of your body at the same time. It is often associated with chronic ailments, fever, weight loss and general weakness. °· Crystal arthritis. Gout and pseudo gout can cause occasional acute severe pain, redness and swelling in the foot, ankle, or knee. °· Infectious arthritis. Bacteria can get into a joint through a break in overlying skin. This can cause infection of the joint. Bacteria and viruses can also spread through the blood and affect your joints. °· Drug, infectious and allergy reactions. Sometimes joints can become mildly painful and slightly swollen with these types of illnesses. °SYMPTOMS  °· Pain is the main symptom. °· Your joint or joints can also be red, swollen and warm or hot to the touch. °· You may have a fever with certain types of arthritis, or even feel overall ill. °· The joint with arthritis will hurt with movement. Stiffness is present with some types of arthritis. °DIAGNOSIS  °Your caregiver will suspect arthritis based on your description of your symptoms and on your exam. Testing may be needed to find the type of arthritis: °· Blood and sometimes urine tests. °· X-ray tests  and sometimes CT or MRI scans. °· Removal of fluid from the joint (arthrocentesis) is done to check for bacteria, crystals or other causes. Your caregiver (or a specialist) will numb the area over the joint with a local anesthetic, and use a needle to remove joint fluid for examination. This procedure is only minimally uncomfortable. °· Even with these tests, your caregiver may not be able to tell what kind of arthritis you have. Consultation with a specialist (rheumatologist) may be helpful. °TREATMENT  °Your caregiver will discuss with you treatment specific to your type of arthritis. If the specific type cannot be determined, then the following general recommendations may apply. °Treatment of severe joint pain includes: °· Rest. °· Elevation. °· Anti-inflammatory medication (for example, ibuprofen) may be prescribed. Avoiding activities that cause increased pain. °· Only take over-the-counter or prescription medicines for pain and discomfort as recommended by your caregiver. °· Cold packs over an inflamed joint may be used for 10 to 15 minutes every hour. Hot packs sometimes feel better, but do not use overnight. Do not use hot packs if you are diabetic without your caregiver's permission. °· A cortisone shot into arthritic joints may help reduce pain and swelling. °· Any acute arthritis that gets worse over the next 1 to 2 days needs to be looked at to be sure there is no joint infection. °Long-term arthritis treatment involves modifying activities and lifestyle to reduce joint stress jarring. This can include weight loss. Also, exercise is needed to nourish the joint cartilage and remove waste. This helps keep the muscles   around the joint strong. °HOME CARE INSTRUCTIONS  °· Do not take aspirin to relieve pain if gout is suspected. This elevates uric acid levels. °· Only take over-the-counter or prescription medicines for pain, discomfort or fever as directed by your caregiver. °· Rest the joint as much as  possible. °· If your joint is swollen, keep it elevated. °· Use crutches if the painful joint is in your leg. °· Drinking plenty of fluids may help for certain types of arthritis. °· Follow your caregiver's dietary instructions. °· Try low-impact exercise such as: °¨ Swimming. °¨ Water aerobics. °¨ Biking. °¨ Walking. °· Morning stiffness is often relieved by a warm shower. °· Put your joints through regular range-of-motion. °SEEK MEDICAL CARE IF:  °· You do not feel better in 24 hours or are getting worse. °· You have side effects to medications, or are not getting better with treatment. °SEEK IMMEDIATE MEDICAL CARE IF:  °· You have a fever. °· You develop severe joint pain, swelling or redness. °· Many joints are involved and become painful and swollen. °· There is severe back pain and/or leg weakness. °· You have loss of bowel or bladder control. °Document Released: 10/05/2004 Document Revised: 11/20/2011 Document Reviewed: 10/21/2008 °[employer]® Patient Information ©2015 [employer]. This information is not intended to replace advice given to you by your health care provider. Make sure you discuss any questions you have with your health care provider. ° °

## 2014-08-23 NOTE — ED Provider Notes (Signed)
CSN: [id number]     Arrival date & time 08/23/14  1318 History  This chart was scribed for a non-physician practitioner, Glendell Docker, NP working with Jasper Riling. Alvino Chapel, MD by Martinique Peace, ED Scribe. The patient was seen in [facility]. The patient's care was started at 1:53 PM.    Chief Complaint  Patient presents with  . Hip Pain    l/hip pain increased over last few weeks      Patient is a 53 y.o. male presenting with hip pain. The history is provided by the patient. No language interpreter was used.  Hip Pain This is a chronic problem. The current episode started more than 1 week ago. The problem occurs constantly. The problem has been gradually worsening. Nothing relieves the symptoms. He has tried a warm compress and acetaminophen for the symptoms.  HPI Comments: Joshua Lewis is a 53 y.o. male who presents to the Emergency Department complaining of non-radiating chronic lower back and left hip pain that was been gradually worsening over the past few weeks. He describes pain as constant and rates pain as 10/10. He explains that he has been taking pain medication, trying heat compresses, and doing physical therapy without any improvment. Pt states that he stopped going to physical therapy 2 weeks ago because "it was not helping and he wanted to get a second opinion from someone else". He adds that he was told he may need surgery to fix problem. Pt has MRI with him of hip and lower back that was order by his PCP. Denies numbness weakness and incontinence.His PCP is Administrator, [employer]. Pt is current smoker.    Past Medical History  Diagnosis Date  . Kidney carcinoma    Past Surgical History  Procedure Laterality Date  . Nephrectomy      right  . Hernia repair    . Hand surgery     Family History  Problem Relation Age of Onset  . Cancer Mother   . Diabetes Mother   . Hypertension Mother    History  Substance Use Topics  . Smoking status: Current Some Day Smoker -- 0.50  packs/day  . Smokeless tobacco: Never Used  . Alcohol Use: No    Review of Systems  Musculoskeletal: Positive for back pain (lower).       Left hip pain.   All other systems reviewed and are negative.     Allergies  Review of patient's allergies indicates no known allergies.  Home Medications   Prior to Admission medications   Medication Sig Start Date End Date Taking? Authorizing Provider  acetaminophen (TYLENOL) 500 MG tablet Take 1,000 mg by mouth every 6 (six) hours as needed for mild pain.    Yes Historical Provider, MD  Multiple Vitamin (MULTIVITAMIN WITH MINERALS) TABS tablet Take 1 tablet by mouth daily.   Yes Historical Provider, MD  oxyCODONE-acetaminophen (PERCOCET) 5-325 MG per tablet Take 1 tablet by mouth every 4 (four) hours as needed for severe pain. Patient not taking: Reported on 08/23/2014 05/08/14   Tatyana A Kirichenko, PA-C  predniSONE (DELTASONE) 10 MG tablet Take 5 tab day 1, take 4 tab day 2, take 3 tab day 3, take 2 tab day 4, and take 1 tab day 5 Patient not taking: Reported on 08/23/2014 05/08/14   Tatyana A Kirichenko, PA-C  traMADol (ULTRAM) 50 MG tablet Take 1 tablet (50 mg total) by mouth every 6 (six) hours as needed. Patient not taking: Reported on 08/23/2014 04/16/14   Nobie Putnam,  DO   BP 120/86 mmHg  Pulse 72  Temp(Src) 97.9 F (36.6 C) (Oral)  Resp 20  Wt 174 lb (78.926 kg)  SpO2 97% Physical Exam  Constitutional: He is oriented to person, place, and time. He appears well-developed and well-nourished. No distress.  HENT:  Head: Normocephalic and atraumatic.  Eyes: Conjunctivae and EOM are normal.  Neck: Neck supple. No tracheal deviation present.  Cardiovascular: Normal rate.   Pulmonary/Chest: Effort normal. No respiratory distress.  Musculoskeletal: Normal range of motion.  Neurological: He is alert and oriented to person, place, and time. He exhibits normal muscle tone. Coordination normal.  Skin: Skin is warm and dry.   Psychiatric: He has a normal mood and affect. His behavior is normal.  Nursing note and vitals reviewed.   ED Course  Procedures (including critical care time) Labs Review Labs Reviewed - No data to display  Imaging Review No results found.   EKG Interpretation None     Medications - No data to display  1:57 PM- Treatment plan was discussed with patient who verbalizes understanding and agrees.   MDM   Final diagnoses:  Arthritis  Disc disorder    Mri results evaluated.and arthritis and disc protrusion noted. Pt is neurologically intact. Will treat with hydrocodone and flexeril. Pt given ortho referral  I personally performed the services described in this documentation, which was scribed in my presence. The recorded information has been reviewed and is accurate.   Glendell Docker, NP 08/23/14 [place] Alvino Chapel, MD 08/23/14 [phone]

## 2014-08-23 NOTE — ED Notes (Signed)
Pt reports chronic pain in l/hip, increased over last few weeks. Stated that he was told he may need surgery to relieve the pain. Physical therapy end 2 weeks ago., No relief with therapy. Seen by ortho in [facility]. MRI completed.

## 2014-11-04 ENCOUNTER — Encounter (HOSPITAL_BASED_OUTPATIENT_CLINIC_OR_DEPARTMENT_OTHER): Payer: Self-pay

## 2014-11-04 ENCOUNTER — Emergency Department (HOSPITAL_BASED_OUTPATIENT_CLINIC_OR_DEPARTMENT_OTHER): Payer: Self-pay

## 2014-11-04 ENCOUNTER — Emergency Department (HOSPITAL_BASED_OUTPATIENT_CLINIC_OR_DEPARTMENT_OTHER)
Admission: EM | Admit: 2014-11-04 | Discharge: 2014-11-04 | Disposition: A | Discharge status: Other Acute Inpatient Hospital | Payer: Self-pay | Attending: Emergency Medicine | Admitting: Emergency Medicine

## 2014-11-04 ENCOUNTER — Emergency Department (HOSPITAL_COMMUNITY): Payer: Managed Care, Other (non HMO)

## 2014-11-04 DIAGNOSIS — R29898 Other symptoms and signs involving the musculoskeletal system: Secondary | ICD-10-CM

## 2014-11-04 DIAGNOSIS — M5030 Other cervical disc degeneration, unspecified cervical region: Secondary | ICD-10-CM

## 2014-11-04 DIAGNOSIS — R531 Weakness: Secondary | ICD-10-CM | POA: Insufficient documentation

## 2014-11-04 DIAGNOSIS — M4802 Spinal stenosis, cervical region: Secondary | ICD-10-CM

## 2014-11-04 DIAGNOSIS — M5412 Radiculopathy, cervical region: Secondary | ICD-10-CM

## 2014-11-04 DIAGNOSIS — R200 Anesthesia of skin: Secondary | ICD-10-CM | POA: Insufficient documentation

## 2014-11-04 DIAGNOSIS — Y999 Unspecified external cause status: Secondary | ICD-10-CM | POA: Insufficient documentation

## 2014-11-04 DIAGNOSIS — M542 Cervicalgia: Secondary | ICD-10-CM

## 2014-11-04 DIAGNOSIS — Y929 Unspecified place or not applicable: Secondary | ICD-10-CM | POA: Insufficient documentation

## 2014-11-04 DIAGNOSIS — Y9389 Activity, other specified: Secondary | ICD-10-CM | POA: Insufficient documentation

## 2014-11-04 DIAGNOSIS — W2189XA Striking against or struck by other sports equipment, initial encounter: Secondary | ICD-10-CM | POA: Insufficient documentation

## 2014-11-04 DIAGNOSIS — S199XXA Unspecified injury of neck, initial encounter: Secondary | ICD-10-CM | POA: Insufficient documentation

## 2014-11-04 LAB — CBG MONITORING, ED: Glucose-Capillary: 95 mg/dL (ref 70–99)

## 2014-11-04 MED ORDER — OXYCODONE-ACETAMINOPHEN 5-325 MG PO TABS
2.0000 | ORAL_TABLET | Freq: Once | ORAL | Status: AC
  Administered 2014-11-04: 2 via ORAL
  Filled 2014-11-04: qty 2

## 2014-11-04 MED ORDER — OXYCODONE-ACETAMINOPHEN 5-325 MG PO TABS: 1.0000 | ORAL_TABLET | Freq: Four times a day (QID) | ORAL | Status: DC | PRN

## 2014-11-04 MED ORDER — PREDNISONE 20 MG PO TABS: 40.0000 mg | ORAL_TABLET | Freq: Every day | ORAL | Status: DC

## 2014-11-04 MED ORDER — NAPROXEN 500 MG PO TABS: 500.0000 mg | ORAL_TABLET | Freq: Two times a day (BID) | ORAL | Status: DC

## 2014-11-04 NOTE — Discharge Instructions (Signed)
Take naproxen and prednisone as prescribed. You take Percocet as prescribed for severe pain. Also recommend that you alternate ice and heat to your next 3-4 times per day. Follow-up with a neurosurgeon for further evaluation of your symptoms. Follow-up with your primary care doctor in 1 week for a recheck of symptoms. Return to the emergency department as needed if symptoms worsen.  Cervical Radiculopathy Cervical radiculopathy happens when a nerve in the neck is pinched or bruised by a slipped (herniated) disk or by arthritic changes in the bones of the cervical spine. This can occur due to an injury or as part of the normal aging process. Pressure on the cervical nerves can cause pain or numbness that runs from your neck all the way down into your arm and fingers. CAUSES  There are many possible causes, including:  Injury.  Muscle tightness in the neck from overuse.  Swollen, painful joints (arthritis).  Breakdown or degeneration in the bones and joints of the spine (spondylosis) due to aging.  Bone spurs that may develop near the cervical nerves. SYMPTOMS  Symptoms include pain, weakness, or numbness in the affected arm and hand. Pain can be severe or irritating. Symptoms may be worse when extending or turning the neck. DIAGNOSIS  Your caregiver will ask about your symptoms and do a physical exam. He or she may test your strength and reflexes. X-rays, CT scans, and MRI scans may be needed in cases of injury or if the symptoms do not go away after a period of time. Electromyography (EMG) or nerve conduction testing may be done to study how your nerves and muscles are working. TREATMENT  Your caregiver may recommend certain exercises to help relieve your symptoms. Cervical radiculopathy can, and often does, get better with time and treatment. If your problems continue, treatment options may include:  Wearing a soft collar for short periods of time.  Physical therapy to strengthen the neck  muscles.  Medicines, such as nonsteroidal anti-inflammatory drugs (NSAIDs), oral corticosteroids, or spinal injections.  Surgery. Different types of surgery may be done depending on the cause of your problems. HOME CARE INSTRUCTIONS   Put ice on the affected area.  Put ice in a plastic bag.  Place a towel between your skin and the bag.  Leave the ice on for 15-20 minutes, 03-04 times a day or as directed by your caregiver.  If ice does not help, you can try using heat. Take a warm shower or bath, or use a hot water bottle as directed by your caregiver.  You may try a gentle neck and shoulder massage.  Use a flat pillow when you sleep.  Only take over-the-counter or prescription medicines for pain, discomfort, or fever as directed by your caregiver.  If physical therapy was prescribed, follow your caregiver's directions.  If a soft collar was prescribed, use it as directed. SEEK IMMEDIATE MEDICAL CARE IF:   Your pain gets much worse and cannot be controlled with medicines.  You have weakness or numbness in your hand, arm, face, or leg.  You have a high fever or a stiff, rigid neck.  You lose bowel or bladder control (incontinence).  You have trouble with walking, balance, or speaking. MAKE SURE YOU:   Understand these instructions.  Will watch your condition.  Will get help right away if you are not doing well or get worse. Document Released: 05/23/2001 Document Revised: 11/20/2011 Document Reviewed: 04/11/2011 [facility] Patient Information 2015 [place], [place]. This information is not intended to replace  advice given to you by your health care provider. Make sure you discuss any questions you have with your health care provider.

## 2014-11-04 NOTE — ED Notes (Addendum)
Pt ws lifting weights/ 120lb-lost control-weight came down on back of neck-steady gait to triage

## 2014-11-04 NOTE — ED Provider Notes (Signed)
CSN: [id number]     Arrival date & time 11/04/14  1641 History   First MD Initiated Contact with Patient 11/04/14 1746     Chief Complaint  Patient presents with  . Neck Injury     (Consider location/radiation/quality/duration/timing/severity/associated sxs/prior Treatment) HPI Comments: Patient reports injuring his neck 3 days ago while squatting with a barbell with 170 pounds. He states the back of the barbell came down on his neck but it was not the full weight of the weights. He denies hitting head or losing consciousness. He's had persistent neck pain since that radiates into his upper extremities. He's been using heat packs and anti-inflammatories at home without relief. He endorses intermittent numbness, tingling or weakness in his left arm. No bowel or bladder incontinence. No fever or vomiting. No low back pain.  The history is provided by the patient.    Past Medical History  Diagnosis Date  . Kidney carcinoma    Past Surgical History  Procedure Laterality Date  . Nephrectomy      right  . Hernia repair    . Hand surgery     Family History  Problem Relation Age of Onset  . Cancer Mother   . Diabetes Mother   . Hypertension Mother    History  Substance Use Topics  . Smoking status: Current Some Day Smoker -- 0.50 packs/day  . Smokeless tobacco: Never Used  . Alcohol Use: No    Review of Systems  Constitutional: Negative for fever, activity change and appetite change.  Respiratory: Negative for cough, chest tightness and shortness of breath.   Cardiovascular: Negative for chest pain.  Gastrointestinal: Negative for nausea, vomiting and abdominal pain.  Genitourinary: Negative for dysuria and hematuria.  Musculoskeletal: Positive for myalgias, back pain and neck pain.  Skin: Negative for rash.  Neurological: Negative for dizziness, weakness and headaches.  A complete 10 system review of systems was obtained and all systems are negative except as noted in the HPI  and PMH.      Allergies  Review of patient's allergies indicates no known allergies.  Home Medications   Prior to Admission medications   Not on File   BP 130/74 mmHg  Pulse 93  Temp(Src) 98 F (36.7 C) (Oral)  Resp 18  Ht 5\' 9"  (1.753 m)  Wt 174 lb (78.926 kg)  BMI 25.68 kg/m2  SpO2 97% Physical Exam  Constitutional: He is oriented to person, place, and time. He appears well-developed and well-nourished. No distress.  HENT:  Head: Normocephalic and atraumatic.  Mouth/Throat: Oropharynx is clear and moist. No oropharyngeal exudate.  Eyes: Conjunctivae and EOM are normal. Pupils are equal, round, and reactive to light.  Neck:  Diffuse paraspinal and midline C-spine tenderness without step-off or deformity  Cardiovascular: Normal rate, regular rhythm, normal heart sounds and intact distal pulses.   No murmur heard. Pulmonary/Chest: Effort normal and breath sounds normal. No respiratory distress.  Abdominal: Soft. There is no tenderness. There is no rebound and no guarding.  Musculoskeletal: Normal range of motion. He exhibits no edema or tenderness.  Neurological: He is alert and oriented to person, place, and time. No cranial nerve deficit. He exhibits normal muscle tone. Coordination normal.  No ataxia on finger to nose bilaterally. No pronator drift.  CN 2-12 intact. Negative Romberg. . Sensation intact. Gait is normal.   Slightly decreased grip strength on the left. Weak flexion and extension of the forearm. 5/5 strength on the right.  Skin: Skin is warm.  Psychiatric: He has a normal mood and affect. His behavior is normal.  Nursing note and vitals reviewed.   ED Course  Procedures (including critical care time) Labs Review Labs Reviewed - No data to display  Imaging Review Dg Cervical Spine Complete  11/04/2014   CLINICAL DATA:  Patient dropped a barbell on the cervical spine 3 days ago. Pain at the C4 through C7 level radiating across the shoulders.  EXAM:  CERVICAL SPINE  4+ VIEWS  COMPARISON:  03/18/2013  FINDINGS: Normal alignment of the cervical spine and facet joints. Degenerative changes present with narrowed cervical interspaces and endplate hypertrophic changes at C4-5, C5-6, and C6-7 levels. Prominent degenerative changes in the cervical facet joints. Bone encroachment upon the neural foramina at multiple levels bilaterally. Alignment and appearance is similar to prior study. No vertebral compression deformities. No prevertebral soft tissue swelling. No focal bone lesion or bone destruction bone cortex and trabecular architecture appear intact  IMPRESSION: Multilevel degenerative changes throughout the cervical spine. Appearance is unchanged since prior study. No acute displaced fractures identified.   Electronically Signed   By: Lucienne Capers M.D.   On: 11/04/2014 17:17   Ct Cervical Spine Wo Contrast  11/04/2014   CLINICAL DATA:  Injured neck while weight lifting 3 days ago. Dropped weights on the back of his neck.  EXAM: CT CERVICAL SPINE WITHOUT CONTRAST  TECHNIQUE: Multidetector CT imaging of the cervical spine was performed without intravenous contrast. Multiplanar CT image reconstructions were also generated.  COMPARISON:  None.  FINDINGS: Normal alignment of the cervical vertebral bodies. No acute fracture or bone lesion. Moderate degenerative changes at C4-5 and C5-6 the facets are normally aligned. Moderate multilevel facet disease. No facet or laminar fractures are identified. Mild degenerative changes at C1-2. The dens is intact. The spinal canal is generous. No spinal canal compromise. Uncinate spurring changes and facet disease on the right side contribute to significant right foraminal stenosis at C3-4, C4-5 and C5-6.  The visualized lung apices are clear.  IMPRESSION: No acute cervical spine fracture.  Degenerative cervical spondylosis with multilevel disc disease and facet disease. Fairly significant foraminal stenosis on the right at  C3-4, C4-5 and C5-6 and mild bilateral foraminal stenosis at C6-7.   Electronically Signed   By: Marijo Sanes M.D.   On: 11/04/2014 19:36     EKG Interpretation None      MDM   Final diagnoses:  Neck pain  Left arm weakness   Injury 3 days ago with intermittent weakness and numbness in left arm. Triage x-ray is negative for fracture. Patient placed in cervical collar.  X-ray negative for fracture. Patient placed in cervical collar. CT scan shows multilevel cervical spondylosis and facet disease and foraminal stenosis.  With ongoing left arm weakness and numbness, patient will need MRI to evaluate for spinal cord lesion. Discussed with Dr. Jeneen Rinks at [facility]. Patient's family will transport him by private vehicle. He is instructed to keep his c-collar on.  Ezequiel Essex, MD 11/04/14 [phone]

## 2014-11-04 NOTE — ED Notes (Signed)
Pt wife came to pick him up to transport to [facility] ED for MRI. Pt Instructed not to eat or drink prior to getting to [facility].

## 2014-11-04 NOTE — ED Notes (Signed)
Patient asked to change into a gown.  

## 2014-11-04 NOTE — ED Notes (Signed)
PA at BS.  

## 2014-11-04 NOTE — ED Provider Notes (Signed)
2138 - patient arrives to [facility] ED via personal vehicle from [facility]. Patient was evaluated earlier today for worsening neck pain. Patient reports during my encounter that he had been having intermittent neck pain for quite some time, but this worsened 3 days ago while squatting with a barbell with approximately 170 pounds. Patient states that he has been experiencing a sharp pain radiating intermittently down his right upper extremity. He states that this would cause his left hand to "lock up" at times and he would need to "stretch it out" to resolve his symptoms. He states he is not experiencing discomfort at this time, since being placed in a cervical collar. Patient denies a hx of IVDU. He does report a hx of kidney CA; s/p R nephrectomy  Physical Exam  Constitutional: He is well-developed, well-nourished, and in no distress. No distress.  HENT:  Head: Normocephalic and atraumatic.  Neck:  Cervical collar in place  Cardiovascular: Normal rate, regular rhythm and intact distal pulses.   Distal radial pulse 2+ in bilateral upper extremities  Pulmonary/Chest: Effort normal. No respiratory distress.  Respirations even and unlabored  Neurological: He is alert. He exhibits normal muscle tone. Coordination normal.  Sensation to light touch intact. Normal grip strength and strength against resistance in bilateral upper extremities.  Skin: Skin is warm and dry. He is not diaphoretic. No erythema. No pallor.  Psychiatric: Affect and judgment normal.  Nursing note and vitals reviewed.  Plan to proceed with MR cervical spine without contrast. Prior Xray and CT cervical spine are negative for acute changes.  2328 - MR findings reviewed with patient. MRI shows multilevel degenerative disc disease including severe bilateral foraminal narrowing at C3/4 through C7/T1. Will manage symptoms as outpatient with Prednisone taper and pain medicine as well as referral to neurosurgery. Plan discussed with patient  who verbalizes comfort and understanding with plan. Return precautions given. Patient discharged in good condition with no unaddressed concerns.   Results for orders placed or performed during the hospital encounter of 11/04/14  CBG monitoring, ED  Result Value Ref Range   Glucose-Capillary 95 70 - 99 mg/dL   Dg Cervical Spine Complete  11/04/2014   CLINICAL DATA:  Patient dropped a barbell on the cervical spine 3 days ago. Pain at the C4 through C7 level radiating across the shoulders.  EXAM: CERVICAL SPINE  4+ VIEWS  COMPARISON:  03/18/2013  FINDINGS: Normal alignment of the cervical spine and facet joints. Degenerative changes present with narrowed cervical interspaces and endplate hypertrophic changes at C4-5, C5-6, and C6-7 levels. Prominent degenerative changes in the cervical facet joints. Bone encroachment upon the neural foramina at multiple levels bilaterally. Alignment and appearance is similar to prior study. No vertebral compression deformities. No prevertebral soft tissue swelling. No focal bone lesion or bone destruction bone cortex and trabecular architecture appear intact  IMPRESSION: Multilevel degenerative changes throughout the cervical spine. Appearance is unchanged since prior study. No acute displaced fractures identified.   Electronically Signed   By: Lucienne Capers M.D.   On: 11/04/2014 17:17   Ct Cervical Spine Wo Contrast  11/04/2014   CLINICAL DATA:  Injured neck while weight lifting 3 days ago. Dropped weights on the back of his neck.  EXAM: CT CERVICAL SPINE WITHOUT CONTRAST  TECHNIQUE: Multidetector CT imaging of the cervical spine was performed without intravenous contrast. Multiplanar CT image reconstructions were also generated.  COMPARISON:  None.  FINDINGS: Normal alignment of the cervical vertebral bodies. No acute fracture or bone lesion.  Moderate degenerative changes at C4-5 and C5-6 the facets are normally aligned. Moderate multilevel facet disease. No facet or  laminar fractures are identified. Mild degenerative changes at C1-2. The dens is intact. The spinal canal is generous. No spinal canal compromise. Uncinate spurring changes and facet disease on the right side contribute to significant right foraminal stenosis at C3-4, C4-5 and C5-6.  The visualized lung apices are clear.  IMPRESSION: No acute cervical spine fracture.  Degenerative cervical spondylosis with multilevel disc disease and facet disease. Fairly significant foraminal stenosis on the right at C3-4, C4-5 and C5-6 and mild bilateral foraminal stenosis at C6-7.   Electronically Signed   By: Marijo Sanes M.D.   On: 11/04/2014 19:36   Mr Cervical Spine Wo Contrast  11/04/2014   CLINICAL DATA:  Initial evaluation for recent neck injury. Also with pain radiating into upper extremities with intermittent numbness, tingling in left arm.  EXAM: MRI CERVICAL SPINE WITHOUT CONTRAST  TECHNIQUE: Multiplanar, multisequence MR imaging of the cervical spine was performed. No intravenous contrast was administered.  COMPARISON:  Prior CT from earlier the same day.  FINDINGS: Visualized portions of the brain and posterior fossa demonstrate a normal appearance with normal signal intensity. Craniocervical junction widely patent.  There is trace anterolisthesis of C7 on T1. Otherwise, vertebral bodies are normally aligned with preservation of the normal cervical lordosis. Vertebral body heights are maintained. No acute fracture. No evidence for ligamentous injury.  Signal intensity within the cervical spinal cord is normal. No epidural fluid collection.  Paraspinous soft tissues within normal limits. Normal intravascular flow voids present within the vertebral arteries bilaterally.  C2-3: Left-sided uncovertebral hypertrophy with mild left-sided facet arthrosis. There is resultant mild left foraminal narrowing. No significant central canal or right neural foraminal stenosis.  C3-4: Diffuse degenerative disc osteophyte with  right greater than left uncovertebral spurring and facet arthropathy. There is resultant severe bilateral foraminal stenosis, right worse than left. Posterior disc osteophyte mildly effaces the ventral thecal sac and results in mild canal narrowing.  C4-5: Diffuse degenerative disc osteophyte with bilateral uncovertebral spurring and facet arthrosis. There is resultant fairly severe bilateral foraminal narrowing, right slightly worse than left. Posterior disc osteophyte partially effaces the ventral thecal sac and results in mild canal stenosis.  C5-6: Diffuse multi focal degenerative disc osteophyte with bilateral uncovertebral spurring, slightly worse on the right. There is mild bilateral facet arthrosis. There is resultant fairly severe bilateral foraminal narrowing, right worse than left. Degenerative disc osteophyte and facet arthrodesis results in mild central canal stenosis.  C6-7: Bilateral uncovertebral spurring, left worse than right with bilateral facet arthrosis, also worse on the left. There is resultant severe left with moderate to severe right foraminal narrowing. No significant central canal stenosis.  C7-T1: Trace anterior listhesis with bilateral facet arthrosis. There is resultant moderate to severe bilateral foraminal narrowing with mild central canal stenosis.  IMPRESSION: 1. No MRI evidence for acute traumatic injury within the cervical spine. 2. Multilevel degenerative disc disease and facet arthropathy as detailed above, with resultant fairly severe bilateral foraminal narrowing at C3-4 through C7-T1. Please see the above report for full description of these findings. 3. Mild central canal narrowing from C3-4 through C5-6, as well as at C7-T1. No evidence for cord compression or severe canal narrowing.   Electronically Signed   By: Jeannine Boga M.D.   On: 11/04/2014 23:18      Antonietta Breach, PA-C 11/04/14 [place], MD 11/04/14 [phone]

## 2014-11-04 NOTE — ED Notes (Signed)
MD at bedside. 

## 2014-11-22 ENCOUNTER — Emergency Department (HOSPITAL_COMMUNITY): Payer: Managed Care, Other (non HMO)

## 2014-11-22 ENCOUNTER — Encounter (HOSPITAL_COMMUNITY): Payer: Self-pay

## 2014-11-22 ENCOUNTER — Emergency Department (HOSPITAL_COMMUNITY)
Admission: EM | Admit: 2014-11-22 | Discharge: 2014-11-22 | Disposition: A | Discharge status: Home / Self Care | Payer: Managed Care, Other (non HMO) | Attending: Emergency Medicine | Admitting: Emergency Medicine

## 2014-11-22 DIAGNOSIS — Z720 Tobacco use: Secondary | ICD-10-CM | POA: Insufficient documentation

## 2014-11-22 DIAGNOSIS — Z7952 Long term (current) use of systemic steroids: Secondary | ICD-10-CM | POA: Insufficient documentation

## 2014-11-22 DIAGNOSIS — Z85528 Personal history of other malignant neoplasm of kidney: Secondary | ICD-10-CM | POA: Insufficient documentation

## 2014-11-22 DIAGNOSIS — Z791 Long term (current) use of non-steroidal anti-inflammatories (NSAID): Secondary | ICD-10-CM | POA: Insufficient documentation

## 2014-11-22 DIAGNOSIS — M79642 Pain in left hand: Secondary | ICD-10-CM

## 2014-11-22 MED ORDER — OXYCODONE-ACETAMINOPHEN 5-325 MG PO TABS: 1.0000 | ORAL_TABLET | Freq: Four times a day (QID) | ORAL | Status: DC | PRN

## 2014-11-22 MED ORDER — HYDROCODONE-ACETAMINOPHEN 5-325 MG PO TABS
2.0000 | ORAL_TABLET | Freq: Once | ORAL | Status: AC
  Administered 2014-11-22: 2 via ORAL
  Filled 2014-11-22: qty 2

## 2014-11-22 NOTE — ED Notes (Signed)
Questions, concerns denied. Pt ambulatory and a&ox4

## 2014-11-22 NOTE — Discharge Instructions (Signed)

## 2014-11-22 NOTE — ED Provider Notes (Signed)
CSN: [id number]     Arrival date & time 11/22/14  5366 History   First MD Initiated Contact with Patient 11/22/14 [phone]     Chief Complaint  Patient presents with  . Hand Pain     (Consider location/radiation/quality/duration/timing/severity/associated sxs/prior Treatment) HPI Comments: L hand described as sharp, aching. Location - L dorsal hand over 2nd and 3rd MCP.  Patient is a 54 y.o. male presenting with hand pain. The history is provided by the patient.  Hand Pain This is a new problem. The current episode started more than 2 days ago. The problem occurs constantly. The problem has not changed since onset.Pertinent negatives include no abdominal pain and no shortness of breath. Nothing aggravates the symptoms. Nothing relieves the symptoms. He has tried nothing for the symptoms.    Past Medical History  Diagnosis Date  . Kidney carcinoma    Past Surgical History  Procedure Laterality Date  . Nephrectomy      right  . Hernia repair    . Hand surgery     Family History  Problem Relation Age of Onset  . Cancer Mother   . Diabetes Mother   . Hypertension Mother    History  Substance Use Topics  . Smoking status: Current Some Day Smoker -- 0.50 packs/day  . Smokeless tobacco: Never Used  . Alcohol Use: No    Review of Systems  Constitutional: Negative for fever.  Respiratory: Negative for shortness of breath.   Gastrointestinal: Negative for vomiting and abdominal pain.  All other systems reviewed and are negative.     Allergies  Review of patient's allergies indicates no known allergies.  Home Medications   Prior to Admission medications   Medication Sig Start Date End Date Taking? Authorizing Provider  naproxen (NAPROSYN) 500 MG tablet Take 1 tablet (500 mg total) by mouth 2 (two) times daily. 11/04/14   Antonietta Breach, PA-C  oxyCODONE-acetaminophen (PERCOCET/ROXICET) 5-325 MG per tablet Take 1-2 tablets by mouth every 6 (six) hours as needed for severe pain.  11/04/14   Antonietta Breach, PA-C  predniSONE (DELTASONE) 20 MG tablet Take 2 tablets (40 mg total) by mouth daily. Take 40 mg by mouth daily for 6 days, then 20mg  by mouth daily for 6 days, then 10mg  daily for 6 days 11/04/14   Antonietta Breach, PA-C   BP 136/90 mmHg  Pulse 78  Temp(Src) 97.6 F (36.4 C) (Oral)  Resp 16  SpO2 100% Physical Exam  Constitutional: He is oriented to person, place, and time. He appears well-developed and well-nourished. No distress.  HENT:  Head: Normocephalic and atraumatic.  Mouth/Throat: No oropharyngeal exudate.  Eyes: EOM are normal. Pupils are equal, round, and reactive to light.  Neck: Normal range of motion. Neck supple.  Cardiovascular: Normal rate and regular rhythm.  Exam reveals no friction rub.   No murmur heard. Pulmonary/Chest: Effort normal and breath sounds normal. No respiratory distress. He has no wheezes. He has no rales.  Abdominal: He exhibits no distension. There is no tenderness. There is no rebound.  Musculoskeletal: Normal range of motion. He exhibits no edema.       Hands: Neurological: He is alert and oriented to person, place, and time.  Skin: He is not diaphoretic.  Nursing note and vitals reviewed.   ED Course  Procedures (including critical care time) Labs Review Labs Reviewed - No data to display  Imaging Review No results found.   EKG Interpretation None      MDM   Final  diagnoses:  Left hand pain    54 year old male here with left hand pain. Began the past few days ago. A couple weeks ago he was diagnosed with cervical stenosis after having some intermittent right and left arm pains. He had an MRI at [facility] cone showing this. He has an appointment with neurology in 5 days. 3 days ago this left hand became more painful. Is mostly in the left second and third MCP area. He has no issues long the pinky and ulnar edge of the fourth finger. He states no real pain on the palm or the thumb. No palmar issues. No pain in the  forearm or upper arm. Pain is described as sharp aching. Normal sensation. On exam he has mild swelling of the left third MCP. He has good pulses and good cap refill in all fingers. 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 His pain does seem radicular and then it's on the C7 area. Will xray since hand is swollen. Pain meds given.   Xray negative. Will discharge home with pain meds and Neuro f/u.  Evelina Bucy, MD 11/22/14 1110

## 2014-11-22 NOTE — ED Notes (Signed)
Patient c/o increased pain to the left hand. Patient reports tht he has an appointment with a neurologist in 5 days, but pain has increased.

## 2015-02-07 ENCOUNTER — Emergency Department (HOSPITAL_BASED_OUTPATIENT_CLINIC_OR_DEPARTMENT_OTHER)
Admission: EM | Admit: 2015-02-07 | Discharge: 2015-02-07 | Disposition: A | Discharge status: Home / Self Care | Payer: Managed Care, Other (non HMO) | Attending: Emergency Medicine | Admitting: Emergency Medicine

## 2015-02-07 ENCOUNTER — Emergency Department (HOSPITAL_BASED_OUTPATIENT_CLINIC_OR_DEPARTMENT_OTHER): Payer: Managed Care, Other (non HMO)

## 2015-02-07 ENCOUNTER — Encounter (HOSPITAL_BASED_OUTPATIENT_CLINIC_OR_DEPARTMENT_OTHER): Payer: Self-pay | Admitting: Emergency Medicine

## 2015-02-07 DIAGNOSIS — W228XXA Striking against or struck by other objects, initial encounter: Secondary | ICD-10-CM | POA: Insufficient documentation

## 2015-02-07 DIAGNOSIS — Y990 Civilian activity done for income or pay: Secondary | ICD-10-CM | POA: Insufficient documentation

## 2015-02-07 DIAGNOSIS — Z720 Tobacco use: Secondary | ICD-10-CM | POA: Insufficient documentation

## 2015-02-07 DIAGNOSIS — Y9289 Other specified places as the place of occurrence of the external cause: Secondary | ICD-10-CM | POA: Insufficient documentation

## 2015-02-07 DIAGNOSIS — Z85528 Personal history of other malignant neoplasm of kidney: Secondary | ICD-10-CM | POA: Insufficient documentation

## 2015-02-07 DIAGNOSIS — Y9389 Activity, other specified: Secondary | ICD-10-CM | POA: Insufficient documentation

## 2015-02-07 DIAGNOSIS — S63502A Unspecified sprain of left wrist, initial encounter: Secondary | ICD-10-CM | POA: Insufficient documentation

## 2015-02-07 DIAGNOSIS — S60222A Contusion of left hand, initial encounter: Secondary | ICD-10-CM | POA: Insufficient documentation

## 2015-02-07 MED ORDER — TRAMADOL HCL 50 MG PO TABS: 50.0000 mg | ORAL_TABLET | Freq: Four times a day (QID) | ORAL | Status: DC | PRN

## 2015-02-07 MED ORDER — IBUPROFEN 800 MG PO TABS: 800.0000 mg | ORAL_TABLET | Freq: Three times a day (TID) | ORAL | Status: DC

## 2015-02-07 NOTE — ED Notes (Signed)
Pt in c/o wrist pain after hitting it on a machine at work yesterday. No swelling or deformity noted.

## 2015-02-07 NOTE — ED Provider Notes (Signed)
CSN: [id number]     Arrival date & time 02/07/15  1241 History   First MD Initiated Contact with Patient 02/07/15 1334     Chief Complaint  Patient presents with  . Wrist Pain     (Consider location/radiation/quality/duration/timing/severity/associated sxs/prior Treatment) HPI Comments: 54 year old male complaining of left wrist and hand pain after pulling something out of the machine and accidentally hitting his hand onto the machine. States he felt a pull to his wrist, and has had increased pain in his wrist and in his second and third knuckles since hitting it on the machine. Pain worse with certain movements, 6/30. Denies numbness or tingling. Has not tried any alleviating factors.  Patient is a 54 y.o. male presenting with wrist pain. The history is provided by the patient.  Wrist Pain Pertinent negatives include no fever.    Past Medical History  Diagnosis Date  . Kidney carcinoma    Past Surgical History  Procedure Laterality Date  . Nephrectomy      right  . Hernia repair    . Hand surgery     Family History  Problem Relation Age of Onset  . Cancer Mother   . Diabetes Mother   . Hypertension Mother    History  Substance Use Topics  . Smoking status: Current Some Day Smoker -- 0.50 packs/day  . Smokeless tobacco: Never Used  . Alcohol Use: No    Review of Systems  Constitutional: Negative for fever.  Musculoskeletal:       + L wrist and hand pain.      Allergies  Review of patient's allergies indicates no known allergies.  Home Medications   Prior to Admission medications   Medication Sig Start Date End Date Taking? Authorizing Provider  ibuprofen (ADVIL,MOTRIN) 200 MG tablet Take 600 mg by mouth every 6 (six) hours as needed for headache, mild pain or moderate pain.    Historical Provider, MD  ibuprofen (ADVIL,MOTRIN) 800 MG tablet Take 1 tablet (800 mg total) by mouth 3 (three) times daily. 02/07/15   Capricia Serda M Abegail Kloeppel, PA-C  naproxen (NAPROSYN) 500 MG  tablet Take 1 tablet (500 mg total) by mouth 2 (two) times daily. 11/04/14   Antonietta Breach, PA-C  oxyCODONE-acetaminophen (PERCOCET/ROXICET) 5-325 MG per tablet Take 1 tablet by mouth every 6 (six) hours as needed for moderate pain or severe pain. 11/22/14   Evelina Bucy, MD  predniSONE (DELTASONE) 20 MG tablet Take 2 tablets (40 mg total) by mouth daily. Take 40 mg by mouth daily for 6 days, then 20mg  by mouth daily for 6 days, then 10mg  daily for 6 days 11/04/14   Antonietta Breach, PA-C  traMADol (ULTRAM) 50 MG tablet Take 1 tablet (50 mg total) by mouth every 6 (six) hours as needed. 02/07/15   Siddhanth Denk M Kazaria Gaertner, PA-C   BP 137/91 mmHg  Pulse 73  Temp(Src) 97.9 F (36.6 C) (Oral)  Ht 5\' 9"  (1.753 m)  Wt 174 lb (78.926 kg)  BMI 25.68 kg/m2  SpO2 99% Physical Exam  Constitutional: He is oriented to person, place, and time. He appears well-developed and well-nourished. No distress.  HENT:  Head: Normocephalic and atraumatic.  Eyes: Conjunctivae and EOM are normal.  Neck: Normal range of motion. Neck supple.  Cardiovascular: Normal rate, regular rhythm and normal heart sounds.   Pulmonary/Chest: Effort normal and breath sounds normal.  Musculoskeletal: Normal range of motion.  L wrist TTP over distal radius. FROM. No swelling or deformity. +2 radial pulse. L hand TTP  over 2nd and 3rd MCP with mild swelling. ROM limited by pain. Cap refill < 2 seconds. Skin intact.  Neurological: He is alert and oriented to person, place, and time.  Skin: Skin is warm and dry.  Psychiatric: He has a normal mood and affect. His behavior is normal.  Nursing note and vitals reviewed.   ED Course  Procedures (including critical care time) Labs Review Labs Reviewed - No data to display  Imaging Review Dg Wrist Complete Left  02/07/2015   CLINICAL DATA:  machine at work came down on left hand and wrist, swelling to distal metacarpal area 3rd-5th, pain No previous surgeries  EXAM: LEFT WRIST - COMPLETE 3+ VIEW   COMPARISON:  11/22/2014  FINDINGS: There is no evidence of fracture or dislocation. There is no evidence of arthropathy or other focal bone abnormality. Soft tissues are unremarkable.  IMPRESSION: Negative.   Electronically Signed   By: Lucrezia Europe M.D.   On: 02/07/2015 13:16     EKG Interpretation None      MDM   Final diagnoses:  Hand contusion, left, initial encounter  Left wrist sprain, initial encounter   NAD. Neurovascularly intact. X-ray of wrist negative. I personally reviewed the x-ray to evaluate if the image was able to view the second and third MCP which it was able to do so. These appear normal. Ace wrap applied. Advised rice and NSAIDs. Tramadol for pain. Stable for discharge. Follow-up with PCP. Return precautions given. Patient states understanding of treatment care plan and is agreeable.  Carman Ching, PA-C 02/07/15 1411  Blanchie Dessert, MD 02/08/15 1001

## 2015-02-07 NOTE — Discharge Instructions (Signed)
Take tramadol as prescribed. Take ibuprofen as prescribed for swelling. Ice and elevate your wrist and hand.  Hand Contusion A hand contusion is a deep bruise on your hand area. Contusions are the result of an injury that caused bleeding under the skin. The contusion may turn blue, purple, or yellow. Minor injuries will give you a painless contusion, but more severe contusions may stay painful and swollen for a few weeks. CAUSES  A contusion is usually caused by a blow, trauma, or direct force to an area of the body. SYMPTOMS   Swelling and redness of the injured area.  Discoloration of the injured area.  Tenderness and soreness of the injured area.  Pain. DIAGNOSIS  The diagnosis can be made by taking a history and performing a physical exam. An X-ray, CT scan, or MRI may be needed to determine if there were any associated injuries, such as broken bones (fractures). TREATMENT  Often, the best treatment for a hand contusion is resting, elevating, icing, and applying cold compresses to the injured area. Over-the-counter medicines may also be recommended for pain control. HOME CARE INSTRUCTIONS   Put ice on the injured area.  Put ice in a plastic bag.  Place a towel between your skin and the bag.  Leave the ice on for 15-20 minutes, 03-04 times a day.  Only take over-the-counter or prescription medicines as directed by your caregiver. Your caregiver may recommend avoiding anti-inflammatory medicines (aspirin, ibuprofen, and naproxen) for 48 hours because these medicines may increase bruising.  If told, use an elastic wrap as directed. This can help reduce swelling. You may remove the wrap for sleeping, showering, and bathing. If your fingers become numb, cold, or blue, take the wrap off and reapply it more loosely.  Elevate your hand with pillows to reduce swelling.  Avoid overusing your hand if it is painful. SEEK IMMEDIATE MEDICAL CARE IF:   You have increased redness, swelling,  or pain in your hand.  Your swelling or pain is not relieved with medicines.  You have loss of feeling in your hand or are unable to move your fingers.  Your hand turns cold or blue.  You have pain when you move your fingers.  Your hand becomes warm to the touch.  Your contusion does not improve in 2 days. MAKE SURE YOU:   Understand these instructions.  Will watch your condition.  Will get help right away if you are not doing well or get worse. Document Released: 02/17/2002 Document Revised: 05/22/2012 Document Reviewed: 02/19/2012 [facility] Patient Information 2015 [place], [place]. This information is not intended to replace advice given to you by your health care provider. Make sure you discuss any questions you have with your health care provider.  Sprain A sprain is a tear in one of the strong, fibrous tissues that connect your bones (ligaments). The severity of the sprain depends on how much of the ligament is torn. The tear can be either partial or complete. CAUSES  Often, sprains are a result of a fall or an injury. The force of the impact causes the fibers of your ligament to stretch beyond their normal length. This excess tension causes the fibers of your ligament to tear. SYMPTOMS  You may have some loss of motion or increased pain within your normal range of motion. Other symptoms include:  Bruising.  Tenderness.  Swelling. DIAGNOSIS  In order to diagnose a sprain, your caregiver will physically examine you to determine how torn the ligament is. Your caregiver  may also suggest an X-ray exam to make sure no bones are broken. TREATMENT  If your ligament is only partially torn, treatment usually involves keeping the injured area in a fixed position (immobilization) for a short period. To do this, your caregiver will apply a bandage, cast, or splint to keep the area from moving until it heals. For a partially torn ligament, the healing process usually takes 2 to 3  weeks. If your ligament is completely torn, you may need surgery to reconnect the ligament to the bone or to reconstruct the ligament. After surgery, a cast or splint may be applied and will need to stay on for 4 to 6 weeks while your ligament heals. HOME CARE INSTRUCTIONS  Keep the injured area elevated to decrease swelling.  To ease pain and swelling, apply ice to your joint twice a day, for 2 to 3 days.  Put ice in a plastic bag.  Place a towel between your skin and the bag.  Leave the ice on for 15 minutes.  Only take over-the-counter or prescription medicine for pain as directed by your caregiver.  Do not leave the injured area unprotected until pain and stiffness go away (usually 3 to 4 weeks).  Do not allow your cast or splint to get wet. Cover your cast or splint with a plastic bag when you shower or bathe. Do not swim.  Your caregiver may suggest exercises for you to do during your recovery to prevent or limit permanent stiffness. SEEK IMMEDIATE MEDICAL CARE IF:  Your cast or splint becomes damaged.  Your pain becomes worse. MAKE SURE YOU:  Understand these instructions.  Will watch your condition.  Will get help right away if you are not doing well or get worse. Document Released: 08/25/2000 Document Revised: 11/20/2011 Document Reviewed: 09/09/2011 [facility] Patient Information 2015 [place], [place]. This information is not intended to replace advice given to you by your health care provider. Make sure you discuss any questions you have with your health care provider. RICE: Routine Care for Injuries The routine care of many injuries includes Rest, Ice, Compression, and Elevation (RICE). HOME CARE INSTRUCTIONS  Rest is needed to allow your body to heal. Routine activities can usually be resumed when comfortable. Injured tendons and bones can take up to 6 weeks to heal. Tendons are the cord-like structures that attach muscle to bone.  Ice following an injury helps keep  the swelling down and reduces pain.  Put ice in a plastic bag.  Place a towel between your skin and the bag.  Leave the ice on for 15-20 minutes, 3-4 times a day, or as directed by your health care provider. Do this while awake, for the first 24 to 48 hours. After that, continue as directed by your caregiver.  Compression helps keep swelling down. It also gives support and helps with discomfort. If an elastic bandage has been applied, it should be removed and reapplied every 3 to 4 hours. It should not be applied tightly, but firmly enough to keep swelling down. Watch fingers or toes for swelling, bluish discoloration, coldness, numbness, or excessive pain. If any of these problems occur, remove the bandage and reapply loosely. Contact your caregiver if these problems continue.  Elevation helps reduce swelling and decreases pain. With extremities, such as the arms, hands, legs, and feet, the injured area should be placed near or above the level of the heart, if possible. SEEK IMMEDIATE MEDICAL CARE IF:  You have persistent pain and swelling.  You  develop redness, numbness, or unexpected weakness.  Your symptoms are getting worse rather than improving after several days. These symptoms may indicate that further evaluation or further X-rays are needed. Sometimes, X-rays may not show a small broken bone (fracture) until 1 week or 10 days later. Make a follow-up appointment with your caregiver. Ask when your X-ray results will be ready. Make sure you get your X-ray results. Document Released: 12/10/2000 Document Revised: 09/02/2013 Document Reviewed: 01/27/2011 [facility] Patient Information 2015 [place], [place]. This information is not intended to replace advice given to you by your health care provider. Make sure you discuss any questions you have with your health care provider.

## 2015-07-04 ENCOUNTER — Encounter (HOSPITAL_COMMUNITY): Payer: Self-pay | Admitting: *Deleted

## 2015-07-04 ENCOUNTER — Emergency Department (HOSPITAL_COMMUNITY)
Admission: EM | Admit: 2015-07-04 | Discharge: 2015-07-04 | Disposition: A | Discharge status: Home / Self Care | Payer: Managed Care, Other (non HMO) | Attending: Emergency Medicine | Admitting: Emergency Medicine

## 2015-07-04 DIAGNOSIS — M545 Low back pain, unspecified: Secondary | ICD-10-CM

## 2015-07-04 DIAGNOSIS — Z85528 Personal history of other malignant neoplasm of kidney: Secondary | ICD-10-CM | POA: Insufficient documentation

## 2015-07-04 DIAGNOSIS — Z791 Long term (current) use of non-steroidal anti-inflammatories (NSAID): Secondary | ICD-10-CM | POA: Insufficient documentation

## 2015-07-04 DIAGNOSIS — Z720 Tobacco use: Secondary | ICD-10-CM | POA: Insufficient documentation

## 2015-07-04 DIAGNOSIS — Z7952 Long term (current) use of systemic steroids: Secondary | ICD-10-CM | POA: Insufficient documentation

## 2015-07-04 MED ORDER — TRAMADOL HCL 50 MG PO TABS: 50.0000 mg | ORAL_TABLET | Freq: Four times a day (QID) | ORAL | Status: DC | PRN

## 2015-07-04 MED ORDER — CYCLOBENZAPRINE HCL 10 MG PO TABS: 10.0000 mg | ORAL_TABLET | Freq: Two times a day (BID) | ORAL | Status: DC | PRN

## 2015-07-04 NOTE — Discharge Instructions (Signed)

## 2015-07-04 NOTE — ED Notes (Signed)
PT reports he did not use proper body posture while picking up a heavy box. Pt now has lower back pain.

## 2015-07-04 NOTE — ED Provider Notes (Signed)
CSN: [id number]     Arrival date & time 07/04/15  4196 History  By signing my name below, I, Rayna Sexton, attest that this documentation has been prepared under the direction and in the presence of Glendell Docker, NP. Electronically Signed: Rayna Sexton, ED Scribe. 07/04/2015. 9:06 AM.   Chief Complaint  Patient presents with  . Back Pain   The history is provided by the patient. No language interpreter was used.    HPI Comments: Joshua Lewis is a 54 y.o. male who presents to the Emergency Department complaining of constant, moderate, lower back pain with onset yesterday. Pt notes that he was bending over doing heavy lifting with his pain beginning immediately thereafter. He notes worsening pain with ambulation, when sitting or when in certain positions.Pt notes taking 1x muscle relaxer and 1x 800 mg ibuprofen which provided little relief. Pt notes that he urinates 4-5x per night but further notes this is baseline for him. Pt denies any incontinence of his bowels or bladder, numbness or weakness.   Past Medical History  Diagnosis Date  . Kidney carcinoma [facility])    Past Surgical History  Procedure Laterality Date  . Nephrectomy      right  . Hernia repair    . Hand surgery     Family History  Problem Relation Age of Onset  . Cancer Mother   . Diabetes Mother   . Hypertension Mother    Social History  Substance Use Topics  . Smoking status: Current Some Day Smoker -- 0.50 packs/day  . Smokeless tobacco: Never Used  . Alcohol Use: No    Review of Systems  Musculoskeletal: Positive for myalgias and back pain.  All other systems reviewed and are negative.  Allergies  Review of patient's allergies indicates no known allergies.  Home Medications   Prior to Admission medications   Medication Sig Start Date End Date Taking? Authorizing Provider  ibuprofen (ADVIL,MOTRIN) 200 MG tablet Take 600 mg by mouth every 6 (six) hours as needed for headache, mild pain or moderate  pain.    Historical Provider, MD  ibuprofen (ADVIL,MOTRIN) 800 MG tablet Take 1 tablet (800 mg total) by mouth 3 (three) times daily. 02/07/15   Robyn M Hess, PA-C  naproxen (NAPROSYN) 500 MG tablet Take 1 tablet (500 mg total) by mouth 2 (two) times daily. 11/04/14   Antonietta Breach, PA-C  oxyCODONE-acetaminophen (PERCOCET/ROXICET) 5-325 MG per tablet Take 1 tablet by mouth every 6 (six) hours as needed for moderate pain or severe pain. 11/22/14   Evelina Bucy, MD  predniSONE (DELTASONE) 20 MG tablet Take 2 tablets (40 mg total) by mouth daily. Take 40 mg by mouth daily for 6 days, then 20mg  by mouth daily for 6 days, then 10mg  daily for 6 days 11/04/14   Antonietta Breach, PA-C  traMADol (ULTRAM) 50 MG tablet Take 1 tablet (50 mg total) by mouth every 6 (six) hours as needed. 02/07/15   Carman Ching, PA-C   There were no vitals taken for this visit. Physical Exam  Constitutional: He is oriented to person, place, and time. He appears well-developed and well-nourished.  HENT:  Head: Normocephalic and atraumatic.  Mouth/Throat: No oropharyngeal exudate.  Neck: Normal range of motion. No tracheal deviation present.  Cardiovascular: Normal rate.   Pulmonary/Chest: Effort normal. No respiratory distress.  Abdominal: Soft. There is no tenderness.  Musculoskeletal: Normal range of motion.  Lumbar spine and paraspinal tenderness  Neurological: He is alert and oriented to person, place, and time.  He exhibits normal muscle tone. Coordination normal.  Bilateral strength and sensation to lower extremities. Able to do bilateral straight leg raises  Skin: Skin is warm and dry. He is not diaphoretic.  Psychiatric: He has a normal mood and affect. His behavior is normal.  Nursing note and vitals reviewed.  ED Course  Procedures  DIAGNOSTIC STUDIES: Oxygen Saturation is 100% on RA, normal by my interpretation.    COORDINATION OF CARE: 9:03 AM Pt presents today due to moderate lower back pain after doing heavy  lifting. Discussed treatment plan with pt at bedside including rx's for pain management and muscle relaxation. Return precautions noted. Pt agreed to plan.  Labs Review Labs Reviewed - No data to display  Imaging Review No results found. I have personally reviewed and evaluated these images and lab results as part of my medical decision-making.   EKG Interpretation None      MDM   Final diagnoses:  Bilateral low back pain without sciatica    No red flags. Don't think imaging is needed a this time as happened acutely yesterday. Will treat with tramadol and flexeril. Pt given return precautions and ortho follow up  I personally performed the services described in this documentation, which was scribed in my presence. The recorded information has been reviewed and is accurate.    Glendell Docker, NP 07/04/15 1751  Wandra Arthurs, MD 07/04/15 1540

## 2016-01-23 ENCOUNTER — Emergency Department (HOSPITAL_COMMUNITY)
Admission: EM | Admit: 2016-01-23 | Discharge: 2016-01-23 | Disposition: A | Discharge status: Home / Self Care | Payer: Managed Care, Other (non HMO) | Attending: Emergency Medicine | Admitting: Emergency Medicine

## 2016-01-23 ENCOUNTER — Encounter (HOSPITAL_COMMUNITY): Payer: Self-pay | Admitting: *Deleted

## 2016-01-23 DIAGNOSIS — F17200 Nicotine dependence, unspecified, uncomplicated: Secondary | ICD-10-CM | POA: Insufficient documentation

## 2016-01-23 DIAGNOSIS — M5432 Sciatica, left side: Secondary | ICD-10-CM | POA: Insufficient documentation

## 2016-01-23 DIAGNOSIS — Z79899 Other long term (current) drug therapy: Secondary | ICD-10-CM | POA: Insufficient documentation

## 2016-01-23 DIAGNOSIS — Z85528 Personal history of other malignant neoplasm of kidney: Secondary | ICD-10-CM | POA: Insufficient documentation

## 2016-01-23 MED ORDER — NAPROXEN 250 MG PO TABS: 250.0000 mg | ORAL_TABLET | Freq: Two times a day (BID) | ORAL | Status: DC

## 2016-01-23 MED ORDER — METHOCARBAMOL 500 MG PO TABS: 500.0000 mg | ORAL_TABLET | Freq: Two times a day (BID) | ORAL | Status: DC | PRN

## 2016-01-23 NOTE — ED Notes (Signed)
PT states L leg pain for several months.  Thinks he has sciatica.

## 2016-01-23 NOTE — Discharge Instructions (Signed)
Back Exercises The following exercises strengthen the muscles that help to support the back. They also help to keep the lower back flexible. Doing these exercises can help to prevent back pain or lessen existing pain. If you have back pain or discomfort, try doing these exercises 2-3 times each day or as told by your health care provider. When the pain goes away, do them once each day, but increase the number of times that you repeat the steps for each exercise (do more repetitions). If you do not have back pain or discomfort, do these exercises once each day or as told by your health care provider. EXERCISES Single Knee to Chest Repeat these steps 3-5 times for each leg:  Lie on your back on a firm bed or the floor with your legs extended.  Bring one knee to your chest. Your other leg should stay extended and in contact with the floor.  Hold your knee in place by grabbing your knee or thigh.  Pull on your knee until you feel a gentle stretch in your lower back.  Hold the stretch for 10-30 seconds.  Slowly release and straighten your leg. Pelvic Tilt Repeat these steps 5-10 times:  Lie on your back on a firm bed or the floor with your legs extended.  Bend your knees so they are pointing toward the ceiling and your feet are flat on the floor.  Tighten your lower abdominal muscles to press your lower back against the floor. This motion will tilt your pelvis so your tailbone points up toward the ceiling instead of pointing to your feet or the floor.  With gentle tension and even breathing, hold this position for 5-10 seconds. Cat-Cow Repeat these steps until your lower back becomes more flexible:  Get into a hands-and-knees position on a firm surface. Keep your hands under your shoulders, and keep your knees under your hips. You may place padding under your knees for comfort.  Let your head hang down, and point your tailbone toward the floor so your lower back becomes rounded like the  back of a cat.  Hold this position for 5 seconds.  Slowly lift your head and point your tailbone up toward the ceiling so your back forms a sagging arch like the back of a cow.  Hold this position for 5 seconds. Press-Ups Repeat these steps 5-10 times:  Lie on your abdomen (face-down) on the floor.  Place your palms near your head, about shoulder-width apart.  While you keep your back as relaxed as possible and keep your hips on the floor, slowly straighten your arms to raise the top half of your body and lift your shoulders. Do not use your back muscles to raise your upper torso. You may adjust the placement of your hands to make yourself more comfortable.  Hold this position for 5 seconds while you keep your back relaxed.  Slowly return to lying flat on the floor. Bridges Repeat these steps 10 times:  Lie on your back on a firm surface.  Bend your knees so they are pointing toward the ceiling and your feet are flat on the floor.  Tighten your buttocks muscles and lift your buttocks off of the floor until your waist is at almost the same height as your knees. You should feel the muscles working in your buttocks and the back of your thighs. If you do not feel these muscles, slide your feet 1-2 inches farther away from your buttocks.  Hold this position for 3-5  seconds.  Slowly lower your hips to the starting position, and allow your buttocks muscles to relax completely. If this exercise is too easy, try doing it with your arms crossed over your chest. Abdominal Crunches Repeat these steps 5-10 times:  Lie on your back on a firm bed or the floor with your legs extended.  Bend your knees so they are pointing toward the ceiling and your feet are flat on the floor.  Cross your arms over your chest.  Tip your chin slightly toward your chest without bending your neck.  Tighten your abdominal muscles and slowly raise your trunk (torso) high enough to lift your shoulder blades a  tiny bit off of the floor. Avoid raising your torso higher than that, because it can put too much stress on your low back and it does not help to strengthen your abdominal muscles.  Slowly return to your starting position. Back Lifts Repeat these steps 5-10 times:  Lie on your abdomen (face-down) with your arms at your sides, and rest your forehead on the floor.  Tighten the muscles in your legs and your buttocks.  Slowly lift your chest off of the floor while you keep your hips pressed to the floor. Keep the back of your head in line with the curve in your back. Your eyes should be looking at the floor.  Hold this position for 3-5 seconds.  Slowly return to your starting position. SEEK MEDICAL CARE IF:  Your back pain or discomfort gets much worse when you do an exercise.  Your back pain or discomfort does not lessen within 2 hours after you exercise. If you have any of these problems, stop doing these exercises right away. Do not do them again unless your health care provider says that you can. SEEK IMMEDIATE MEDICAL CARE IF:  You develop sudden, severe back pain. If this happens, stop doing the exercises right away. Do not do them again unless your health care provider says that you can.   This information is not intended to replace advice given to you by your health care provider. Make sure you discuss any questions you have with your health care provider.   Document Released: 10/05/2004 Document Revised: 05/19/2015 Document Reviewed: 10/22/2014 Elsevier Interactive Patient Education 2016 [employer] Sciatica Sciatica is pain, weakness, numbness, or tingling along the path of the sciatic nerve. The nerve starts in the lower back and runs down the back of each leg. The nerve controls the muscles in the lower leg and in the back of the knee, while also providing sensation to the back of the thigh, lower leg, and the sole of your foot. Sciatica is a symptom of another medical  condition. For instance, nerve damage or certain conditions, such as a herniated disk or bone spur on the spine, pinch or put pressure on the sciatic nerve. This causes the pain, weakness, or other sensations normally associated with sciatica. Generally, sciatica only affects one side of the body. CAUSES   Herniated or slipped disc.  Degenerative disk disease.  A pain disorder involving the narrow muscle in the buttocks (piriformis syndrome).  Pelvic injury or fracture.  Pregnancy.  Tumor (rare). SYMPTOMS  Symptoms can vary from mild to very severe. The symptoms usually travel from the low back to the buttocks and down the back of the leg. Symptoms can include:  Mild tingling or dull aches in the lower back, leg, or hip.  Numbness in the back of the calf or sole of the  foot.  Burning sensations in the lower back, leg, or hip.  Sharp pains in the lower back, leg, or hip.  Leg weakness.  Severe back pain inhibiting movement. These symptoms may get worse with coughing, sneezing, laughing, or prolonged sitting or standing. Also, being overweight may worsen symptoms. DIAGNOSIS  Your caregiver will perform a physical exam to look for common symptoms of sciatica. He or she may ask you to do certain movements or activities that would trigger sciatic nerve pain. Other tests may be performed to find the cause of the sciatica. These may include:  Blood tests.  X-rays.  Imaging tests, such as an MRI or CT scan. TREATMENT  Treatment is directed at the cause of the sciatic pain. Sometimes, treatment is not necessary and the pain and discomfort goes away on its own. If treatment is needed, your caregiver may suggest:  Over-the-counter medicines to relieve pain.  Prescription medicines, such as anti-inflammatory medicine, muscle relaxants, or narcotics.  Applying heat or ice to the painful area.  Steroid injections to lessen pain, irritation, and inflammation around the  nerve.  Reducing activity during periods of pain.  Exercising and stretching to strengthen your abdomen and improve flexibility of your spine. Your caregiver may suggest losing weight if the extra weight makes the back pain worse.  Physical therapy.  Surgery to eliminate what is pressing or pinching the nerve, such as a bone spur or part of a herniated disk. HOME CARE INSTRUCTIONS   Only take over-the-counter or prescription medicines for pain or discomfort as directed by your caregiver.  Apply ice to the affected area for 20 minutes, 3-4 times a day for the first 48-72 hours. Then try heat in the same way.  Exercise, stretch, or perform your usual activities if these do not aggravate your pain.  Attend physical therapy sessions as directed by your caregiver.  Keep all follow-up appointments as directed by your caregiver.  Do not wear high heels or shoes that do not provide proper support.  Check your mattress to see if it is too soft. A firm mattress may lessen your pain and discomfort. SEEK IMMEDIATE MEDICAL CARE IF:   You lose control of your bowel or bladder (incontinence).  You have increasing weakness in the lower back, pelvis, buttocks, or legs.  You have redness or swelling of your back.  You have a burning sensation when you urinate.  You have pain that gets worse when you lie down or awakens you at night.  Your pain is worse than you have experienced in the past.  Your pain is lasting longer than 4 weeks.  You are suddenly losing weight without reason. MAKE SURE YOU:  Understand these instructions.  Will watch your condition.  Will get help right away if you are not doing well or get worse.   This information is not intended to replace advice given to you by your health care provider. Make sure you discuss any questions you have with your health care provider.   Document Released: 08/22/2001 Document Revised: 05/19/2015 Document Reviewed:  01/07/2012 Elsevier Interactive Patient Education [employer].

## 2016-01-23 NOTE — ED Provider Notes (Signed)
CSN: [id number]     Arrival date & time 01/23/16  [id number] History   First MD Initiated Contact with Patient 01/23/16 [phone]     Chief Complaint  Patient presents with  . Leg Pain    Joshua Lewis is a 55 y.o. male who presents to the ED complaining of left leg pain for several months. He thinks he has some sciatica. He complains of pain that starts in his left low back just above his buttocks and extends down his posterior leg. He complains of 10 out of 10 pain currently. He reports his pain is worse with movement and with certain sleeping positions. He reports taking ibuprofen yesterday but no treatments today. He denies any injury or trauma to his leg or back. He denies fevers, numbness, tingling, weakness, loss of bladder control, loss of bowel control, abdominal pain, nausea, vomiting, diarrhea, urinary symptoms or rashes.   Patient is a 55 y.o. male presenting with leg pain. The history is provided by the patient. No language interpreter was used.  Leg Pain Associated symptoms: no fever and no neck pain     Past Medical History  Diagnosis Date  . Kidney carcinoma [facility])    Past Surgical History  Procedure Laterality Date  . Nephrectomy      right  . Hernia repair    . Hand surgery     Family History  Problem Relation Age of Onset  . Cancer Mother   . Diabetes Mother   . Hypertension Mother    Social History  Substance Use Topics  . Smoking status: Current Some Day Smoker -- 0.50 packs/day  . Smokeless tobacco: Never Used  . Alcohol Use: No    Review of Systems  Constitutional: Negative for fever.  Gastrointestinal: Negative for nausea, vomiting and abdominal pain.  Genitourinary: Negative for dysuria, frequency, hematuria and difficulty urinating.  Musculoskeletal: Positive for arthralgias. Negative for gait problem and neck pain.  Skin: Negative for rash and wound.  Neurological: Negative for weakness and numbness.      Allergies  Review of patient's allergies  indicates no known allergies.  Home Medications   Prior to Admission medications   Medication Sig Start Date End Date Taking? Authorizing Provider  cyclobenzaprine (FLEXERIL) 10 MG tablet Take 1 tablet (10 mg total) by mouth 2 (two) times daily as needed for muscle spasms. 07/04/15   Glendell Docker, NP  methocarbamol (ROBAXIN) 500 MG tablet Take 1 tablet (500 mg total) by mouth 2 (two) times daily as needed for muscle spasms. 01/23/16   Waynetta Pean, PA-C  naproxen (NAPROSYN) 250 MG tablet Take 1 tablet (250 mg total) by mouth 2 (two) times daily with a meal. 01/23/16   Waynetta Pean, PA-C  oxyCODONE-acetaminophen (PERCOCET/ROXICET) 5-325 MG per tablet Take 1 tablet by mouth every 6 (six) hours as needed for moderate pain or severe pain. 11/22/14   Evelina Bucy, MD  predniSONE (DELTASONE) 20 MG tablet Take 2 tablets (40 mg total) by mouth daily. Take 40 mg by mouth daily for 6 days, then 20mg  by mouth daily for 6 days, then 10mg  daily for 6 days 11/04/14   Antonietta Breach, PA-C  traMADol (ULTRAM) 50 MG tablet Take 1 tablet (50 mg total) by mouth every 6 (six) hours as needed. 07/04/15   Glendell Docker, NP   BP 138/78 mmHg  Pulse 79  Temp(Src) 97.6 F (36.4 C) (Oral)  Resp 18  Ht 5\' 9"  (1.753 m)  Wt 66.225 kg  BMI 21.55 kg/m2  SpO2  100% Physical Exam  Constitutional: He appears well-developed and well-nourished. No distress.  Nontoxic appearing.  HENT:  Head: Normocephalic and atraumatic.  Eyes: Right eye exhibits no discharge. Left eye exhibits no discharge.  Cardiovascular: Normal rate, regular rhythm and intact distal pulses.   Pulmonary/Chest: Effort normal. No respiratory distress.  Abdominal: Soft. There is no tenderness.  Musculoskeletal: Normal range of motion. He exhibits no edema or tenderness.  No midline neck or back tenderness. No back deformity, ecchymosis, erythema or warmth. Strength is 5 out of 5 in his bilateral lower extremities. No lower extremity edema or  tenderness.  Neurological: He is alert. He has normal reflexes. He displays normal reflexes. Coordination normal.  Sensation is intact in his bilateral lower extremities. Normal gait. Bilateral patellar DTRs are intact.  Skin: Skin is warm and dry. No rash noted. He is not diaphoretic. No erythema. No pallor.  Psychiatric: He has a normal mood and affect. His behavior is normal.  Nursing note and vitals reviewed.   ED Course  Procedures (including critical care time) Labs Review Labs Reviewed - No data to display  Imaging Review No results found.    EKG Interpretation None      Filed Vitals:   01/23/16 0835  BP: 138/78  Pulse: 79  Temp: 97.6 F (36.4 C)  TempSrc: Oral  Resp: 18  Height: 5\' 9"  (1.753 m)  Weight: 66.225 kg  SpO2: 100%     MDM   Meds given in ED:  Medications - No data to display  Discharge Medication List as of 01/23/2016  8:55 AM    START taking these medications   Details  methocarbamol (ROBAXIN) 500 MG tablet Take 1 tablet (500 mg total) by mouth 2 (two) times daily as needed for muscle spasms., Starting 01/23/2016, Until Discontinued, Print        Final diagnoses:  Sciatica of left side    This  is a 55 y.o. male who presents to the ED complaining of left leg pain for several months. He thinks he has some sciatica. He complains of pain that starts in his left low back just above his buttocks and extends down his posterior leg. He complains of 10 out of 10 pain currently. He reports his pain is worse with movement and with certain sleeping positions.  On exam the patient is afebrile nontoxic appearing. He has no focal neurological deficits. Normal gait. Back is nontender to palpation. Good strength to his bilateral lower legs. Patient with a left sciatica. We'll discharge with prescriptions for naproxen and Robaxin. I discussed exercises and encouraged him to keep his follow-up appointment with his primary care provider at the end of this month as  we discussed. I discussed return precautions. I advised the patient to follow-up with their primary care provider this week. I advised the patient to return to the emergency department with new or worsening symptoms or new concerns. The patient verbalized understanding and agreement with plan.      Waynetta Pean, PA-C 01/23/16 [id number]  Virgel Manifold, MD 01/23/16 [phone]

## 2016-01-30 ENCOUNTER — Emergency Department (HOSPITAL_COMMUNITY)
Admission: EM | Admit: 2016-01-30 | Discharge: 2016-01-30 | Disposition: A | Discharge status: Home / Self Care | Payer: Managed Care, Other (non HMO) | Attending: Emergency Medicine | Admitting: Emergency Medicine

## 2016-01-30 ENCOUNTER — Encounter (HOSPITAL_COMMUNITY): Payer: Self-pay

## 2016-01-30 DIAGNOSIS — M545 Low back pain: Secondary | ICD-10-CM | POA: Insufficient documentation

## 2016-01-30 DIAGNOSIS — F17200 Nicotine dependence, unspecified, uncomplicated: Secondary | ICD-10-CM | POA: Insufficient documentation

## 2016-01-30 DIAGNOSIS — Z85528 Personal history of other malignant neoplasm of kidney: Secondary | ICD-10-CM | POA: Insufficient documentation

## 2016-01-30 DIAGNOSIS — M79605 Pain in left leg: Secondary | ICD-10-CM | POA: Insufficient documentation

## 2016-01-30 DIAGNOSIS — Z905 Acquired absence of kidney: Secondary | ICD-10-CM | POA: Insufficient documentation

## 2016-01-30 DIAGNOSIS — Z79899 Other long term (current) drug therapy: Secondary | ICD-10-CM | POA: Insufficient documentation

## 2016-01-30 MED ORDER — PREDNISONE 20 MG PO TABS
40.0000 mg | ORAL_TABLET | Freq: Once | ORAL | Status: AC
  Administered 2016-01-30: 40 mg via ORAL
  Filled 2016-01-30: qty 2

## 2016-01-30 MED ORDER — PREDNISONE 20 MG PO TABS: ORAL_TABLET | ORAL | Status: DC

## 2016-01-30 NOTE — ED Notes (Signed)
C/o left hip/leg pain x "months". States he looked his sx up on line and thinks "it might be sciatica".

## 2016-01-30 NOTE — ED Provider Notes (Signed)
CSN: [id number]     Arrival date & time 01/30/16  0810 History  By signing my name below, I, Randa Evens, attest that this documentation has been prepared under the direction and in the presence of Larene Pickett, PA-C. Electronically Signed: Randa Evens, ED Scribe. 01/30/2016. 9:53 AM.     Chief Complaint  Patient presents with  . Leg Pain   The history is provided by the patient. No language interpreter was used.   HPI Comments: Joshua Lewis is a 55 y.o. male who presents to the Emergency Department complaining of low left sided back pain onset 2 months prior. Pt states that the pain is constant and radiates down his left leg. Pt states that he has tried hydrocodone, naproxen, tramadol and robaxin with no relief. Pt states that he has tried taking baths as well with no relief. Pt reports some relief when laying on his right side. Pt states that the pain is worse when ambulating and sitting down. He states that he looked up his symptoms online and feels as if he has sciatica. Pt doesn't report numbness, weakness, bowel/bladder incontinence or other related symptoms.  Past Medical History  Diagnosis Date  . Kidney carcinoma [facility])    Past Surgical History  Procedure Laterality Date  . Nephrectomy      right  . Hernia repair    . Hand surgery     Family History  Problem Relation Age of Onset  . Cancer Mother   . Diabetes Mother   . Hypertension Mother    Social History  Substance Use Topics  . Smoking status: Current Some Day Smoker -- 0.50 packs/day  . Smokeless tobacco: Never Used  . Alcohol Use: No    Review of Systems  Musculoskeletal: Positive for back pain.  All other systems reviewed and are negative.     Allergies  Review of patient's allergies indicates no known allergies.  Home Medications   Prior to Admission medications   Medication Sig Start Date End Date Taking? Authorizing Provider  cyclobenzaprine (FLEXERIL) 10 MG tablet Take 1 tablet (10 mg  total) by mouth 2 (two) times daily as needed for muscle spasms. 07/04/15   Glendell Docker, NP  methocarbamol (ROBAXIN) 500 MG tablet Take 1 tablet (500 mg total) by mouth 2 (two) times daily as needed for muscle spasms. 01/23/16   Waynetta Pean, PA-C  naproxen (NAPROSYN) 250 MG tablet Take 1 tablet (250 mg total) by mouth 2 (two) times daily with a meal. 01/23/16   Waynetta Pean, PA-C  oxyCODONE-acetaminophen (PERCOCET/ROXICET) 5-325 MG per tablet Take 1 tablet by mouth every 6 (six) hours as needed for moderate pain or severe pain. 11/22/14   Evelina Bucy, MD  predniSONE (DELTASONE) 20 MG tablet Take 2 tablets (40 mg total) by mouth daily. Take 40 mg by mouth daily for 6 days, then 20mg  by mouth daily for 6 days, then 10mg  daily for 6 days 11/04/14   Antonietta Breach, PA-C  traMADol (ULTRAM) 50 MG tablet Take 1 tablet (50 mg total) by mouth every 6 (six) hours as needed. 07/04/15   Glendell Docker, NP   BP 132/99 mmHg  Pulse 90  Temp(Src) 97.6 F (36.4 C) (Oral)  Resp 18  SpO2 98%   Physical Exam  Constitutional: He is oriented to person, place, and time. He appears well-developed and well-nourished. No distress.  HENT:  Head: Normocephalic and atraumatic.  Mouth/Throat: Oropharynx is clear and moist.  Eyes: Conjunctivae and EOM are normal. Pupils are equal,  round, and reactive to light.  Neck: Normal range of motion. Neck supple.  Cardiovascular: Normal rate, regular rhythm and normal heart sounds.   Pulmonary/Chest: Effort normal and breath sounds normal. No respiratory distress. He has no wheezes.  Musculoskeletal: Normal range of motion.  Endorses pain of left lower back with radiation down posterior left leg, no focal tenderness or deformity noted on exam, normal strength and sensation of bilateral lower extremities, gait assisted with cane which is baseline  Neurological: He is alert and oriented to person, place, and time.  Skin: Skin is warm and dry. He is not diaphoretic.   Psychiatric: He has a normal mood and affect.  Nursing note and vitals reviewed.   ED Course  Procedures (including critical care time) DIAGNOSTIC STUDIES: Oxygen Saturation is 98% on RA, normal by my interpretation.    COORDINATION OF CARE: 9:53 AM-Discussed treatment plan with pt at bedside and pt agreed to plan.    Labs Review Labs Reviewed - No data to display  Imaging Review No results found.    EKG Interpretation None      MDM   Final diagnoses:  Left leg pain   55 year old male here with left leg pain. This appears to be a chronic issue, he has been seen in the ED for this multiple times.  Endorses pain of left lower back with radiation down left leg. No red flag symptoms or neurologic deficits noted today to suggest cauda equina. 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 Will start on prednisone taper to take with his other medications. He has follow-up appointment with his PCP scheduled for later this month.  Discussed plan with patient, he/she acknowledged understanding and agreed with plan of care.  Return precautions given for new or worsening symptoms.  I personally performed the services described in this documentation, which was scribed in my presence. The recorded information has been reviewed and is accurate.  Larene Pickett, PA-C 01/30/16 1021  Carmin Muskrat, MD 01/30/16 1052

## 2016-01-30 NOTE — ED Notes (Addendum)
Patient here with request for further evaluation of left hip/leg pain. Has been taking the anti-inflammatories with minimal relief. Denies trauma. Reports that he was told he has sciatica

## 2016-01-30 NOTE — Discharge Instructions (Signed)
Take the prescribed medication as directed.  You may continue taking your other pain medications with this. Follow-up with your primary care doctor. Return to the ED for new or worsening symptoms.

## 2016-03-11 ENCOUNTER — Telehealth (HOSPITAL_COMMUNITY): Payer: Self-pay

## 2016-03-11 ENCOUNTER — Encounter (HOSPITAL_COMMUNITY): Payer: Self-pay | Admitting: *Deleted

## 2016-03-11 ENCOUNTER — Emergency Department (HOSPITAL_COMMUNITY)
Admission: EM | Admit: 2016-03-11 | Discharge: 2016-03-11 | Disposition: A | Discharge status: Home / Self Care | Payer: Self-pay | Attending: Emergency Medicine | Admitting: Emergency Medicine

## 2016-03-11 ENCOUNTER — Emergency Department (HOSPITAL_COMMUNITY): Payer: Self-pay

## 2016-03-11 DIAGNOSIS — Z79899 Other long term (current) drug therapy: Secondary | ICD-10-CM | POA: Insufficient documentation

## 2016-03-11 DIAGNOSIS — F17200 Nicotine dependence, unspecified, uncomplicated: Secondary | ICD-10-CM | POA: Insufficient documentation

## 2016-03-11 DIAGNOSIS — R531 Weakness: Secondary | ICD-10-CM | POA: Insufficient documentation

## 2016-03-11 DIAGNOSIS — Z85528 Personal history of other malignant neoplasm of kidney: Secondary | ICD-10-CM | POA: Insufficient documentation

## 2016-03-11 DIAGNOSIS — R29898 Other symptoms and signs involving the musculoskeletal system: Secondary | ICD-10-CM

## 2016-03-11 DIAGNOSIS — M545 Low back pain: Secondary | ICD-10-CM | POA: Insufficient documentation

## 2016-03-11 DIAGNOSIS — M549 Dorsalgia, unspecified: Secondary | ICD-10-CM

## 2016-03-11 LAB — CBC
HCT: 42.8 % (ref 39.0–52.0)
Hemoglobin: 14.2 g/dL (ref 13.0–17.0)
MCH: 29.8 pg (ref 26.0–34.0)
MCHC: 33.2 g/dL (ref 30.0–36.0)
MCV: 89.7 fL (ref 78.0–100.0)
Platelets: 230 10*3/uL (ref 150–400)
RBC: 4.77 MIL/uL (ref 4.22–5.81)
RDW: 13.4 % (ref 11.5–15.5)
WBC: 10.4 10*3/uL (ref 4.0–10.5)

## 2016-03-11 LAB — COMPREHENSIVE METABOLIC PANEL
ALT: 15 U/L — ABNORMAL LOW (ref 17–63)
AST: 19 U/L (ref 15–41)
Albumin: 3.8 g/dL (ref 3.5–5.0)
Alkaline Phosphatase: 69 U/L (ref 38–126)
Anion gap: 6 (ref 5–15)
BUN: 14 mg/dL (ref 6–20)
CO2: 26 mmol/L (ref 22–32)
Calcium: 9.5 mg/dL (ref 8.9–10.3)
Chloride: 108 mmol/L (ref 101–111)
Creatinine, Ser: 1.21 mg/dL (ref 0.61–1.24)
GFR calc Af Amer: >60 mL/min (ref >60)
GFR calc non Af Amer: >60 mL/min (ref >60)
Glucose, Bld: 106 mg/dL — ABNORMAL HIGH (ref 65–99)
Potassium: 3.5 mmol/L (ref 3.5–5.1)
Sodium: 140 mmol/L (ref 135–145)
Total Bilirubin: 0.9 mg/dL (ref 0.3–1.2)
Total Protein: 6.8 g/dL (ref 6.5–8.1)

## 2016-03-11 LAB — URINALYSIS, ROUTINE W REFLEX MICROSCOPIC
Bilirubin Urine: NEGATIVE
Glucose, UA: NEGATIVE mg/dL
Hgb urine dipstick: NEGATIVE
Ketones, ur: NEGATIVE mg/dL
Leukocytes, UA: NEGATIVE
Nitrite: NEGATIVE
Protein, ur: NEGATIVE mg/dL
Specific Gravity, Urine: 1.026 (ref 1.005–1.030)
pH: 6 (ref 5.0–8.0)

## 2016-03-11 LAB — LIPASE, BLOOD: Lipase: 39 U/L (ref 11–51)

## 2016-03-11 MED ORDER — HYDROMORPHONE HCL 1 MG/ML IJ SOLN
1.0000 mg | Freq: Once | INTRAMUSCULAR | Status: AC
  Administered 2016-03-11: 1 mg via INTRAVENOUS
  Filled 2016-03-11: qty 1

## 2016-03-11 MED ORDER — PREDNISONE 20 MG PO TABS
60.0000 mg | ORAL_TABLET | Freq: Once | ORAL | Status: AC
  Administered 2016-03-11: 60 mg via ORAL
  Filled 2016-03-11: qty 3

## 2016-03-11 MED ORDER — ORPHENADRINE CITRATE ER 100 MG PO TB12: 100.0000 mg | ORAL_TABLET | Freq: Two times a day (BID) | ORAL | Status: DC

## 2016-03-11 MED ORDER — KETOROLAC TROMETHAMINE 30 MG/ML IJ SOLN
30.0000 mg | Freq: Once | INTRAMUSCULAR | Status: AC
  Administered 2016-03-11: 30 mg via INTRAVENOUS
  Filled 2016-03-11: qty 1

## 2016-03-11 MED ORDER — CYCLOBENZAPRINE HCL 10 MG PO TABS
5.0000 mg | ORAL_TABLET | Freq: Once | ORAL | Status: AC
  Administered 2016-03-11: 5 mg via ORAL
  Filled 2016-03-11: qty 1

## 2016-03-11 MED ORDER — TRAMADOL HCL 50 MG PO TABS
100.0000 mg | ORAL_TABLET | Freq: Once | ORAL | Status: AC
  Administered 2016-03-11: 100 mg via ORAL
  Filled 2016-03-11: qty 2

## 2016-03-11 MED ORDER — METHYLPREDNISOLONE 4 MG PO TBPK
ORAL_TABLET | ORAL | Status: DC
Start: 2016-03-11 — End: 2016-08-08

## 2016-03-11 MED ORDER — TRAMADOL HCL 50 MG PO TABS: 100.0000 mg | ORAL_TABLET | Freq: Four times a day (QID) | ORAL | Status: DC | PRN

## 2016-03-11 NOTE — Telephone Encounter (Signed)
Pt calling for UA results informed results back prior to DC and urine didn't show signs of UTI

## 2016-03-11 NOTE — ED Provider Notes (Addendum)
CSN: [id number]     Arrival date & time 03/11/16  1337 History   First MD Initiated Contact with Patient 03/11/16 1534     Chief Complaint  Patient presents with  . Abdominal Pain  . Hip Pain     (Consider location/radiation/quality/duration/timing/severity/associated sxs/prior Treatment) HPI Patient has had ongoing problems with pain in his left lower back for several months. He reports it is chronic and persistent. He states is getting worse and worse. He states over the past several weeks as gotten the point that he is falling down several times a day because his leg gives out. He reports the pain radiates down the side of his leg all the way to his foot. He denies incontinence. He reports sometimes his urinary stream is somewhat slow but no pain burning or urgency. Occasional diarrheal stool. He reports he had an MRI about 3 years ago when he had a problem with disc but did not need surgical treatment. Past Medical History  Diagnosis Date  . Kidney carcinoma [facility])    Past Surgical History  Procedure Laterality Date  . Nephrectomy      right  . Hernia repair    . Hand surgery     Family History  Problem Relation Age of Onset  . Cancer Mother   . Diabetes Mother   . Hypertension Mother    Social History  Substance Use Topics  . Smoking status: Current Some Day Smoker -- 0.50 packs/day  . Smokeless tobacco: Never Used  . Alcohol Use: No    Review of Systems 10 Systems reviewed and are negative for acute change except as noted in the HPI.    Allergies  Review of patient's allergies indicates no known allergies.  Home Medications   Prior to Admission medications   Medication Sig Start Date End Date Taking? Authorizing Provider  cyclobenzaprine (FLEXERIL) 10 MG tablet Take 1 tablet (10 mg total) by mouth 2 (two) times daily as needed for muscle spasms. 07/04/15   Glendell Docker, NP  methocarbamol (ROBAXIN) 500 MG tablet Take 1 tablet (500 mg total) by mouth 2 (two)  times daily as needed for muscle spasms. 01/23/16   Waynetta Pean, PA-C  methylPREDNISolone (MEDROL DOSEPAK) 4 MG TBPK tablet Take according a dosepak instructions 03/11/16   Charlesetta Shanks, MD  naproxen (NAPROSYN) 250 MG tablet Take 1 tablet (250 mg total) by mouth 2 (two) times daily with a meal. 01/23/16   Waynetta Pean, PA-C  orphenadrine (NORFLEX) 100 MG tablet Take 1 tablet (100 mg total) by mouth 2 (two) times daily. 03/11/16   Charlesetta Shanks, MD  oxyCODONE-acetaminophen (PERCOCET/ROXICET) 5-325 MG per tablet Take 1 tablet by mouth every 6 (six) hours as needed for moderate pain or severe pain. 11/22/14   Evelina Bucy, MD  predniSONE (DELTASONE) 20 MG tablet Take 40 mg by mouth daily for 3 days, then 20mg  by mouth daily for 3 days, then 10mg  daily for 3 days 01/30/16   Larene Pickett, PA-C  traMADol (ULTRAM) 50 MG tablet Take 1 tablet (50 mg total) by mouth every 6 (six) hours as needed. 07/04/15   Glendell Docker, NP  traMADol (ULTRAM) 50 MG tablet Take 2 tablets (100 mg total) by mouth every 6 (six) hours as needed. 03/11/16   Charlesetta Shanks, MD   BP 127/85 mmHg  Pulse 77  Temp(Src) 98.7 F (37.1 C) (Oral)  Resp 16  SpO2 100% Physical Exam  Constitutional: He is oriented to person, place, and time. He appears well-developed  and well-nourished.  HENT:  Head: Normocephalic and atraumatic.  Eyes: EOM are normal. Pupils are equal, round, and reactive to light.  Neck: Neck supple.  Cardiovascular: Normal rate, regular rhythm, normal heart sounds and intact distal pulses.   Pulmonary/Chest: Effort normal and breath sounds normal.  Abdominal: Soft. Bowel sounds are normal. He exhibits no distension. There is no tenderness.  Musculoskeletal: Normal range of motion. He exhibits no edema.  No reproducible pain to palpation over the bony prominences of the back or the pelvis. Significant pain with straight leg raise of the left lower extremity. Patient has pain limited ability to raise the left lower  extremity and hold it off the bed. He has good and normal strength in the right lower extremity. She does have intact flexion and extension strength of the foot of the left lower extremity. The station intact to light touch.  Neurological: He is alert and oriented to person, place, and time. He has normal strength. No cranial nerve deficit. He exhibits normal muscle tone. Coordination normal. GCS eye subscore is 4. GCS verbal subscore is 5. GCS motor subscore is 6.  Skin: Skin is warm, dry and intact.  Psychiatric: He has a normal mood and affect.    ED Course  Procedures (including critical care time) Labs Review Labs Reviewed  COMPREHENSIVE METABOLIC PANEL - Abnormal; Notable for the following:    Glucose, Bld 106 (*)    ALT 15 (*)    All other components within normal limits  LIPASE, BLOOD  CBC  URINALYSIS, ROUTINE W REFLEX MICROSCOPIC (NOT AT [facility])    Imaging Review Mr Lumbar Spine Wo Contrast  03/11/2016  CLINICAL DATA:  55 year old male with history of renal cell carcinoma presenting with numbness and tingling left hip. Initial encounter. EXAM: MRI LUMBAR SPINE WITHOUT CONTRAST TECHNIQUE: Multiplanar, multisequence MR imaging of the lumbar spine was performed. No intravenous contrast was administered. COMPARISON:  11/14/2013 plain film examination. Unfortunately the patient's 12/26/2013 exam is not available for direct review. There is a report available. FINDINGS: Examination is motion degraded. Patient moved during exam and had difficulty lying supine. Segmentation: Last fully open disk space is labeled L5-S1. Present examination incorporates from T11-12 disc space through the lower sacrum. Alignment:  Mild scoliosis convex to the right. Vertebrae: Hemangioma L2 vertebra. Scattered focal fatty deposits. No worrisome osseous abnormality noted on STIR imaging. Conus medullaris: Extends to the upper L1 level and appears normal. Paraspinal and other soft tissues: Post right nephrectomy. 2.1 x  1.5 x 4.8 cm abnormal soft tissue process located between the L3 and L4 vertebral body and the left psoas muscle. With the patient's history of renal cell carcinoma, this is suspicious for metastatic disease. CT of the abdomen and pelvis recommended for further delineation (with contrast the patient can tolerate such). Disc levels: T11-12 through L2-3 unremarkable. L3-4: Minimal to mild bulge. Mild facet degenerative changes. Left L3 nerve root is adjacent to suspected metastatic disease as discussed above. L4-5: Bulge. Facet degenerative changes. Mild ligamentum flavum hypertrophy. Epidural fat. No significant thecal sac compromise or foraminal narrowing. L5-S1: Question prior left laminectomy. Disc degeneration with bulge with small broad-based left paracentral protrusion with mass effect upon the upper left S1 nerve root and minimal flattening left aspect of the thecal sac. Right lateral extension of disc osteophyte contacts the exiting right L5 nerve root. Facet degenerative changes. IMPRESSION: Patient moved during exam and had difficulty lying supine. Post right nephrectomy. 2.1 x 1.5 x 4.8 cm abnormal soft tissue process  located between the L3 and L4 vertebral body and the left psoas muscle. With the patient's history of renal cell carcinoma, this is suspicious for metastatic disease. CT of the abdomen and pelvis recommended for further delineation (with contrast the patient can tolerate such). L5-S1 bulge with small broad-based left paracentral protrusion with mass effect upon the upper left S1 nerve root and minimal flattening left aspect of the thecal sac. Question prior left laminectomy. Right lateral extension of disc osteophyte contacts the exiting right L5 nerve root. Facet degenerative changes. L4-5 bulge. Facet degenerative changes. No significant thecal sac compromise or foraminal narrowing. L3-4 minimal to mild bulge. Mild facet degenerative changes. Left L3 nerve root is adjacent to suspected  metastatic disease as discussed above. Electronically Signed   By: Genia Del M.D.   On: 03/11/2016 17:44   I have personally reviewed and evaluated these images and lab results as part of my medical decision-making.   EKG Interpretation None      MDM   Final diagnoses:  Intractable back pain  Weakness of left lower extremity   Patient has had incrementally worsening lower back pain. The symptoms and physical findings are consistent with sciatica. He however notes significant worsening over the past couple weeks with the leg now giving out at times. It appears that this is significantly due to pain. Patient reports he can stand at work for a certain period of time but then ultimately his leg will give out on him. MRI obtained to define any critical disc impingement. Patient is counseled on the follow-up plan. With patient's increasing severity of pain and dysfunction, I advised the patient for neurosurgery follow-up. Referral numbers provided for Dr. Saintclair Halsted who is on call for neurosurgery. Patient is also advised to continue working with his primary care provider regarding ongoing coordination of care and referrals and pain management. Patient will be placed on a Solu-Medrol Dosepak, tramadol and Norflex for pain control.  03/12/2016 10:59 Patient contacted at home number and advised of MRI result regarding need for follow-up of soft tissue mass at prior nephrectomy site. Patient counseled this does not definitively represent cancer but requires ongoing testing and surveillance to make definitive diagnosis. He is instructed on how to find a family physician using his referral number in discharge instructions and reinforced of the necessity of follow-up with Dr. Saintclair Halsted.  Charlesetta Shanks, MD 03/12/16 [place], MD 03/12/16 1101

## 2016-03-11 NOTE — ED Notes (Signed)
Patient transported to MRI 

## 2016-03-11 NOTE — ED Notes (Signed)
Pt reports having pain for months. Having LLQ pain, left hip pain and it radiates down left leg. Pt reports pain possibly being sciatica but pt is having nausea, occ diff urinating and occ diarrhea.

## 2016-03-11 NOTE — Discharge Instructions (Signed)
Radicular Pain °Radicular pain in either the arm or leg is usually from a bulging or herniated disk in the spine. A piece of the herniated disk may press against the nerves as the nerves exit the spine. This causes pain which is felt at the tips of the nerves down the arm or leg. Other causes of radicular pain may include: °· Fractures. °· Heart disease. °· Cancer. °· An abnormal and usually degenerative state of the nervous system or nerves (neuropathy). °Diagnosis may require CT or MRI scanning to determine the primary cause.  °Nerves that start at the neck (nerve roots) may cause radicular pain in the outer shoulder and arm. It can spread down to the thumb and fingers. The symptoms vary depending on which nerve root has been affected. In most cases radicular pain improves with conservative treatment. Neck problems may require physical therapy, a neck collar, or cervical traction. Treatment may take many weeks, and surgery may be considered if the symptoms do not improve.  °Conservative treatment is also recommended for sciatica. Sciatica causes pain to radiate from the lower back or buttock area down the leg into the foot. Often there is a history of back problems. Most patients with sciatica are better after 2 to 4 weeks of rest and other supportive care. Short term bed rest can reduce the disk pressure considerably. Sitting, however, is not a good position since this increases the pressure on the disk. You should avoid bending, lifting, and all other activities which make the problem worse. Traction can be used in severe cases. Surgery is usually reserved for patients who do not improve within the first months of treatment. °Only take over-the-counter or prescription medicines for pain, discomfort, or fever as directed by your caregiver. Narcotics and muscle relaxants may help by relieving more severe pain and spasm and by providing mild sedation. Cold or massage can give significant relief. Spinal manipulation  is not recommended. It can increase the degree of disc protrusion. Epidural steroid injections are often effective treatment for radicular pain. These injections deliver medicine to the spinal nerve in the space between the protective covering of the spinal cord and back bones (vertebrae). Your caregiver can give you more information about steroid injections. These injections are most effective when given within two weeks of the onset of pain.  °You should see your caregiver for follow up care as recommended. A program for neck and back injury rehabilitation with stretching and strengthening exercises is an important part of management.  °SEEK IMMEDIATE MEDICAL CARE IF: °· You develop increased pain, weakness, or numbness in your arm or leg. °· You develop difficulty with bladder or bowel control. °· You develop abdominal pain. °  °This information is not intended to replace advice given to you by your health care provider. Make sure you discuss any questions you have with your health care provider. °  °Document Released: 10/05/2004 Document Revised: 09/18/2014 Document Reviewed: 03/24/2015 °Elsevier Interactive Patient Education ©2016 [employer] ° °

## 2016-04-19 ENCOUNTER — Inpatient Hospital Stay: Payer: Managed Care, Other (non HMO) | Admitting: Family Medicine

## 2016-08-08 ENCOUNTER — Emergency Department (HOSPITAL_COMMUNITY)
Admission: EM | Admit: 2016-08-08 | Discharge: 2016-08-08 | Disposition: A | Discharge status: Home / Self Care | Payer: 59 | Attending: Emergency Medicine | Admitting: Emergency Medicine

## 2016-08-08 ENCOUNTER — Encounter (HOSPITAL_COMMUNITY): Payer: Self-pay

## 2016-08-08 DIAGNOSIS — Y939 Activity, unspecified: Secondary | ICD-10-CM | POA: Insufficient documentation

## 2016-08-08 DIAGNOSIS — Y929 Unspecified place or not applicable: Secondary | ICD-10-CM | POA: Insufficient documentation

## 2016-08-08 DIAGNOSIS — X500XXA Overexertion from strenuous movement or load, initial encounter: Secondary | ICD-10-CM | POA: Diagnosis not present

## 2016-08-08 DIAGNOSIS — G8929 Other chronic pain: Secondary | ICD-10-CM | POA: Diagnosis not present

## 2016-08-08 DIAGNOSIS — F17200 Nicotine dependence, unspecified, uncomplicated: Secondary | ICD-10-CM | POA: Diagnosis not present

## 2016-08-08 DIAGNOSIS — M545 Low back pain, unspecified: Secondary | ICD-10-CM

## 2016-08-08 DIAGNOSIS — Y990 Civilian activity done for income or pay: Secondary | ICD-10-CM | POA: Insufficient documentation

## 2016-08-08 MED ORDER — OXYCODONE-ACETAMINOPHEN 5-325 MG PO TABS: 2.0000 | ORAL_TABLET | ORAL | 0 refills | Status: DC | PRN

## 2016-08-08 MED ORDER — PREDNISONE 10 MG PO TABS: ORAL_TABLET | ORAL | 0 refills | Status: DC

## 2016-08-08 MED ORDER — METHOCARBAMOL 500 MG PO TABS: 500.0000 mg | ORAL_TABLET | Freq: Two times a day (BID) | ORAL | 0 refills | Status: DC | PRN

## 2016-08-08 MED ORDER — METHYLPREDNISOLONE SODIUM SUCC 125 MG IJ SOLR
125.0000 mg | Freq: Once | INTRAMUSCULAR | Status: AC
  Administered 2016-08-08: 125 mg via INTRAMUSCULAR
  Filled 2016-08-08: qty 2

## 2016-08-08 MED ORDER — KETOROLAC TROMETHAMINE 60 MG/2ML IM SOLN
60.0000 mg | Freq: Once | INTRAMUSCULAR | Status: AC
  Administered 2016-08-08: 60 mg via INTRAMUSCULAR
  Filled 2016-08-08: qty 2

## 2016-08-08 NOTE — ED Notes (Signed)
C/o back pain while at work x last 2 days. States today, "went down to his knees" due to pain. States had recent back surgery and has appointment with Dr. Diamantina Monks Dec. 6th. Pt ambulatory to ED.

## 2016-08-08 NOTE — ED Provider Notes (Signed)
[facility] DEPT Provider Note   CSN: [id number] Arrival date & time: 08/08/16  [id number]  By signing my name below, I, Rayna Sexton, attest that this documentation has been prepared under the direction and in the presence of Caryl Ada, [place]. Electronically Signed: Rayna Sexton, ED Scribe. 08/08/16. 9:40 AM.   History   Chief Complaint Chief Complaint  Patient presents with  . Back Pain    HPI HPI Comments: Joshua Lewis is a 55 y.o. male who presents to the Emergency Department complaining of worsening, moderate, left lower back pain x 6 days. Pt states he had a laminectomy performed on his lumbar spine 8 weeks ago and following his surgery was supposed to be on work restriction. 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 He was performing heavy lifting today and felt a pop and a sudden worsening of his pain. He has taken 800 mg ibuprofen, tramadol and 5 mg percocet w/o significant relief. His last dose of percocet and ibuprofen was ~3 hours ago. Pt states his pain is focused in his left lower back and is a burning/tingling pain. His pain is consistent with his back pain prior to his surgery. His pain worsens with lying on the left side, palpating the region or with movement. Pt reports associated numbness and tingling when sitting for extended periods of time. He has a f/u appointment with his surgeon on 08/16/2016 which was moved from 09/06/2016. Pt states his last f/u with oncology for his kidney cancer was ~3 months ago. He denies bowel or bladder incontinence, numbness, tingling, or any other associated symptoms at this time.   The history is provided by the patient. No language interpreter was used.    Past Medical History:  Diagnosis Date  . Kidney carcinoma ([place])     There are no active problems to display for this patient.   Past Surgical History:  Procedure Laterality Date  . HAND SURGERY    .  HERNIA REPAIR    . NEPHRECTOMY     right     Home Medications    Prior to Admission medications   Medication Sig Start Date End Date Taking? Authorizing Provider  cyclobenzaprine (FLEXERIL) 10 MG tablet Take 1 tablet (10 mg total) by mouth 2 (two) times daily as needed for muscle spasms. 07/04/15   Glendell Docker, NP  methocarbamol (ROBAXIN) 500 MG tablet Take 1 tablet (500 mg total) by mouth 2 (two) times daily as needed for muscle spasms. 01/23/16   Waynetta Pean, PA-C  methylPREDNISolone (MEDROL DOSEPAK) 4 MG TBPK tablet Take according a dosepak instructions 03/11/16   Charlesetta Shanks, MD  naproxen (NAPROSYN) 250 MG tablet Take 1 tablet (250 mg total) by mouth 2 (two) times daily with a meal. 01/23/16   Waynetta Pean, PA-C  orphenadrine (NORFLEX) 100 MG tablet Take 1 tablet (100 mg total) by mouth 2 (two) times daily. 03/11/16   Charlesetta Shanks, MD  oxyCODONE-acetaminophen (PERCOCET/ROXICET) 5-325 MG per tablet Take 1 tablet by mouth every 6 (six) hours as needed for moderate pain or severe pain. 11/22/14   Evelina Bucy, MD  predniSONE (DELTASONE) 20 MG tablet Take 40 mg by mouth daily for 3 days, then 20mg  by mouth daily for 3 days, then 10mg  daily for 3 days 01/30/16   Larene Pickett, PA-C  traMADol (ULTRAM) 50 MG tablet Take 1 tablet (50 mg total) by mouth every 6 (six) hours as needed. 07/04/15  Glendell Docker, NP  traMADol (ULTRAM) 50 MG tablet Take 2 tablets (100 mg total) by mouth every 6 (six) hours as needed. 03/11/16   Charlesetta Shanks, MD    Family History Family History  Problem Relation Age of Onset  . Cancer Mother   . Diabetes Mother   . Hypertension Mother     Social History Social History  Substance Use Topics  . Smoking status: Current Some Day Smoker    Packs/day: 0.50  . Smokeless tobacco: Never Used  . Alcohol use No     Allergies   Patient has no known allergies.   Review of Systems Review of Systems  Gastrointestinal:       Negative bowel incontinence    Genitourinary:       Negative bladder incontinence   Musculoskeletal: Positive for back pain and myalgias.  Skin: Positive for wound.  Neurological: Negative for numbness.  All other systems reviewed and are negative.  Physical Exam Updated Vital Signs BP 139/80   Pulse 84   Temp 97.8 F (36.6 C) (Oral)   Resp 18   SpO2 99%   Physical Exam  Constitutional: He is oriented to person, place, and time. He appears well-developed and well-nourished.  HENT:  Head: Normocephalic and atraumatic.  Eyes: EOM are normal.  Neck: Normal range of motion.  Cardiovascular: Normal rate.   Pulmonary/Chest: Effort normal. No respiratory distress.  Abdominal: Soft.  Musculoskeletal: Normal range of motion. He exhibits tenderness.  Neurological: He is alert and oriented to person, place, and time.  Skin: Skin is warm and dry.  Well healed laminectomy scar   Psychiatric: He has a normal mood and affect.  Nursing note and vitals reviewed.  ED Treatments / Results  Labs (all labs ordered are listed, but only abnormal results are displayed) Labs Reviewed - No data to display  EKG  EKG Interpretation None       Radiology No results found.  Procedures Procedures  DIAGNOSTIC STUDIES: Oxygen Saturation is 99% on RA, normal by my interpretation.    COORDINATION OF CARE: 9:39 AM Discussed next steps with pt. Pt verbalized understanding and is agreeable with the plan.    Medications Ordered in ED Medications - No data to display   Initial Impression / Assessment and Plan / ED Course  I have reviewed the triage vital signs and the nursing notes.  Pertinent labs & imaging results that were available during my care of the patient were reviewed by me and considered in my medical decision making (see chart for details).  Clinical Course     Pt has appointment to see Dr. Gaston Islam on Dec 6. I will given torodol and solumedrol IM Final Clinical Impressions(s) / ED Diagnoses   Final  diagnoses:  Chronic low back pain without sciatica, unspecified back pain laterality    New Prescriptions New Prescriptions   OXYCODONE-ACETAMINOPHEN (PERCOCET/ROXICET) 5-325 MG TABLET    Take 2 tablets by mouth every 4 (four) hours as needed for severe pain.   PREDNISONE (DELTASONE) 10 MG TABLET    6,5,4,3,2,1 taper  An After Visit Summary was printed and given to the patient.   Hollace Kinnier [place], PA-C 08/08/16 [place], MD 08/08/16 [phone]

## 2016-08-08 NOTE — ED Triage Notes (Signed)
Patient complains of lower back pain after lifting at work x 2 days. States that he recently had back surgery and thinks he has re-injured

## 2016-08-27 IMAGING — MR MR CERVICAL SPINE W/O CM
4 of 5 series · 18 of 48 positions shown · non-contrast
Comparison: Prior CT from earlier the same day.

CLINICAL DATA: Initial evaluation for recent neck injury. Also with
pain radiating into upper extremities with intermittent numbness,
tingling in left arm.

EXAM:
MRI CERVICAL SPINE WITHOUT CONTRAST
TECHNIQUE: Multiplanar, multisequence MR imaging of the cervical spine was
performed. No intravenous contrast was administered.

[Series 3: T2 · sagittal · 3.0mm · 0.39mm/px · 6 of 14 slices shown (1 of 2)]
[im 1/14]
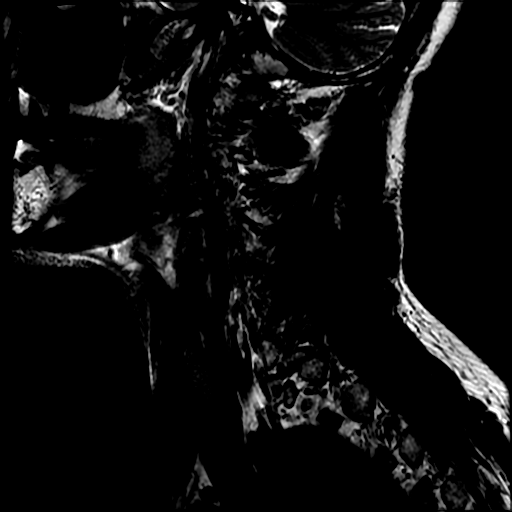
[im 3/14]
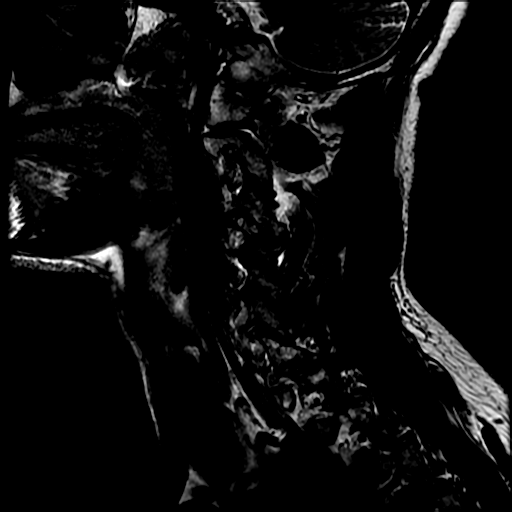
[im 6/14]
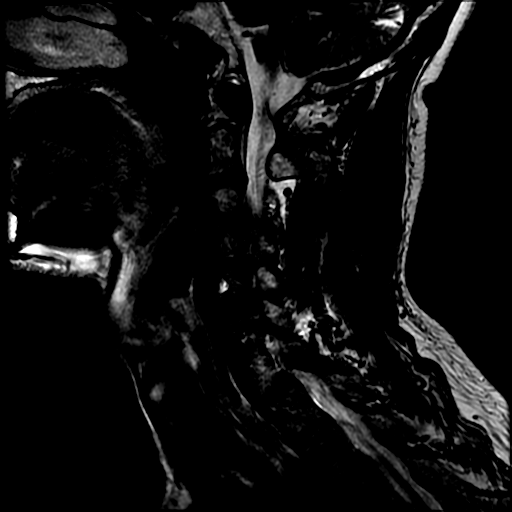
[im 8/14]
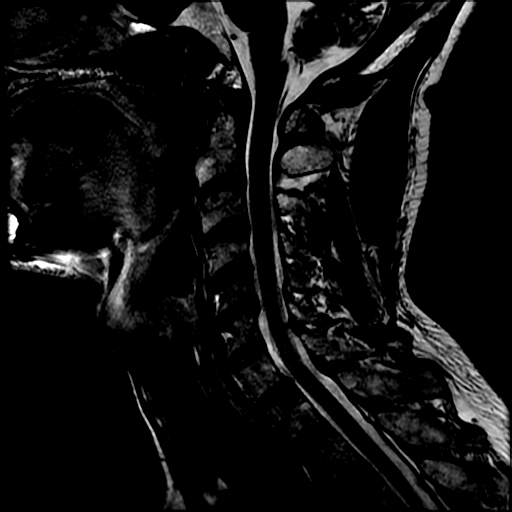
[im 11/14]
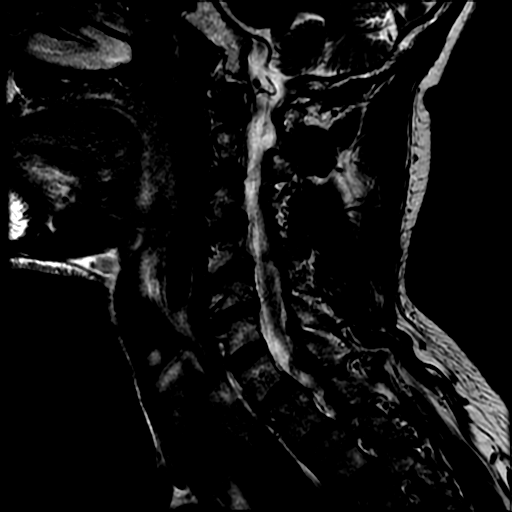
[im 14/14]
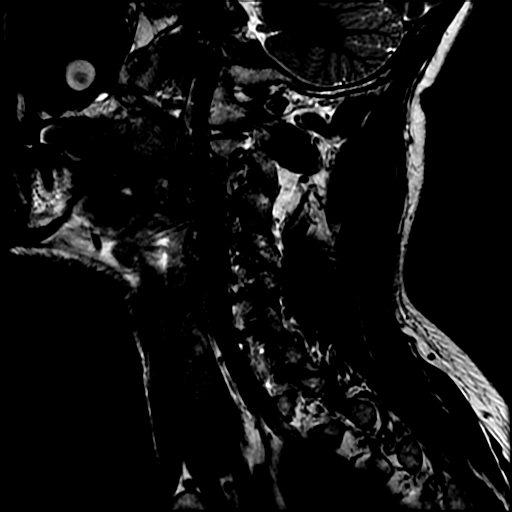

[Series 4: T1 · sagittal · 3.0mm · 0.39mm/px · 3 of 14 slices shown]
[im 3/14]
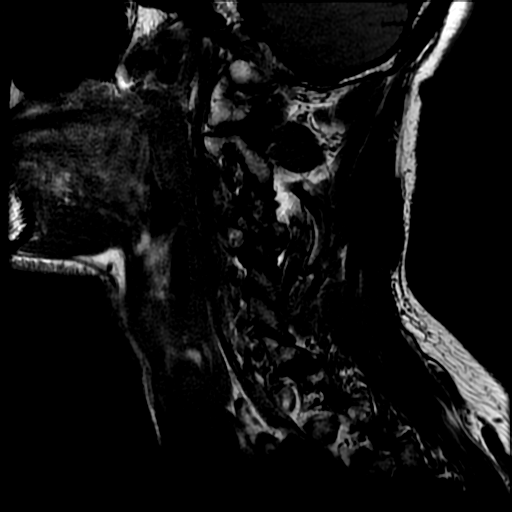
[im 8/14]
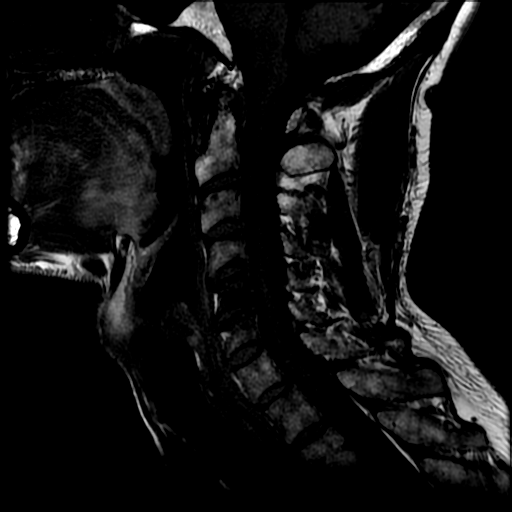
[im 14/14]
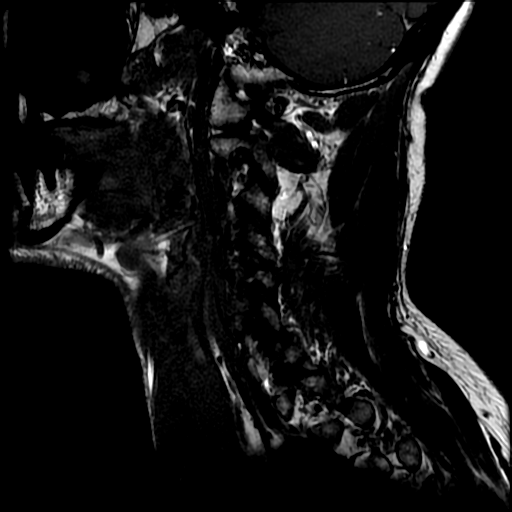

[Series 5: STIR · sagittal · 3.0mm · 0.39mm/px · 3 of 14 slices shown]
[im 3/14]
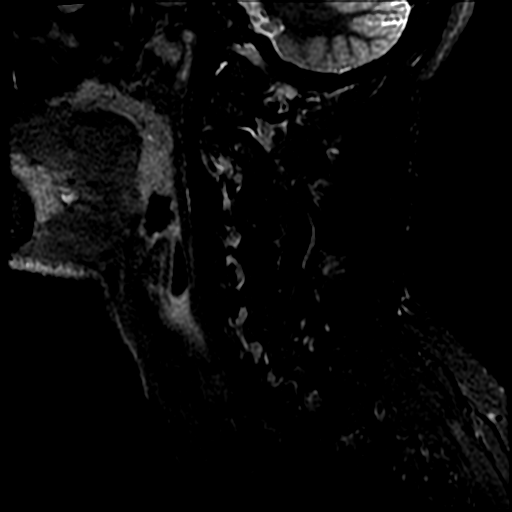
[im 8/14]
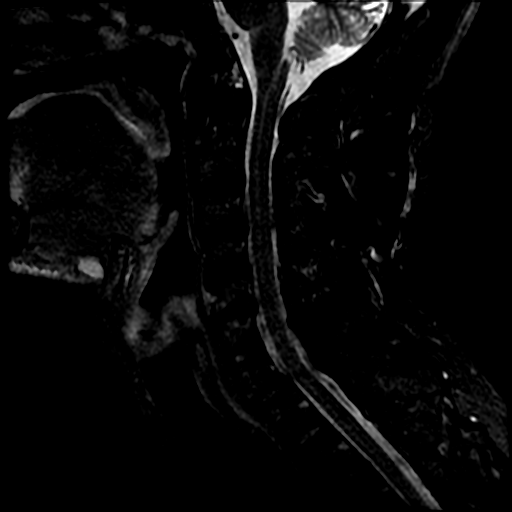
[im 14/14]
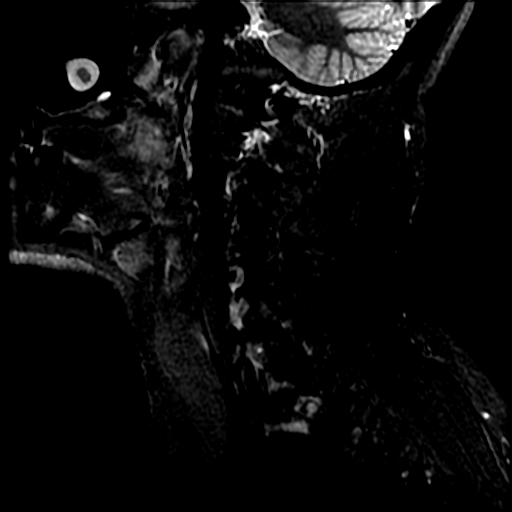

[Series 7: T2 · axial · 3.0mm · 0.35mm/px · z∈[-244,-155]mm · 6 of 35 slices shown (2 of 2)]
[im 1/35]
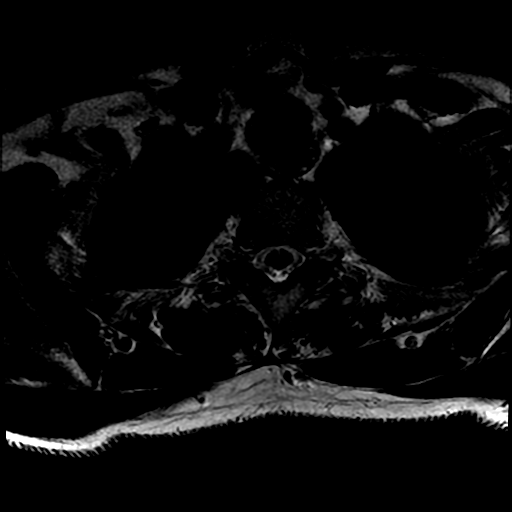
[im 5/35]
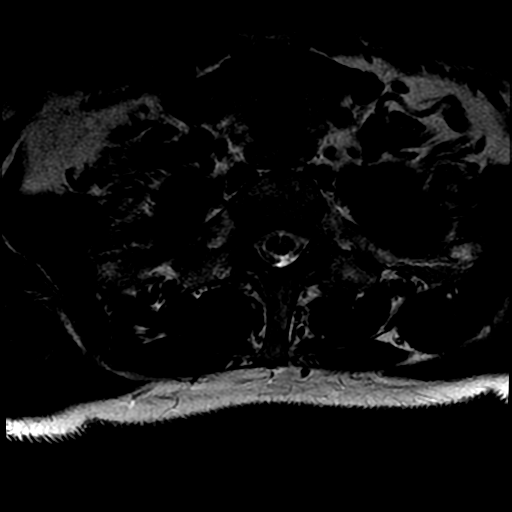
[im 10/35]
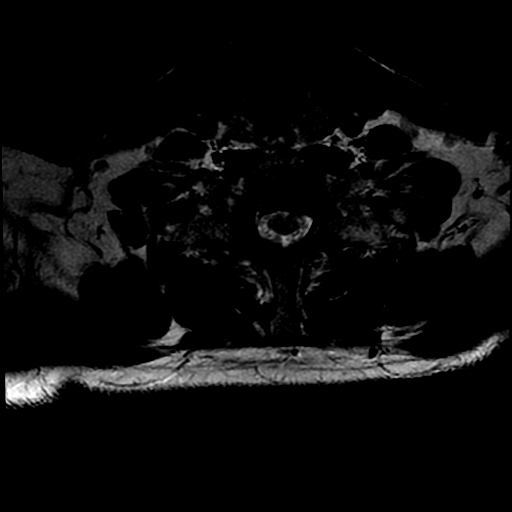
[im 15/35]
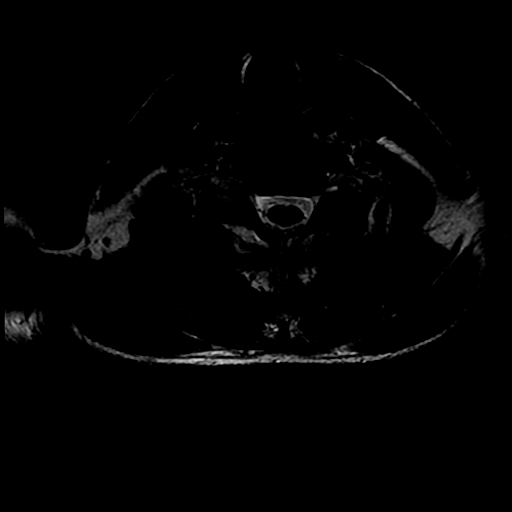
[im 18/35]
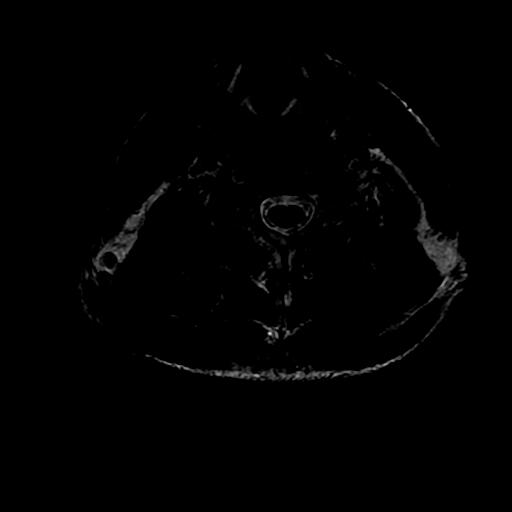
[im 30/35]
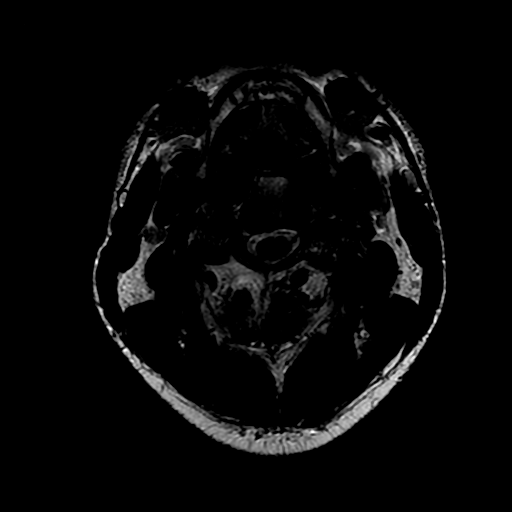

[18 of 48 positions shown; findings below may reference images not displayed]

FINDINGS: Visualized portions of the brain and posterior fossa demonstrate a
normal appearance with normal signal intensity. Craniocervical
junction widely patent.

There is trace anterolisthesis of C7 on T1. Otherwise, vertebral
bodies are normally aligned with preservation of the normal cervical
lordosis. Vertebral body heights are maintained. No acute fracture.
No evidence for ligamentous injury.

Signal intensity within the cervical spinal cord is normal. No
epidural fluid collection.

Paraspinous soft tissues within normal limits. Normal intravascular
flow voids present within the vertebral arteries bilaterally.

C2-3: Left-sided uncovertebral hypertrophy with mild left-sided
facet arthrosis. There is resultant mild left foraminal narrowing.
No significant central canal or right neural foraminal stenosis.

C3-4: Diffuse degenerative disc osteophyte with right greater than
left uncovertebral spurring and facet arthropathy. There is
resultant severe bilateral foraminal stenosis, right worse than
left. Posterior disc osteophyte mildly effaces the ventral thecal
sac and results in mild canal narrowing.

C4-5: Diffuse degenerative disc osteophyte with bilateral
uncovertebral spurring and facet arthrosis. There is resultant
fairly severe bilateral foraminal narrowing, right slightly worse
than left. Posterior disc osteophyte partially effaces the ventral
thecal sac and results in mild canal stenosis.

C5-6: Diffuse multi focal degenerative disc osteophyte with
bilateral uncovertebral spurring, slightly worse on the right. There
is mild bilateral facet arthrosis. There is resultant fairly severe
bilateral foraminal narrowing, right worse than left. Degenerative
disc osteophyte and facet arthrodesis results in mild central canal
stenosis.

C6-7: Bilateral uncovertebral spurring, left worse than right with
bilateral facet arthrosis, also worse on the left. There is
resultant severe left with moderate to severe right foraminal
narrowing. No significant central canal stenosis.

C7-T1: Trace anterior listhesis with bilateral facet arthrosis.
There is resultant moderate to severe bilateral foraminal narrowing
with mild central canal stenosis.
IMPRESSION: 1. No MRI evidence for acute traumatic injury within the cervical
spine.
2. Multilevel degenerative disc disease and facet arthropathy as
detailed above, with resultant fairly severe bilateral foraminal
narrowing at C3-4 through C7-T1. Please see the above report for
full description of these findings.
3. Mild central canal narrowing from C3-4 through C5-6, as well as
at C7-T1. No evidence for cord compression or severe canal
narrowing.

## 2016-09-23 ENCOUNTER — Encounter (HOSPITAL_COMMUNITY): Payer: Self-pay

## 2016-09-23 ENCOUNTER — Emergency Department (HOSPITAL_COMMUNITY)
Admission: EM | Admit: 2016-09-23 | Discharge: 2016-09-23 | Disposition: A | Discharge status: Home / Self Care | Payer: 59 | Attending: Emergency Medicine | Admitting: Emergency Medicine

## 2016-09-23 DIAGNOSIS — M545 Low back pain, unspecified: Secondary | ICD-10-CM

## 2016-09-23 DIAGNOSIS — G8929 Other chronic pain: Secondary | ICD-10-CM | POA: Insufficient documentation

## 2016-09-23 DIAGNOSIS — Z85528 Personal history of other malignant neoplasm of kidney: Secondary | ICD-10-CM | POA: Insufficient documentation

## 2016-09-23 DIAGNOSIS — F17200 Nicotine dependence, unspecified, uncomplicated: Secondary | ICD-10-CM | POA: Diagnosis not present

## 2016-09-23 MED ORDER — DEXAMETHASONE SODIUM PHOSPHATE 10 MG/ML IJ SOLN
10.0000 mg | Freq: Once | INTRAMUSCULAR | Status: AC
  Administered 2016-09-23: 10 mg via INTRAMUSCULAR
  Filled 2016-09-23: qty 1

## 2016-09-23 MED ORDER — OXYCODONE-ACETAMINOPHEN 5-325 MG PO TABS
1.0000 | ORAL_TABLET | Freq: Once | ORAL | Status: AC
  Administered 2016-09-23: 1 via ORAL
  Filled 2016-09-23: qty 1

## 2016-09-23 MED ORDER — CYCLOBENZAPRINE HCL 10 MG PO TABS: 10.0000 mg | ORAL_TABLET | Freq: Two times a day (BID) | ORAL | 0 refills | Status: DC | PRN

## 2016-09-23 MED ORDER — OXYCODONE-ACETAMINOPHEN 5-325 MG PO TABS: 2.0000 | ORAL_TABLET | ORAL | 0 refills | Status: DC | PRN

## 2016-09-23 MED ORDER — KETOROLAC TROMETHAMINE 60 MG/2ML IM SOLN
60.0000 mg | Freq: Once | INTRAMUSCULAR | Status: AC
  Administered 2016-09-23: 60 mg via INTRAMUSCULAR
  Filled 2016-09-23: qty 2

## 2016-09-23 NOTE — ED Triage Notes (Signed)
Patient here with burning to lower back for the past few days with any change in position. States that he had disc removed 12 weeks ago. Arrived ambulatory with steady gait, NAD

## 2016-09-23 NOTE — ED Provider Notes (Signed)
[facility] DEPT Provider Note   CSN: [id number] Arrival date & time: 09/23/16  1516   By signing my name below, I, Eunice Blase, attest that this documentation has been prepared under the direction and in the presence of Gloriann Loan, PA-C. Electronically Signed: Eunice Blase, Scribe. 09/23/16. 4:57 PM.   History   Chief Complaint Chief Complaint  Patient presents with  . Back Pain   The history is provided by the patient and medical records. No language interpreter was used.    HPI Comments: Joshua Lewis is a 56 y.o. male who presents to the Emergency Department complaining of recurrent, chronic back pain, suddenly worsened today. He believes his pain is related to a past surgery > ~12 weeks ago. States he was last seen 1 week ago by his Psychologist, sport and exercise and will see him next in 2 weeks. However, he is asking for a referral for a new neurosurgeon.  Pt currently c/o bony pain, he notes "hard stiffness" in his back, and he describes his low back pain as sharp, dull burning.  Notes he fell to his knees earlier, and reports he took an epsom salt bath following when he notes burning pain began, which radiates to he posterior left thigh. States his thigh pain is exacerbated by lifting his leg. Also reveals it is difficult to get up when at rest. Pt has taken prednisone, z-pack, and further medications that he was prescribed in December, including tramadol and tylenol with codeine. He also notes he takes tylenol pm at night, and states that none of the medications he has taken have provided adequate relief to his pain. No medications today.  Pt denies numbness, weakness, saddle anesthesia, b/b incontinence, fever, chills, abdominal pain, or urinary symptoms. He states he does not take IV or illicit drugs. He is ambulatory.   Past Medical History:  Diagnosis Date  . Kidney carcinoma ([place])     There are no active problems to display for this patient.   Past Surgical History:  Procedure Laterality  Date  . HAND SURGERY    . HERNIA REPAIR    . NEPHRECTOMY     right       Home Medications    Prior to Admission medications   Medication Sig Start Date End Date Taking? Authorizing Provider  cyclobenzaprine (FLEXERIL) 10 MG tablet Take 1 tablet (10 mg total) by mouth 2 (two) times daily as needed for muscle spasms. 09/23/16   Gloriann Loan, PA-C  methocarbamol (ROBAXIN) 500 MG tablet Take 1 tablet (500 mg total) by mouth 2 (two) times daily as needed for muscle spasms. 08/08/16   Fransico Meadow, PA-C  naproxen (NAPROSYN) 250 MG tablet Take 1 tablet (250 mg total) by mouth 2 (two) times daily with a meal. 01/23/16   Waynetta Pean, PA-C  orphenadrine (NORFLEX) 100 MG tablet Take 1 tablet (100 mg total) by mouth 2 (two) times daily. 03/11/16   Charlesetta Shanks, MD  oxyCODONE-acetaminophen (PERCOCET/ROXICET) 5-325 MG tablet Take 2 tablets by mouth every 4 (four) hours as needed for severe pain. 09/23/16   Gloriann Loan, PA-C  predniSONE (DELTASONE) 10 MG tablet 6,5,4,3,2,1 taper 08/08/16   Fransico Meadow, PA-C  traMADol (ULTRAM) 50 MG tablet Take 1 tablet (50 mg total) by mouth every 6 (six) hours as needed. 07/04/15   Glendell Docker, NP  traMADol (ULTRAM) 50 MG tablet Take 2 tablets (100 mg total) by mouth every 6 (six) hours as needed. 03/11/16   Charlesetta Shanks, MD    Family History  Family History  Problem Relation Age of Onset  . Cancer Mother   . Diabetes Mother   . Hypertension Mother     Social History Social History  Substance Use Topics  . Smoking status: Current Some Day Smoker    Packs/day: 0.50  . Smokeless tobacco: Never Used  . Alcohol use No     Allergies   Patient has no known allergies.   Review of Systems Review of Systems  Cardiovascular: Negative for chest pain.  Gastrointestinal: Negative for abdominal pain, diarrhea, nausea and vomiting.  Genitourinary: Positive for frequency. Negative for enuresis.  Musculoskeletal: Positive for arthralgias, back pain and gait  problem.  Neurological: Negative for numbness.     Physical Exam Updated Vital Signs BP 121/73 (BP Location: Left Arm)   Pulse 92   Temp 98 F (36.7 C) (Oral)   Ht 5\' 9"  (1.753 m)   Wt 158 lb (71.7 kg)   SpO2 99%   BMI 23.33 kg/m   Physical Exam  Constitutional: He is oriented to person, place, and time. He appears well-developed and well-nourished.  HENT:  Head: Atraumatic.  Eyes: Conjunctivae are normal.  Cardiovascular: Normal rate, regular rhythm, normal heart sounds and intact distal pulses.   Pulses:      Dorsalis pedis pulses are 2+ on the right side, and 2+ on the left side.  Pulmonary/Chest: Effort normal and breath sounds normal.  Abdominal: Soft. Bowel sounds are normal. He exhibits no distension. There is no tenderness.  Musculoskeletal: He exhibits tenderness.  No spinous process tenderness.  No step offs. No crepitus.  Neurological: He is alert and oriented to person, place, and time.  Reflex Scores:      Patellar reflexes are 2+ on the right side and 2+ on the left side. No saddle anesthesia. Strength and sensation intact bilaterally throughout lower extremities.  Skin: Skin is warm and dry.  Psychiatric: He has a normal mood and affect. His behavior is normal.     ED Treatments / Results  DIAGNOSTIC STUDIES: Oxygen Saturation is 99% on RA, normal by my interpretation.    COORDINATION OF CARE: 4:57 PM Discussed treatment plan with pt at bedside and pt agreed to plan.  Labs (all labs ordered are listed, but only abnormal results are displayed) Labs Reviewed - No data to display  EKG  EKG Interpretation None       Radiology No results found.  Procedures Procedures (including critical care time)  Medications Ordered in ED Medications  dexamethasone (DECADRON) injection 10 mg (10 mg Intramuscular Given 09/23/16 1651)  ketorolac (TORADOL) injection 60 mg (60 mg Intramuscular Given 09/23/16 1651)  oxyCODONE-acetaminophen (PERCOCET/ROXICET) 5-325  MG per tablet 1 tablet (1 tablet Oral Given 09/23/16 1651)     Initial Impression / Assessment and Plan / ED Course  I have reviewed the triage vital signs and the nursing notes.  Pertinent labs & imaging results that were available during my care of the patient were reviewed by me and considered in my medical decision making (see chart for details).  Clinical Course    Patient with back pain.  No neurological deficits and normal neuro exam.  Patient is ambulatory.  No loss of bowel or bladder control.  No concern for cauda equina.  No fever, night sweats, weight loss, h/o cancer, IVDA, no recent procedure to back. No urinary symptoms suggestive of UTI. Reviewed [place] and no recent prescriptions for narcotics.  Instructed follow up with neurosurgery. Supportive care and return precaution discussed.  Appears safe for discharge at this time. Follow up as indicated in discharge paperwork.    Final Clinical Impressions(s) / ED Diagnoses   Final diagnoses:  Chronic left-sided low back pain without sciatica    New Prescriptions New Prescriptions   CYCLOBENZAPRINE (FLEXERIL) 10 MG TABLET    Take 1 tablet (10 mg total) by mouth 2 (two) times daily as needed for muscle spasms.   OXYCODONE-ACETAMINOPHEN (PERCOCET/ROXICET) 5-325 MG TABLET    Take 2 tablets by mouth every 4 (four) hours as needed for severe pain.   I personally performed the services described in this documentation, which was scribed in my presence. The recorded information has been reviewed and is accurate.    Gloriann Loan, PA-C 09/23/16 [place], MD 09/24/16 [phone]

## 2016-09-23 NOTE — Discharge Instructions (Signed)
Take oxycodone as needed for severe pain.  Take Flexeril for pain as well. Follow up with Neurosurgery, you may need an outpatient MRI.  Return to the ED for any new or concerning symptoms.

## 2016-09-23 NOTE — ED Notes (Signed)
Declined W/C at D/C and was escorted to lobby by RN. 

## 2016-12-03 ENCOUNTER — Encounter (HOSPITAL_COMMUNITY): Payer: Self-pay | Admitting: Emergency Medicine

## 2016-12-03 ENCOUNTER — Emergency Department (HOSPITAL_COMMUNITY)
Admission: EM | Admit: 2016-12-03 | Discharge: 2016-12-03 | Disposition: A | Discharge status: Home / Self Care | Payer: 59 | Attending: Emergency Medicine | Admitting: Emergency Medicine

## 2016-12-03 DIAGNOSIS — L243 Irritant contact dermatitis due to cosmetics: Secondary | ICD-10-CM | POA: Insufficient documentation

## 2016-12-03 DIAGNOSIS — T7840XA Allergy, unspecified, initial encounter: Secondary | ICD-10-CM

## 2016-12-03 DIAGNOSIS — R21 Rash and other nonspecific skin eruption: Secondary | ICD-10-CM | POA: Diagnosis present

## 2016-12-03 DIAGNOSIS — Z79899 Other long term (current) drug therapy: Secondary | ICD-10-CM | POA: Insufficient documentation

## 2016-12-03 DIAGNOSIS — F17200 Nicotine dependence, unspecified, uncomplicated: Secondary | ICD-10-CM | POA: Diagnosis not present

## 2016-12-03 MED ORDER — DIPHENHYDRAMINE HCL 25 MG PO TABS: 25.0000 mg | ORAL_TABLET | Freq: Four times a day (QID) | ORAL | 0 refills | Status: DC | PRN

## 2016-12-03 MED ORDER — CEPHALEXIN 500 MG PO CAPS: 500.0000 mg | ORAL_CAPSULE | Freq: Three times a day (TID) | ORAL | 0 refills | Status: AC

## 2016-12-03 MED ORDER — HYDROCORTISONE 1 % EX CREA: 1.0000 "application " | TOPICAL_CREAM | Freq: Two times a day (BID) | CUTANEOUS | 0 refills | Status: DC

## 2016-12-03 MED ORDER — FAMOTIDINE 20 MG PO TABS
20.0000 mg | ORAL_TABLET | Freq: Once | ORAL | Status: AC
  Administered 2016-12-03: 20 mg via ORAL
  Filled 2016-12-03: qty 1

## 2016-12-03 MED ORDER — HYDROCORTISONE 0.5 % EX OINT
TOPICAL_OINTMENT | Freq: Once | CUTANEOUS | Status: AC
  Administered 2016-12-03: 20:00:00 via TOPICAL
  Filled 2016-12-03: qty 28.35

## 2016-12-03 MED ORDER — PREDNISONE 20 MG PO TABS
60.0000 mg | ORAL_TABLET | Freq: Once | ORAL | Status: AC
  Administered 2016-12-03: 60 mg via ORAL
  Filled 2016-12-03: qty 3

## 2016-12-03 MED ORDER — PREDNISONE 10 MG (21) PO TBPK: ORAL_TABLET | Freq: Every day | ORAL | 0 refills | Status: DC

## 2016-12-03 MED ORDER — DIPHENHYDRAMINE HCL 25 MG PO CAPS
50.0000 mg | ORAL_CAPSULE | Freq: Once | ORAL | Status: AC
  Administered 2016-12-03: 50 mg via ORAL
  Filled 2016-12-03: qty 2

## 2016-12-03 NOTE — Discharge Instructions (Signed)
-  Do NOT ever use Just for Men again; this is an allergic reaction to the dye -Use topical hydrocortisone for one week, but do not use any stronger creams or use for more than one week as it can cause skin pigment changes -If you develop worsening pain or redness, return to the ER immediately -If you begin to feel short of breath, return to the ER immediately

## 2016-12-03 NOTE — ED Provider Notes (Signed)
[facility] DEPT Provider Note   CSN: [id number] Arrival date & time: 12/03/16  9937   By signing my name below, I, Eunice Blase, attest that this documentation has been prepared under the direction and in the presence of Duffy Bruce, MD. Electronically signed, Eunice Blase, ED Scribe. 12/03/16. 7:16 PM.  History   Chief Complaint Chief Complaint  Patient presents with  . Facial Swelling   The history is provided by the patient and medical records. No language interpreter was used.    HPI Comments: Joshua Lewis is a 56 y.o. male who presents to the Emergency Department complaining of worsening facial swelling onset yesterday. Swelling noted to cheeks and lips, and he notes associated sore painful skin on swollen areas and difficulty swallowing d/t facial pain. He believes there is a lump on the bottom of his chin. Pt notes swelling began 2-3 hours after using a new product: Just For Men hair dye on his beard. Pt is otherwise healthy with no chronic medical disorders noted. He states he is an occasional smoker. Pt denies dental problems, any other new products than aforementioned, SOB, N/V/D, tongue swelling and visual changes. No fever or chills. No preceding illnesses.  Past Medical History:  Diagnosis Date  . Kidney carcinoma ([place])     There are no active problems to display for this patient.   Past Surgical History:  Procedure Laterality Date  . HAND SURGERY    . HERNIA REPAIR    . NEPHRECTOMY     right       Home Medications    Prior to Admission medications   Medication Sig Start Date End Date Taking? Authorizing Provider  cephALEXin (KEFLEX) 500 MG capsule Take 1 capsule (500 mg total) by mouth 3 (three) times daily. 12/03/16 12/08/16  Duffy Bruce, MD  cyclobenzaprine (FLEXERIL) 10 MG tablet Take 1 tablet (10 mg total) by mouth 2 (two) times daily as needed for muscle spasms. 09/23/16   Gloriann Loan, PA-C  diphenhydrAMINE (BENADRYL) 25 MG tablet Take 1 tablet  (25 mg total) by mouth every 6 (six) hours as needed for itching. 12/03/16   Duffy Bruce, MD  hydrocortisone cream 1 % Apply 1 application topically 2 (two) times daily. For one week 12/03/16   Duffy Bruce, MD  methocarbamol (ROBAXIN) 500 MG tablet Take 1 tablet (500 mg total) by mouth 2 (two) times daily as needed for muscle spasms. 08/08/16   Fransico Meadow, PA-C  naproxen (NAPROSYN) 250 MG tablet Take 1 tablet (250 mg total) by mouth 2 (two) times daily with a meal. 01/23/16   Waynetta Pean, PA-C  orphenadrine (NORFLEX) 100 MG tablet Take 1 tablet (100 mg total) by mouth 2 (two) times daily. 03/11/16   Charlesetta Shanks, MD  oxyCODONE-acetaminophen (PERCOCET/ROXICET) 5-325 MG tablet Take 2 tablets by mouth every 4 (four) hours as needed for severe pain. 09/23/16   Gloriann Loan, PA-C  predniSONE (STERAPRED UNI-PAK 21 TAB) 10 MG (21) TBPK tablet Take by mouth daily. Take 6 tabs by mouth daily  for 2 days, then 5 tabs for 2 days, then 4 tabs for 2 days, then 3 tabs for 2 days, 2 tabs for 2 days, then 1 tab by mouth daily for 2 days 12/03/16   Duffy Bruce, MD  traMADol (ULTRAM) 50 MG tablet Take 1 tablet (50 mg total) by mouth every 6 (six) hours as needed. 07/04/15   Glendell Docker, NP  traMADol (ULTRAM) 50 MG tablet Take 2 tablets (100 mg total) by mouth every  6 (six) hours as needed. 03/11/16   Charlesetta Shanks, MD    Family History Family History  Problem Relation Age of Onset  . Cancer Mother   . Diabetes Mother   . Hypertension Mother     Social History Social History  Substance Use Topics  . Smoking status: Current Some Day Smoker    Packs/day: 0.50  . Smokeless tobacco: Never Used  . Alcohol use No     Allergies   Patient has no known allergies.   Review of Systems Review of Systems  Constitutional:       +new product  HENT: Positive for facial swelling. Negative for dental problem, sore throat and trouble swallowing.   Eyes: Negative for visual disturbance.    Gastrointestinal: Negative for diarrhea, nausea and vomiting.  Skin: Positive for rash.  All other systems reviewed and are negative.    Physical Exam Updated Vital Signs BP (!) 154/96 (BP Location: Left Arm)   Pulse 81   Temp 98 F (36.7 C) (Oral)   Resp 18   SpO2 100%   Physical Exam  Constitutional: He is oriented to person, place, and time. He appears well-developed and well-nourished. No distress.  HENT:  Head: Normocephalic and atraumatic.  Mouth/Throat: Oropharynx is clear and moist.  Skin thickening and edema along hairline distribution of beard, with moderate TTP. Mild skin breakdown diffusely with excoriations. No erythema. No fluctuance or drainage. No sublingual or lingual edema. No oral swelling. OP widely patent. Uvula midline. No tonsillar erythema or edema.  Eyes: Conjunctivae are normal.  Neck: Neck supple.  Full, painless ROM. No stridor. Normal phonation. Mild superficial skin TTP over inferior aspect of beard. No tracheal tenderness.  Cardiovascular: Normal rate, regular rhythm and normal heart sounds.   Pulmonary/Chest: Effort normal. No respiratory distress. He has no wheezes.  Abdominal: He exhibits no distension.  Musculoskeletal: He exhibits no edema.  Neurological: He is alert and oriented to person, place, and time. He exhibits normal muscle tone.  Skin: Skin is warm. Capillary refill takes less than 2 seconds. No rash noted.  Nursing note and vitals reviewed.        ED Treatments / Results  DIAGNOSTIC STUDIES: Oxygen Saturation is 100% on RA, normal by my interpretation.    COORDINATION OF CARE: 7:12 PM Discussed treatment plan with pt at bedside and pt agreed to plan. Will order medications.  Labs (all labs ordered are listed, but only abnormal results are displayed) Labs Reviewed - No data to display  EKG  EKG Interpretation None       Radiology No results found.  Procedures Procedures (including critical care  time)  Medications Ordered in ED Medications  diphenhydrAMINE (BENADRYL) capsule 50 mg (50 mg Oral Given 12/03/16 1944)  predniSONE (DELTASONE) tablet 60 mg (60 mg Oral Given 12/03/16 1944)  famotidine (PEPCID) tablet 20 mg (20 mg Oral Given 12/03/16 1944)  hydrocortisone ointment 0.5 % ( Topical Given 12/03/16 1946)     Initial Impression / Assessment and Plan / ED Course  I have reviewed the triage vital signs and the nursing notes.  Pertinent labs & imaging results that were available during my care of the patient were reviewed by me and considered in my medical decision making (see chart for details).    56 yo F with PMHx as above here with facial swelling and pain after using Just for Men dye. On exam, pt has moderate facial edema directly in distribution of application of dye. His reported neck  swelling/difficulty swallowing correlates to tenderness along the underside of his chin along inferior aspect of beard. He has no sublingual or lingual swelling, oral swelling, tooth pain or other evidence to suggest Ludwig's, dental abscess, facial abscess, or other infectious etiology. No fevers, stridor, normal phonation, no signs of epiglottitis, RPA, or deeper neck infection. History is highly c/w irritant contact dermatitis. I will advise him to use topical hydrocortisone (low potency 2/2 use on face), oral steroids, antihistamines for itching. Of note, tp has multiple excoriations 2/2 rubbing his beard to remove the dye - will give ppx keflex to prevent superinfection. Will d/c with strict return precautions.  Final Clinical Impressions(s) / ED Diagnoses   Final diagnoses:  Allergic reaction, initial encounter  Irritant contact dermatitis due to cosmetics    New Prescriptions Discharge Medication List as of 12/03/2016  7:22 PM    START taking these medications   Details  cephALEXin (KEFLEX) 500 MG capsule Take 1 capsule (500 mg total) by mouth 3 (three) times daily., Starting Sun  12/03/2016, Until Fri 12/08/2016, Print    diphenhydrAMINE (BENADRYL) 25 MG tablet Take 1 tablet (25 mg total) by mouth every 6 (six) hours as needed for itching., Starting Sun 12/03/2016, Print    hydrocortisone cream 1 % Apply 1 application topically 2 (two) times daily. For one week, Starting Sun 12/03/2016, Print    predniSONE (STERAPRED UNI-PAK 21 TAB) 10 MG (21) TBPK tablet Take by mouth daily. Take 6 tabs by mouth daily  for 2 days, then 5 tabs for 2 days, then 4 tabs for 2 days, then 3 tabs for 2 days, 2 tabs for 2 days, then 1 tab by mouth daily for 2 days, Starting Sun 12/03/2016, Print        I personally performed the services described in this documentation, which was scribed in my presence. The recorded information has been reviewed and is accurate.    Duffy Bruce, MD 12/03/16 2009

## 2016-12-03 NOTE — ED Triage Notes (Signed)
Pt st's he noticed face and lips starting to swell yesterday after using a hair dye.  Pt st's today the swelling is worse and his throat is sore.  No swelling noted to tongue.  No resp distress present at this time

## 2016-12-03 NOTE — ED Notes (Signed)
Pt swallowed medications with no difficulty; resp e/u.

## 2017-01-28 ENCOUNTER — Emergency Department (HOSPITAL_COMMUNITY)
Admission: EM | Admit: 2017-01-28 | Discharge: 2017-01-28 | Disposition: A | Discharge status: Home / Self Care | Payer: 59 | Attending: Emergency Medicine | Admitting: Emergency Medicine

## 2017-01-28 ENCOUNTER — Encounter (HOSPITAL_COMMUNITY): Payer: Self-pay | Admitting: Emergency Medicine

## 2017-01-28 DIAGNOSIS — F17200 Nicotine dependence, unspecified, uncomplicated: Secondary | ICD-10-CM | POA: Insufficient documentation

## 2017-01-28 DIAGNOSIS — W268XXA Contact with other sharp object(s), not elsewhere classified, initial encounter: Secondary | ICD-10-CM | POA: Diagnosis not present

## 2017-01-28 DIAGNOSIS — Y999 Unspecified external cause status: Secondary | ICD-10-CM | POA: Diagnosis not present

## 2017-01-28 DIAGNOSIS — Y929 Unspecified place or not applicable: Secondary | ICD-10-CM | POA: Insufficient documentation

## 2017-01-28 DIAGNOSIS — S59912A Unspecified injury of left forearm, initial encounter: Secondary | ICD-10-CM | POA: Diagnosis present

## 2017-01-28 DIAGNOSIS — S51812A Laceration without foreign body of left forearm, initial encounter: Secondary | ICD-10-CM

## 2017-01-28 DIAGNOSIS — Y939 Activity, unspecified: Secondary | ICD-10-CM | POA: Insufficient documentation

## 2017-01-28 MED ORDER — LIDOCAINE HCL (PF) 1 % IJ SOLN
5.0000 mL | Freq: Once | INTRAMUSCULAR | Status: AC
  Administered 2017-01-28: 5 mL
  Filled 2017-01-28: qty 5

## 2017-01-28 NOTE — ED Triage Notes (Addendum)
Has 2 lacerations to left lower arm, both about 2 1/2 inches long, bleeding controlled at this time , told me it was done by chainsaw and told Hassell Done he did it to himself , denies SI or HI, last tetanus shot last year he states

## 2017-01-28 NOTE — ED Provider Notes (Signed)
[facility] DEPT Provider Note   CSN: [id number] Arrival date & time: 01/28/17  1415     History   Chief Complaint Chief Complaint  Patient presents with  . Arm Injury    HPI Joshua Lewis is a 56 y.o. male.  HPI Patient states he was using a small handsaw and was not paying attention to what is doing. He reports that he accidentally cut his left forearm. He reports it stings and burns. Injury occurred just prior to arrival. He denies this was intentional. He reports he was just not paying attention. He denies any suicidal ideation. He denies any intent around self injury. Past Medical History:  Diagnosis Date  . Kidney carcinoma ([place])     There are no active problems to display for this patient.   Past Surgical History:  Procedure Laterality Date  . HAND SURGERY    . HERNIA REPAIR    . NEPHRECTOMY     right       Home Medications    Prior to Admission medications   Medication Sig Start Date End Date Taking? Authorizing Provider  cyclobenzaprine (FLEXERIL) 10 MG tablet Take 1 tablet (10 mg total) by mouth 2 (two) times daily as needed for muscle spasms. 09/23/16   Gloriann Loan, PA-C  diphenhydrAMINE (BENADRYL) 25 MG tablet Take 1 tablet (25 mg total) by mouth every 6 (six) hours as needed for itching. 12/03/16   Duffy Bruce, MD  hydrocortisone cream 1 % Apply 1 application topically 2 (two) times daily. For one week 12/03/16   Duffy Bruce, MD  methocarbamol (ROBAXIN) 500 MG tablet Take 1 tablet (500 mg total) by mouth 2 (two) times daily as needed for muscle spasms. 08/08/16   Fransico Meadow, PA-C  naproxen (NAPROSYN) 250 MG tablet Take 1 tablet (250 mg total) by mouth 2 (two) times daily with a meal. 01/23/16   Waynetta Pean, PA-C  orphenadrine (NORFLEX) 100 MG tablet Take 1 tablet (100 mg total) by mouth 2 (two) times daily. 03/11/16   Charlesetta Shanks, MD  oxyCODONE-acetaminophen (PERCOCET/ROXICET) 5-325 MG tablet Take 2 tablets by mouth every 4 (four) hours as  needed for severe pain. 09/23/16   Gloriann Loan, PA-C  predniSONE (STERAPRED UNI-PAK 21 TAB) 10 MG (21) TBPK tablet Take by mouth daily. Take 6 tabs by mouth daily  for 2 days, then 5 tabs for 2 days, then 4 tabs for 2 days, then 3 tabs for 2 days, 2 tabs for 2 days, then 1 tab by mouth daily for 2 days 12/03/16   Duffy Bruce, MD  traMADol (ULTRAM) 50 MG tablet Take 1 tablet (50 mg total) by mouth every 6 (six) hours as needed. 07/04/15   Glendell Docker, NP  traMADol (ULTRAM) 50 MG tablet Take 2 tablets (100 mg total) by mouth every 6 (six) hours as needed. 03/11/16   Charlesetta Shanks, MD    Family History Family History  Problem Relation Age of Onset  . Cancer Mother   . Diabetes Mother   . Hypertension Mother     Social History Social History  Substance Use Topics  . Smoking status: Current Some Day Smoker    Packs/day: 0.50  . Smokeless tobacco: Never Used  . Alcohol use No     Allergies   Patient has no known allergies.   Review of Systems Review of Systems Constitutional: No recent fever chills or general illness Respiratory: No recent cough or shortness of breath  Physical Exam Updated Vital Signs BP 114/79  Pulse 75   Temp 98.9 F (37.2 C) (Oral)   Resp 16   SpO2 96%   Physical Exam  Constitutional: He is oriented to person, place, and time. He appears well-developed and well-nourished. No distress.  HENT:  Head: Normocephalic and atraumatic.  Eyes: EOM are normal.  Pulmonary/Chest: Effort normal.  Musculoskeletal:  2 linear abrasions to left volar forearm proximal one third. The first laceration 5 cm through the deep dermis. Does not penetrate subcutaneous fascia. The second laceration superficial without penetrating deeper dermal layers 3 cm. Forearm otherwise normal without deformity or neurovascular injury.  Neurological: He is alert and oriented to person, place, and time. No cranial nerve deficit. He exhibits normal muscle tone. Coordination normal.    Skin: Skin is warm and dry.  Psychiatric: He has a normal mood and affect.     ED Treatments / Results  Labs (all labs ordered are listed, but only abnormal results are displayed) Labs Reviewed - No data to display  EKG  EKG Interpretation None       Radiology No results found.  Procedures .Marland KitchenLaceration Repair Date/Time: 01/28/2017 4:08 PM Performed by: Charlesetta Shanks Authorized by: Charlesetta Shanks   Consent:    Consent obtained:  Verbal   Consent given by:  Patient Anesthesia (see MAR for exact dosages):    Anesthesia method:  Local infiltration   Local anesthetic:  Lidocaine 1% w/o epi Laceration details:    Location:  Shoulder/arm   Shoulder/arm location:  L lower arm   Length (cm):  5   Depth (mm):  3 Pre-procedure details:    Preparation:  Patient was prepped and draped in usual sterile fashion Exploration:    Wound extent: no fascia violation noted, no foreign bodies/material noted, no muscle damage noted, no nerve damage noted, no tendon damage noted, no underlying fracture noted and no vascular damage noted     Contaminated: no   Treatment:    Area cleansed with:  Shur-Clens   Amount of cleaning:  Standard Skin repair:    Repair method:  Sutures   Suture size:  4-0   Suture material:  Prolene   Suture technique:  Simple interrupted   Number of sutures:  7 Approximation:    Approximation:  Close   Vermilion border: well-aligned   Post-procedure details:    Dressing:  Antibiotic ointment Comments:     The central portion of laceration repaired with interrupted sutures that were to the deep dermis. Superficial laceration just to the dermis closed with Dermabond.   (including critical care time)  Medications Ordered in ED Medications  lidocaine (PF) (XYLOCAINE) 1 % injection 5 mL (5 mLs Infiltration Given 01/28/17 1537)     Initial Impression / Assessment and Plan / ED Course  I have reviewed the triage vital signs and the nursing  notes.  Pertinent labs & imaging results that were available during my care of the patient were reviewed by me and considered in my medical decision making (see chart for details).     Final Clinical Impressions(s) / ED Diagnoses   Final diagnoses:  Laceration of left forearm, initial encounter   Laceration to the forearm, superficial to the fascia. Repaired as outlined. Patient is alert and appropriate. He shows no signs of intoxication or confusion. I have extensively discussed with him any intent of self injury which patient denies. He does not appear depressed, intoxicated or showing any signs of psychosis. At this time I had no suspicion of self injury. Care of  laceration reviewed with the patient. New Prescriptions New Prescriptions   No medications on file     Charlesetta Shanks, MD 01/28/17 [phone]

## 2017-01-28 NOTE — Discharge Instructions (Signed)
See your family doctor or return to the emergency department for removal of your stitches in 10 days.  Keep the wound clean and dry with mild soapy water and pat dry. Apply antibiotic ointment only to the area with sutures. Antibiotic ointment will remove the Dermabond glue. Only allow water to go over the Dermabond area and pat dry.

## 2017-01-28 NOTE — ED Notes (Signed)
Dermabond at the bedside.

## 2017-01-28 NOTE — ED Notes (Signed)
Pt reports the laceration was an accident, he was holding the chainsaw when he switched hands and it sliced his arm. Pt denies SI/HI

## 2017-10-23 ENCOUNTER — Emergency Department (HOSPITAL_COMMUNITY): Payer: Self-pay

## 2017-10-23 ENCOUNTER — Other Ambulatory Visit: Payer: Self-pay

## 2017-10-23 ENCOUNTER — Encounter (HOSPITAL_COMMUNITY): Payer: Self-pay | Admitting: *Deleted

## 2017-10-23 ENCOUNTER — Emergency Department (HOSPITAL_COMMUNITY)
Admission: EM | Admit: 2017-10-23 | Discharge: 2017-10-23 | Disposition: A | Discharge status: Home / Self Care | Payer: Self-pay | Attending: Emergency Medicine | Admitting: Emergency Medicine

## 2017-10-23 DIAGNOSIS — R079 Chest pain, unspecified: Secondary | ICD-10-CM | POA: Insufficient documentation

## 2017-10-23 DIAGNOSIS — C649 Malignant neoplasm of unspecified kidney, except renal pelvis: Secondary | ICD-10-CM | POA: Insufficient documentation

## 2017-10-23 DIAGNOSIS — F17210 Nicotine dependence, cigarettes, uncomplicated: Secondary | ICD-10-CM | POA: Insufficient documentation

## 2017-10-23 LAB — BASIC METABOLIC PANEL
Anion gap: 11 (ref 5–15)
BUN: 9 mg/dL (ref 6–20)
CO2: 23 mmol/L (ref 22–32)
Calcium: 9 mg/dL (ref 8.9–10.3)
Chloride: 106 mmol/L (ref 101–111)
Creatinine, Ser: 1.2 mg/dL (ref 0.61–1.24)
GFR calc Af Amer: >60 mL/min (ref >60)
GFR calc non Af Amer: >60 mL/min (ref >60)
Glucose, Bld: 86 mg/dL (ref 65–99)
Potassium: 4.4 mmol/L (ref 3.5–5.1)
Sodium: 140 mmol/L (ref 135–145)

## 2017-10-23 LAB — CBC
HCT: 44.4 % (ref 39.0–52.0)
Hemoglobin: 14.6 g/dL (ref 13.0–17.0)
MCH: 29.5 pg (ref 26.0–34.0)
MCHC: 32.9 g/dL (ref 30.0–36.0)
MCV: 89.7 fL (ref 78.0–100.0)
Platelets: 250 10*3/uL (ref 150–400)
RBC: 4.95 MIL/uL (ref 4.22–5.81)
RDW: 13 % (ref 11.5–15.5)
WBC: 13 10*3/uL — ABNORMAL HIGH (ref 4.0–10.5)

## 2017-10-23 LAB — I-STAT TROPONIN, ED: Troponin i, poc: 0 ng/mL (ref 0.00–0.08)

## 2017-10-23 LAB — D-DIMER, QUANTITATIVE: D-Dimer, Quant: 0.38 ug/mL-FEU (ref 0.00–0.50)

## 2017-10-23 MED ORDER — KETOROLAC TROMETHAMINE 30 MG/ML IJ SOLN
30.0000 mg | Freq: Once | INTRAMUSCULAR | Status: AC
Start: 2017-10-23 — End: 2017-10-23
  Administered 2017-10-23: 30 mg via INTRAVENOUS

## 2017-10-23 MED ORDER — MORPHINE SULFATE (PF) 4 MG/ML IV SOLN
4.0000 mg | Freq: Once | INTRAVENOUS | Status: AC
  Administered 2017-10-23: 4 mg via INTRAVENOUS
  Filled 2017-10-23: qty 1

## 2017-10-23 MED ORDER — GI COCKTAIL ~~LOC~~
30.0000 mL | Freq: Once | ORAL | Status: AC
  Administered 2017-10-23: 30 mL via ORAL
  Filled 2017-10-23: qty 30

## 2017-10-23 MED ORDER — CYCLOBENZAPRINE HCL 10 MG PO TABS
5.0000 mg | ORAL_TABLET | Freq: Once | ORAL | Status: AC
  Administered 2017-10-23: 5 mg via ORAL
  Filled 2017-10-23: qty 1

## 2017-10-23 MED ORDER — KETOROLAC TROMETHAMINE 30 MG/ML IJ SOLN
30.0000 mg | Freq: Once | INTRAMUSCULAR | Status: DC
  Filled 2017-10-23: qty 1

## 2017-10-23 MED ORDER — CYCLOBENZAPRINE HCL 5 MG PO TABS: 10.0000 mg | ORAL_TABLET | Freq: Two times a day (BID) | ORAL | 0 refills | Status: DC | PRN

## 2017-10-23 NOTE — ED Provider Notes (Signed)
[facility] EMERGENCY DEPARTMENT Provider Note   CSN: [id number] Arrival date & time: 10/23/17  1049     History   Chief Complaint Chief Complaint  Patient presents with  . Chest Pain    HPI Joshua Lewis is a 57 y.o. male.  HPI   79 YOM with a history of renal cell carcinoma (in remission) arrives at the emergency department complaining of chest pain. The pain began 4 days ago, gradually. It is described as burning with a heavy weight-like sensations. It is located in the right ches region and nonradiating. The pain is constant made worse by any type of movement or deep breathing. Pt denies  nausea, vomiting, diaphoresis, dyspnea, syncope, palpitations, fever, peripheral edema, abdominal pain, cough, or hemoptysis.  Pt reports this is new from previous episode of CP.  Patient denies history of DVT/PE, testosterone use, recent immobilization, hospitalization, surgery, hemoptysis, active cancer treatment, or lower extremity edema.  Patient has no history of MI.  Patient is not followed by cardiology.  Patient without known history of CAD.  Patient does smoke cigarettes daily, but does not carry diagnosis of COPD.  Patient reports that he believes he may have acid reflux in the past, however cannot recall.  Patient has been trying to bear aspirin and Tylenol without relief.  Patient does perform heavy lifting of at least 50 pounds on a daily basis in his volunteer and work.  Of note, patient is not followed by oncology for history of RCC.   Past Medical History:  Diagnosis Date  . Kidney carcinoma ([place])     There are no active problems to display for this patient.   Past Surgical History:  Procedure Laterality Date  . HAND SURGERY    . HERNIA REPAIR    . NEPHRECTOMY     right       Home Medications    Prior to Admission medications   Medication Sig Start Date End Date Taking? Authorizing Provider  cyclobenzaprine (FLEXERIL) 10 MG tablet Take 1 tablet (10  mg total) by mouth 2 (two) times daily as needed for muscle spasms. Patient not taking: Reported on 01/28/2017 09/23/16   Gloriann Loan, PA-C  diphenhydrAMINE (BENADRYL) 25 MG tablet Take 1 tablet (25 mg total) by mouth every 6 (six) hours as needed for itching. Patient not taking: Reported on 01/28/2017 12/03/16   Duffy Bruce, MD  hydrocortisone cream 1 % Apply 1 application topically 2 (two) times daily. For one week Patient not taking: Reported on 01/28/2017 12/03/16   Duffy Bruce, MD  methocarbamol (ROBAXIN) 500 MG tablet Take 1 tablet (500 mg total) by mouth 2 (two) times daily as needed for muscle spasms. Patient not taking: Reported on 01/28/2017 08/08/16   Fransico Meadow, PA-C  naproxen (NAPROSYN) 250 MG tablet Take 1 tablet (250 mg total) by mouth 2 (two) times daily with a meal. Patient not taking: Reported on 01/28/2017 01/23/16   Waynetta Pean, PA-C  orphenadrine (NORFLEX) 100 MG tablet Take 1 tablet (100 mg total) by mouth 2 (two) times daily. Patient not taking: Reported on 01/28/2017 03/11/16   Charlesetta Shanks, MD  oxyCODONE-acetaminophen (PERCOCET/ROXICET) 5-325 MG tablet Take 2 tablets by mouth every 4 (four) hours as needed for severe pain. Patient not taking: Reported on 01/28/2017 09/23/16   Gloriann Loan, PA-C  predniSONE (STERAPRED UNI-PAK 21 TAB) 10 MG (21) TBPK tablet Take by mouth daily. Take 6 tabs by mouth daily  for 2 days, then 5 tabs for 2 days,  then 4 tabs for 2 days, then 3 tabs for 2 days, 2 tabs for 2 days, then 1 tab by mouth daily for 2 days Patient not taking: Reported on 01/28/2017 12/03/16   Duffy Bruce, MD  traMADol (ULTRAM) 50 MG tablet Take 1 tablet (50 mg total) by mouth every 6 (six) hours as needed. Patient not taking: Reported on 01/28/2017 07/04/15   Glendell Docker, NP  traMADol (ULTRAM) 50 MG tablet Take 2 tablets (100 mg total) by mouth every 6 (six) hours as needed. Patient not taking: Reported on 01/28/2017 03/11/16   Charlesetta Shanks, MD    Ibuprofen-Diphenhydramine HCl (ADVIL PM) 200-25 MG CAPS Take 1 tablet by mouth at bedtime. To help sleep  11/25/11  [provider]    Family History Family History  Problem Relation Age of Onset  . Cancer Mother   . Diabetes Mother   . Hypertension Mother     Social History Social History   Tobacco Use  . Smoking status: Current Some Day Smoker    Packs/day: 0.50  . Smokeless tobacco: Never Used  Substance Use Topics  . Alcohol use: No  . Drug use: No     Allergies   Patient has no known allergies.   Review of Systems Review of Systems  Constitutional: Negative for chills and fever.  Respiratory: Positive for chest tightness. Negative for cough, shortness of breath and wheezing.   Cardiovascular: Positive for chest pain.  Gastrointestinal: Negative for abdominal pain, nausea and vomiting.  Musculoskeletal: Positive for myalgias.  Neurological: Negative for headaches.  All other systems reviewed and are negative.    Physical Exam Updated Vital Signs BP (!) 155/69 (BP Location: Left Arm)   Pulse 68   Temp 98.3 F (36.8 C)   Resp 20   Ht 5\' 9"  (1.753 m)   Wt 78.9 kg (174 lb)   SpO2 98%   BMI 25.70 kg/m   Physical Exam  Constitutional: He appears well-developed and well-nourished. No distress.  HENT:  Head: Normocephalic and atraumatic.  Mouth/Throat: Oropharynx is clear and moist.  Eyes: Conjunctivae and EOM are normal. Pupils are equal, round, and reactive to light.  Neck: Normal range of motion. Neck supple.  Cardiovascular: Normal rate, regular rhythm, S1 normal and S2 normal.  No murmur heard. Pulses:      Radial pulses are 2+ on the right side, and 2+ on the left side.       Dorsalis pedis pulses are 2+ on the right side, and 2+ on the left side.  Pulmonary/Chest: Effort normal and breath sounds normal. He has no wheezes. He has no rales.  Abdominal: Soft. He exhibits no distension. There is no tenderness. There is no guarding.   Musculoskeletal: Normal range of motion. He exhibits no edema or deformity.  Lymphadenopathy:    He has no cervical adenopathy.  Neurological: He is alert.  Cranial nerves grossly intact. Patient moves extremities symmetrically and with good coordination. Normal and symmetric gait.  Skin: Skin is warm and dry. No rash noted. No erythema.  Psychiatric: He has a normal mood and affect. His behavior is normal. Judgment and thought content normal.  Nursing note and vitals reviewed.    ED Treatments / Results  Labs (all labs ordered are listed, but only abnormal results are displayed) Labs Reviewed  CBC - Abnormal; Notable for the following components:      Result Value   WBC 13.0 (*)    All other components within normal limits  BASIC  METABOLIC PANEL  I-STAT TROPONIN, ED    EKG  EKG Interpretation  Date/Time:  Tuesday October 23 2017 10:54:45 EST Ventricular Rate:  90 PR Interval:  146 QRS Duration: 66 QT Interval:  340 QTC Calculation: 415 R Axis:   81 Text Interpretation:  Normal sinus rhythm Biatrial enlargement Septal infarct , age undetermined Abnormal ECG When comapred to prior, t wave inverted in AVL .  No STEMI Confirmed by Antony Blackbird [phone]) on 10/23/2017 6:24:40 PM       Radiology Dg Chest 2 View  Result Date: 10/23/2017 CLINICAL DATA:  Right-sided chest pain.  Worse with movement. EXAM: CHEST  2 VIEW COMPARISON:  Two-view chest x-ray 06/19/2014 FINDINGS: Heart size normal. There is no edema or effusion. Visualized soft tissues and bony thorax are unremarkable. Exaggerated kyphosis of the thoracic spine is stable. Degenerative endplate changes have progressed. IMPRESSION: 1. No acute cardiopulmonary disease. 2. Slight exaggeration of moderate thoracic kyphosis. Electronically Signed   By: San Morelle M.D.   On: 10/23/2017 11:35    Procedures Procedures (including critical care time)  Medications Ordered in ED Medications  gi cocktail  (Maalox,Lidocaine,Donnatal) (30 mLs Oral Given 10/23/17 1315)  morphine 4 MG/ML injection 4 mg (4 mg Intravenous Given 10/23/17 1418)  cyclobenzaprine (FLEXERIL) tablet 5 mg (5 mg Oral Given 10/23/17 1614)  ketorolac (TORADOL) 30 MG/ML injection 30 mg (30 mg Intravenous Given 10/23/17 1616)   Initial Impression / Assessment and Plan / ED Course  I have reviewed the triage vital signs and the nursing notes.  Pertinent labs & imaging results that were available during my care of the patient were reviewed by me and considered in my medical decision making (see chart for details).     Patient is nontoxic-appearing, afebrile, non-tachycardic and normotensive. Differential diagnosis includes ACS, PE, thoracic aortic dissection, Boerhaave's syndrome, cardiac tamponade, pneumothorax, incarcerated diaphragmatic hernia, cholecystitis, esophageal spasm, gastroesophageal reflux, herpes zoster of the thorax, pericarditis, pneumonia, chest wall pain, costochondritis. Case discussed with Dr. Davonna Belling.  Doubt ACS, as initial troponin is negative and pain has been constant x4 days, initial EKG similar with no contiguous ST depressions, T wave inversions. to prior and HEART score 2 (age, smoking).  History not consistent with ischemic CP picture with right sided CP reproducible on palpation x 4 days with no high risk features associated such as radiation, nausea, diaphoresis, or precipitated by exertion. Patient is not tachycardic, and Wells is negative as well as PERC score of 1 for age >45, however due to the pleuritic nature of the pain, and initial tachycardic reading of 101 d-dimer ordered, which is negative. Doubt TAD by hx, CXR showed no widening mediastinum, and pulses equal in all extremities. Suspect chest wall pain secondary to heavy lifting. Patient remained nontoxic appearing and in no acute distress during emergency department course. Minimal improvement with GI cocktail but patient had further  improvement with IV analgesia. Vital signs stable in the emergency department. Therefore, doubt esophageal rupture, cardiac tamponade, or pneumothorax. Pericarditis less likely due to no preceding infectious symptoms and pain not improved in upright positions. Abnormal labs include mild leukocytosis of 13.  Patient given strict return precautions for any recurrent chest pain, shortness of breath, dizziness, lightheadedness, syncope or presyncope.  Case management was consulted to assist patient in obtaining PCP for further primary care.  Patient unable to stay to meet personally with CM, however case management consult placed and verified correct numbers in the chart to contact patient.   Final Clinical Impressions(s) /  ED Diagnoses   Final diagnoses:  Right-sided chest pain    ED Discharge Orders        Ordered    cyclobenzaprine (FLEXERIL) 5 MG tablet  2 times daily PRN     10/23/17 1649       Tamala Julian 10/23/17 Jodelle Red, MD 10/26/17 2050

## 2017-10-23 NOTE — ED Notes (Signed)
Home phone- [phone] Cell phone- [phone]

## 2017-10-23 NOTE — ED Triage Notes (Signed)
Pt states that he has had chest pain for several days. Pt states that he has taken OTC medication. Pt states that it has continued. Pain is right sided and constant. Pt stats that it is worse with movement, cough and breathing.

## 2017-10-23 NOTE — ED Notes (Signed)
Patient states pain feels like a burning in his chest, pain made worse by laying down.

## 2017-10-23 NOTE — Discharge Instructions (Signed)
Please read and follow all provided instructions.  Your diagnoses today include:  1. Right-sided chest pain     Tests performed today include: An EKG of your heart A chest x-ray Cardiac enzymes - a blood test for heart muscle damage Blood counts and electrolytes Vital signs. See below for your results today.   Medications prescribed:   Take any prescribed medications only as directed.  You are prescribed flexeril, a muscle relaxant. Some common side effects of this medication include:  Feeling sleepy.  Dizziness. Take care upon going from a seated to a standing position.  Dry mouth.  Feeling tired or weak.  Hard stools (constipation).  Upset stomach. These are not all of the side effects that may occur. If you have questions about side effects, call your doctor. Call your primary care provider for medical advice about side effects.  This medication can be sedating. Only take this medication as needed. Please do not combine with alcohol. Do not drive or operate machinery while taking this medication.   This medication can interact with some other medications. Make sure to tell any provider you are taking this medication before they prescribe you a new medication.   Please use as salon Pas patches or heating patches over the right anterior thorax.  Follow-up instructions: Please follow-up with your primary care provider as soon as you can for further evaluation of your symptoms.   Case management will call you regarding setting of a primary care provider. Please look out for [phone] numbers.  Return instructions:  SEEK IMMEDIATE MEDICAL ATTENTION IF: You have severe chest pain, especially if the pain is crushing or pressure-like and spreads to the arms, back, neck, or jaw, or if you have sweating, nausea (feeling sick to your stomach), or shortness of breath. THIS IS AN EMERGENCY. Don't wait to see if the pain will go away. Get medical help at once. Call 911 or 0 (operator). DO  NOT drive yourself to the hospital.  Your chest pain gets worse and does not go away with rest.  You have an attack of chest pain lasting longer than usual, despite rest and treatment with the medications your caregiver has prescribed.  You wake from sleep with chest pain or shortness of breath. You feel dizzy or faint. You have chest pain not typical of your usual pain for which you originally saw your caregiver.  You have any other emergent concerns regarding your health.  Additional Information: Chest pain comes from many different causes. Your caregiver has diagnosed you as having chest pain that is not specific for one problem, but does not require admission.  You are at low risk for an acute heart condition or other serious illness.   Your vital signs today were: BP 125/71 (BP Location: Left Arm)    Pulse 70    Temp 98.3 F (36.8 C)    Resp 18    Ht 5\' 9"  (1.753 m)    Wt 78.9 kg (174 lb)    SpO2 100%    BMI 25.70 kg/m  If your blood pressure (BP) was elevated above 135/85 this visit, please have this repeated by your doctor within one month. --------------

## 2017-12-23 ENCOUNTER — Emergency Department (HOSPITAL_COMMUNITY)
Admission: EM | Admit: 2017-12-23 | Discharge: 2017-12-23 | Disposition: A | Discharge status: Home / Self Care | Payer: Self-pay | Attending: Emergency Medicine | Admitting: Emergency Medicine

## 2017-12-23 ENCOUNTER — Encounter (HOSPITAL_COMMUNITY): Payer: Self-pay | Admitting: Emergency Medicine

## 2017-12-23 ENCOUNTER — Emergency Department (HOSPITAL_COMMUNITY): Payer: Self-pay

## 2017-12-23 ENCOUNTER — Other Ambulatory Visit: Payer: Self-pay

## 2017-12-23 DIAGNOSIS — M25511 Pain in right shoulder: Secondary | ICD-10-CM | POA: Insufficient documentation

## 2017-12-23 DIAGNOSIS — F17200 Nicotine dependence, unspecified, uncomplicated: Secondary | ICD-10-CM | POA: Insufficient documentation

## 2017-12-23 DIAGNOSIS — Z85528 Personal history of other malignant neoplasm of kidney: Secondary | ICD-10-CM | POA: Insufficient documentation

## 2017-12-23 MED ORDER — PREDNISONE 10 MG (21) PO TBPK: ORAL_TABLET | Freq: Every day | ORAL | 0 refills | Status: DC

## 2017-12-23 MED ORDER — METHOCARBAMOL 750 MG PO TABS: 750.0000 mg | ORAL_TABLET | Freq: Three times a day (TID) | ORAL | 0 refills | Status: DC | PRN

## 2017-12-23 NOTE — Discharge Instructions (Signed)
Please do not take any ibuprofen, Motrin, Aleve, naproxen, Mobic, or meloxicam as these can all cause damage to your kidney.  Please take Tylenol (acetaminophen) to relieve your pain.  You may take tylenol, up to 1,000 mg (two extra strength pills).  Do not take more than 3,000 mg tylenol in a 24 hour period.  Please check all medication labels as many medications such as pain and cold medications may contain tylenol. Please do not drink alcohol while taking this medication.   You are being prescribed a medication which may make you sleepy. For 24 hours after one dose please do not drive, operate heavy machinery, care for a small child with out another adult present, or perform any activities that may cause harm to you or someone else if you were to fall asleep or be impaired.   I have given you a prescription for steroids today.  Some common side effects include feelings of extra energy, feeling warm, increased appetite, and stomach upset.  If you are diabetic your sugars may run higher than usual.

## 2017-12-23 NOTE — ED Triage Notes (Signed)
Pt. Stated, Ive had rt. Shoulder pain for 4 days. Its in constant pain. I do some lifting at work, but no injury.

## 2017-12-23 NOTE — ED Notes (Signed)
ED Provider at bedside. 

## 2017-12-23 NOTE — ED Provider Notes (Signed)
[facility] EMERGENCY DEPARTMENT Provider Note   CSN: [id number] Arrival date & time: 12/23/17  8315     History   Chief Complaint Chief Complaint  Patient presents with  . Shoulder Pain    HPI Joshua Lewis is a 57 y.o. male with a history of renal cell carcinoma status post nephrectomy, who presents today for evaluation of right shoulder pain.  He reports that it is constant and painful.  He reports that it is worse with any movement.  He has been trying low-dose Robaxin at home without significant relief.  He denies any fevers or chills, no redness to the area, he does not have a history of gout.  He has not been taking any Tylenol or ibuprofen.  No chest pain shortness of breath.  Denies numbness or tingling or weakness.  He reports that he works regularly lifts over 70 pounds above his head.  He denies any specific injury.    HPI  Past Medical History:  Diagnosis Date  . Kidney carcinoma ([place])     There are no active problems to display for this patient.   Past Surgical History:  Procedure Laterality Date  . HAND SURGERY    . HERNIA REPAIR    . NEPHRECTOMY     right        Home Medications    Prior to Admission medications   Medication Sig Start Date End Date Taking? Authorizing Provider  methocarbamol (ROBAXIN) 750 MG tablet Take 1-2 tablets (750-1,500 mg total) by mouth 3 (three) times daily as needed for muscle spasms. 12/23/17   Lorin Glass, PA-C  predniSONE (STERAPRED UNI-PAK 21 TAB) 10 MG (21) TBPK tablet Take by mouth daily. Take 6 tabs by mouth daily  for 1 day, then 5 tabs for 1 day, then 4 tabs for 1 day, then 3 tabs for 1 day, 2 tabs for 1 day, then 1 tab by mouth daily for 1 day 12/23/17   Lorin Glass, PA-C  Ibuprofen-Diphenhydramine HCl (ADVIL PM) 200-25 MG CAPS Take 1 tablet by mouth at bedtime. To help sleep  11/25/11  [provider]    Family History Family History  Problem Relation Age of Onset  .  Cancer Mother   . Diabetes Mother   . Hypertension Mother     Social History Social History   Tobacco Use  . Smoking status: Current Some Day Smoker    Packs/day: 0.50  . Smokeless tobacco: Never Used  Substance Use Topics  . Alcohol use: No  . Drug use: No     Allergies   Patient has no known allergies.   Review of Systems Review of Systems  Constitutional: Negative for chills and fever.  Musculoskeletal:       Right shoulder pain.     Physical Exam Updated Vital Signs BP 129/88 (BP Location: Left Arm)   Pulse 66   Temp 98 F (36.7 C) (Oral)   Resp 14   Ht 5\' 9"  (1.753 m)   Wt 77.1 kg (170 lb)   SpO2 97%   BMI 25.10 kg/m   Physical Exam  Constitutional: He appears well-developed and well-nourished.  HENT:  Head: Normocephalic and atraumatic.  Cardiovascular:  2+ right radial pulse.  Right hand is warm and well perfused.  Musculoskeletal: He exhibits no deformity.  5/5 right grip strength.  Shoulder is diffusely tender to palpation along the anterior superior and posterior aspects of the joint.  Minimal surrounding muscle tightness and tenderness  to palpation.  Pain is limiting range of motion.  Wrist has full range of motion.  Elbow has full range of motion without pain.    Neurological:  Sensation intact to right upper extremity.  Skin: Skin is warm and dry. He is not diaphoretic.  No obvious redness, fluctuance, edema or warmth over right shoulder joint.  Nursing note and vitals reviewed.    ED Treatments / Results  Labs (all labs ordered are listed, but only abnormal results are displayed) Labs Reviewed - No data to display  EKG None  Radiology Dg Shoulder Right  Result Date: 12/23/2017 CLINICAL DATA:  Shoulder pain for 4 days.  No known injury. EXAM: RIGHT SHOULDER - 2+ VIEW COMPARISON:  None. FINDINGS: There is no evidence of fracture or dislocation. There is no evidence of arthropathy or other focal bone abnormality. Soft tissues are  unremarkable. IMPRESSION: Negative. Electronically Signed   By: Franki Cabot M.D.   On: 12/23/2017 09:41    Procedures Procedures (including critical care time)  Medications Ordered in ED Medications - No data to display   Initial Impression / Assessment and Plan / ED Course  I have reviewed the triage vital signs and the nursing notes.  Pertinent labs & imaging results that were available during my care of the patient were reviewed by me and considered in my medical decision making (see chart for details).    Patient presents today for evaluation of atraumatic right shoulder pain.  He has a history of repetitive lifting of heavy weights over his head at work.  Not suspicious for septic arthritis or gout based on history and physical exam.  Suspect impingement along with repetitive/overuse.  X-rays are obtained without evidence of deformity or arthritis.  Patient is unable to take NSAIDs due to having only one kidney.  He was given prescription for Robaxin with more appropriate dosing, along with steroids.  He denies any history of diabetes.  He was informed that he needs to monitor himself for polydipsia or polyuria and if he develops these to stop the steroids and get additional medical evaluation.  Patient given ortho follow up and discharged home.    Final Clinical Impressions(s) / ED Diagnoses   Final diagnoses:  Acute pain of right shoulder    ED Discharge Orders        Ordered    methocarbamol (ROBAXIN) 750 MG tablet  3 times daily PRN     12/23/17 1140    predniSONE (STERAPRED UNI-PAK 21 TAB) 10 MG (21) TBPK tablet  Daily     12/23/17 1140       Ollen Gross 12/23/17 1206    Carmin Muskrat, MD 12/23/17 1559

## 2018-02-22 ENCOUNTER — Encounter (HOSPITAL_BASED_OUTPATIENT_CLINIC_OR_DEPARTMENT_OTHER): Payer: Self-pay | Admitting: Adult Health

## 2018-02-22 ENCOUNTER — Emergency Department (HOSPITAL_BASED_OUTPATIENT_CLINIC_OR_DEPARTMENT_OTHER)
Admission: EM | Admit: 2018-02-22 | Discharge: 2018-02-22 | Disposition: A | Discharge status: Home / Self Care | Payer: Self-pay | Attending: Emergency Medicine | Admitting: Emergency Medicine

## 2018-02-22 ENCOUNTER — Other Ambulatory Visit: Payer: Self-pay

## 2018-02-22 DIAGNOSIS — K029 Dental caries, unspecified: Secondary | ICD-10-CM | POA: Insufficient documentation

## 2018-02-22 DIAGNOSIS — Z85528 Personal history of other malignant neoplasm of kidney: Secondary | ICD-10-CM | POA: Insufficient documentation

## 2018-02-22 DIAGNOSIS — F17210 Nicotine dependence, cigarettes, uncomplicated: Secondary | ICD-10-CM | POA: Insufficient documentation

## 2018-02-22 DIAGNOSIS — K0889 Other specified disorders of teeth and supporting structures: Secondary | ICD-10-CM

## 2018-02-22 DIAGNOSIS — K047 Periapical abscess without sinus: Secondary | ICD-10-CM | POA: Insufficient documentation

## 2018-02-22 MED ORDER — AMOXICILLIN-POT CLAVULANATE 875-125 MG PO TABS: 1.0000 | ORAL_TABLET | Freq: Two times a day (BID) | ORAL | 0 refills | Status: AC

## 2018-02-22 NOTE — ED Triage Notes (Signed)
Presents with LEft lower tooth infection. He has been unable to see a dentist for it.

## 2018-02-22 NOTE — ED Provider Notes (Signed)
[place] EMERGENCY DEPARTMENT Provider Note   CSN: [id number] Arrival date & time: 02/22/18  1820     History   Chief Complaint Chief Complaint  Patient presents with  . Dental Pain    HPI Joshua Lewis is a 57 y.o. male.  HPI   Patient is a 57 year old male who presents the emergency department today complaining of left lower dental pain that has been ongoing for the last 4 days.  States pain is constant and severe in nature.  He is tried taking over-the-counter medications at home for his symptoms but has had no significant relief.  Denies any fevers, chills, sweats.  Has had no difficulty drinking liquids and eating solid foods at home.  No sore throat.  No rhinorrhea, nasal congestion, ear pain.  Does have a mild headache because of pain.  Denies any swelling to the neck.  Does endorse some swelling to the left cheek.  Does not have a dentist.  Past Medical History:  Diagnosis Date  . Kidney carcinoma ([place])     There are no active problems to display for this patient.   Past Surgical History:  Procedure Laterality Date  . HAND SURGERY    . HERNIA REPAIR    . NEPHRECTOMY     right        Home Medications    Prior to Admission medications   Medication Sig Start Date End Date Taking? Authorizing Provider  amoxicillin-clavulanate (AUGMENTIN) 875-125 MG tablet Take 1 tablet by mouth every 12 (twelve) hours for 10 days. 02/22/18 03/04/18  Gabriell Casimir S, PA-C  methocarbamol (ROBAXIN) 750 MG tablet Take 1-2 tablets (750-1,500 mg total) by mouth 3 (three) times daily as needed for muscle spasms. 12/23/17   Lorin Glass, PA-C  predniSONE (STERAPRED UNI-PAK 21 TAB) 10 MG (21) TBPK tablet Take by mouth daily. Take 6 tabs by mouth daily  for 1 day, then 5 tabs for 1 day, then 4 tabs for 1 day, then 3 tabs for 1 day, 2 tabs for 1 day, then 1 tab by mouth daily for 1 day 12/23/17   Lorin Glass, PA-C  Ibuprofen-Diphenhydramine HCl (ADVIL PM) 200-25 MG  CAPS Take 1 tablet by mouth at bedtime. To help sleep  11/25/11  [provider]    Family History Family History  Problem Relation Age of Onset  . Cancer Mother   . Diabetes Mother   . Hypertension Mother     Social History Social History   Tobacco Use  . Smoking status: Current Some Day Smoker    Packs/day: 0.50  . Smokeless tobacco: Never Used  Substance Use Topics  . Alcohol use: No  . Drug use: No     Allergies   Patient has no known allergies.   Review of Systems Review of Systems  Constitutional: Negative for chills, diaphoresis and fever.  HENT: Positive for dental problem. Negative for congestion, rhinorrhea and sore throat.   Respiratory: Negative for shortness of breath.   Cardiovascular: Negative for chest pain.  Gastrointestinal: Negative for abdominal pain, constipation, diarrhea, nausea and vomiting.  Musculoskeletal: Negative for back pain.  Skin: Negative for wound.  Neurological: Positive for headaches. Negative for dizziness, weakness, light-headedness and numbness.    Physical Exam Updated Vital Signs BP 125/88   Pulse 74   Temp 98.6 F (37 C) (Oral)   Resp 18   SpO2 100%   Physical Exam  Constitutional: He is oriented to person, place, and time. He appears  well-developed and well-nourished. No distress.  HENT:  Head: Normocephalic and atraumatic.  Right Ear: External ear normal.  Left Ear: External ear normal.  Bilateral TMs without erythema or effusion.  No pharyngeal erythema.  No tonsillar swelling or exudates.  Uvula midline.  Tolerating secretions.  Diffuse dental caries.  There is small area of fluctuance to lower left gums just adjacent to tooth # 18 and 19, tooth #18 cracked.  No swelling beneath the tongue no tenderness beneath the tongue.  Eyes: Pupils are equal, round, and reactive to light. Conjunctivae and EOM are normal.  Neck: Normal range of motion. Neck supple.  Cardiovascular: Normal rate, regular rhythm and  normal heart sounds.  Pulmonary/Chest: Effort normal and breath sounds normal. He has no wheezes.  Musculoskeletal: Normal range of motion.  Lymphadenopathy:    He has no cervical adenopathy.  Neurological: He is alert and oriented to person, place, and time. No cranial nerve deficit.  Skin: Skin is warm and dry. Capillary refill takes less than 2 seconds.  Psychiatric: He has a normal mood and affect.     ED Treatments / Results  Labs (all labs ordered are listed, but only abnormal results are displayed) Labs Reviewed - No data to display  EKG None  Radiology No results found.  Procedures Procedures (including critical care time)  Medications Ordered in ED Medications - No data to display   Initial Impression / Assessment and Plan / ED Course  I have reviewed the triage vital signs and the nursing notes.  Pertinent labs & imaging results that were available during my care of the patient were reviewed by me and considered in my medical decision making (see chart for details).     Final Clinical Impressions(s) / ED Diagnoses   Final diagnoses:  Pain, dental   Patient with toothache. Small abscess adjacent to the gums. Multiple dental caries.  Exam unconcerning for Ludwig's angina or spread of infection.  Will treat with Augmentin and pain medicine.  Urged patient to follow-up with dentist.  Resources given.  Strict return precautions discussed.  Patient voiced understanding the plan is to return immediately to the ED.  All questions answered.   ED Discharge Orders        Ordered    amoxicillin-clavulanate (AUGMENTIN) 875-125 MG tablet  Every 12 hours     02/22/18 1918       Bishop Dublin 02/22/18 1919    Dorie Rank, MD 02/23/18 1511

## 2018-02-22 NOTE — Discharge Instructions (Signed)
You were given a prescription for antibiotics. Please take the antibiotic prescription fully.   I have prescribed a new medication for you today. It is important that when you pick the prescription up you discuss the potential interactions of this medication with other medications you are taking, including over the counter medications, with the pharmacists.   This new medication has potential side effects. Be sure to contact your primary care provider or return to the emergency department if you are experiencing new symptoms that you are unable to tolerate after starting the medication. You need to receive medical evaluation immediately if you start to experience blistering of the skin, rash, swelling, or difficulty breathing as these signs could indicate a more serious medication side effect.   Please follow up with your primary care provider within 5-7 days for re-evaluation of your symptoms. If you do not have a primary care provider, information for a healthcare clinic has been provided for you to make arrangements for follow up care. Please return to the emergency department for any new or worsening symptoms.  You were given resources to follow-up with a dentist in the area.  Please follow-up with a dentist as discussed.

## 2018-04-12 ENCOUNTER — Encounter (HOSPITAL_BASED_OUTPATIENT_CLINIC_OR_DEPARTMENT_OTHER): Payer: Self-pay

## 2018-04-12 ENCOUNTER — Emergency Department (HOSPITAL_BASED_OUTPATIENT_CLINIC_OR_DEPARTMENT_OTHER): Payer: Self-pay

## 2018-04-12 ENCOUNTER — Other Ambulatory Visit: Payer: Self-pay

## 2018-04-12 ENCOUNTER — Emergency Department (HOSPITAL_BASED_OUTPATIENT_CLINIC_OR_DEPARTMENT_OTHER)
Admission: EM | Admit: 2018-04-12 | Discharge: 2018-04-12 | Disposition: A | Discharge status: Home / Self Care | Payer: Self-pay | Attending: Emergency Medicine | Admitting: Emergency Medicine

## 2018-04-12 DIAGNOSIS — M25462 Effusion, left knee: Secondary | ICD-10-CM | POA: Insufficient documentation

## 2018-04-12 DIAGNOSIS — F17200 Nicotine dependence, unspecified, uncomplicated: Secondary | ICD-10-CM | POA: Insufficient documentation

## 2018-04-12 HISTORY — DX: Dorsalgia, unspecified: M54.9

## 2018-04-12 HISTORY — DX: Other chronic pain: G89.29

## 2018-04-12 LAB — SYNOVIAL CELL COUNT + DIFF, W/ CRYSTALS
Crystals, Fluid: NONE SEEN
Eosinophils-Synovial: 0 % (ref 0–1)
Lymphocytes-Synovial Fld: 6 % (ref 0–20)
Monocyte-Macrophage-Synovial Fluid: 72 % (ref 50–90)
Neutrophil, Synovial: 22 % (ref 0–25)
WBC, Synovial: 115 /mm3 (ref 0–200)

## 2018-04-12 LAB — GRAM STAIN

## 2018-04-12 LAB — CBG MONITORING, ED: Glucose-Capillary: 97 mg/dL (ref 70–99)

## 2018-04-12 MED ORDER — LIDOCAINE HCL (PF) 1 % IJ SOLN
5.0000 mL | Freq: Once | INTRAMUSCULAR | Status: AC
  Administered 2018-04-12: 5 mL via SUBCUTANEOUS
  Filled 2018-04-12: qty 5

## 2018-04-12 MED ORDER — PENTAFLUOROPROP-TETRAFLUOROETH EX AERO
INHALATION_SPRAY | Freq: Once | CUTANEOUS | Status: AC
  Administered 2018-04-12: 13:00:00 via TOPICAL

## 2018-04-12 MED ORDER — PENTAFLUOROPROP-TETRAFLUOROETH EX AERO
INHALATION_SPRAY | CUTANEOUS | Status: AC
  Filled 2018-04-12: qty 30

## 2018-04-12 MED ORDER — PENTAFLUOROPROP-TETRAFLUOROETH EX AERO: INHALATION_SPRAY | Freq: Once | CUTANEOUS | Status: DC

## 2018-04-12 NOTE — ED Provider Notes (Signed)
[place] EMERGENCY DEPARTMENT Provider Note   CSN: [id number] Arrival date & time: 04/12/18  1135     History   Chief Complaint Chief Complaint  Patient presents with  . Leg Swelling    HPI Joshua Lewis is a 57 y.o. male with a past medical history of renal cell carcinoma who presents to the ED with a 3- week history of constant knee pain and knee swelling. History is obtained by the patient. Denies injury to knee. State is pain is located to the medial and superior portion of the knee. Pain is worse with movement, walking and climbing stairs. Denies any alleviation of symptoms with Tylenol, NSAIDs, ice and heat. State his pain is an 8/10 and described it as throbbing. Denies radiation of pain, fever, heat, redness to the knee or history of gout. States his pain is starting to affect his job as he climbs stairs frequently.   HPI  Past Medical History:  Diagnosis Date  . Chronic back pain greater than 3 months duration   . Kidney carcinoma ([place])     There are no active problems to display for this patient.   Past Surgical History:  Procedure Laterality Date  . HAND SURGERY    . HERNIA REPAIR    . NEPHRECTOMY     right        Home Medications    Prior to Admission medications   Medication Sig Start Date End Date Taking? Authorizing Provider  Ibuprofen-Diphenhydramine HCl (ADVIL PM) 200-25 MG CAPS Take 1 tablet by mouth at bedtime. To help sleep  11/25/11  [provider]    Family History Family History  Problem Relation Age of Onset  . Cancer Mother   . Diabetes Mother   . Hypertension Mother     Social History Social History   Tobacco Use  . Smoking status: Current Some Day Smoker    Packs/day: 0.50  . Smokeless tobacco: Never Used  Substance Use Topics  . Alcohol use: No  . Drug use: No     Allergies   Patient has no known allergies.   Review of Systems Review of Systems  Constitutional: Negative for chills and fever.    Denies IV drug use  Respiratory: Negative for chest tightness and shortness of breath.   Cardiovascular: Negative for chest pain, palpitations and leg swelling.  Genitourinary:       Denies history of STIs  Musculoskeletal: Negative for back pain.       Knee pain most prominent to the medial and superior portion of the left knee. Denies erythema or warmth to knee.  Skin: Negative for rash and wound.     Physical Exam Updated Vital Signs BP 126/78 (BP Location: Right Arm)   Pulse 71   Temp 98 F (36.7 C) (Oral)   Resp 16   Ht 5\' 9"  (1.753 m)   Wt 75.8 kg (167 lb)   SpO2 100%   BMI 24.66 kg/m   Physical Exam  Constitutional: He is oriented to person, place, and time. He appears well-developed and well-nourished. No distress.  HENT:  Head: Normocephalic and atraumatic.  Eyes: Pupils are equal, round, and reactive to light.  Neck: Normal range of motion.  Cardiovascular: Normal rate, regular rhythm, normal heart sounds and intact distal pulses.  No murmur heard. Pulmonary/Chest: Effort normal and breath sounds normal. He exhibits no tenderness.  Abdominal: Soft. Bowel sounds are normal.  Musculoskeletal: He exhibits edema and tenderness.  Edema to the medial  and superior portion of the left knee. No erythema or warmth to the knee. Moderate joint effusion of left knee. Limited left knee ROM with flexion secondary to pain.  Neurological: He is alert and oriented to person, place, and time.  Skin: Skin is warm and dry. No rash noted. No erythema.  Psychiatric: He has a normal mood and affect.     ED Treatments / Results  Labs (all labs ordered are listed, but only abnormal results are displayed) Labs Reviewed  SYNOVIAL CELL COUNT + DIFF, W/ CRYSTALS - Abnormal; Notable for the following components:      Result Value   Color, Synovial AMBER (*)    Appearance-Synovial CLOUDY (*)    All other components within normal limits  GRAM STAIN  CULTURE, BODY FLUID-BOTTLE  GLUCOSE,  BODY FLUID OTHER  CBG MONITORING, ED    EKG None  Radiology Dg Knee Complete 4 Views Left  Result Date: 04/12/2018 CLINICAL DATA:  57 year old male with left knee pain and swelling for 3 weeks with no known injury. Anterior patella region pain. EXAM: LEFT KNEE - COMPLETE 4+ VIEW COMPARISON:  None. FINDINGS: Bone mineralization is within normal limits. Joint spaces and alignment appear preserved. There is a moderate size suprapatellar joint effusion. The patella appears intact. No osseous abnormality identified. IMPRESSION: Moderate-sized joint effusion.  No osseous abnormality identified. Electronically Signed   By: Genevie Ann M.D.   On: 04/12/2018 12:42    Procedures .Joint Aspiration/Arthrocentesis Date/Time: 04/12/2018 6:04 PM Performed by: Nettie Elm, PA-C Authorized by: Nettie Elm, PA-C   Consent:    Consent obtained:  Verbal   Consent given by:  Patient   Risks discussed:  Bleeding, infection, pain, incomplete drainage, nerve damage and poor cosmetic result   Alternatives discussed:  Referral, observation, no treatment, delayed treatment and alternative treatment Universal protocol:    Procedure explained and questions answered to patient or proxy's satisfaction: yes     Relevant documents present and verified: yes     Test results available and properly labeled: yes     Imaging studies available: yes     Required blood products, implants, devices, and special equipment available: yes     Site/side marked: yes     Immediately prior to procedure, a time out was called: yes     Patient identity confirmed:  Verbally with patient and arm band Location:    Location:  Knee Anesthesia (see MAR for exact dosages):    Anesthesia method:  Local infiltration   Local anesthetic:  Lidocaine 1% w/o epi Procedure details:    Preparation: Patient was prepped and draped in usual sterile fashion     Needle gauge:  18 G   Ultrasound guidance: no     Approach:   Lateral   Aspirate amount:  22cc   Aspirate characteristics:  Clear and blood-tinged   Steroid injected: no     Specimen collected: yes   Post-procedure details:    Dressing:  Adhesive bandage   Patient tolerance of procedure:  Tolerated well, no immediate complications   (including critical care time)  Medications Ordered in ED Medications  lidocaine (PF) (XYLOCAINE) 1 % injection 5 mL (5 mLs Subcutaneous Given 04/12/18 1320)  pentafluoroprop-tetrafluoroeth (GEBAUERS) aerosol ( Topical Given by Other 04/12/18 1315)     Initial Impression / Assessment and Plan / ED Course  I have reviewed the triage vital signs and the nursing notes.  Pertinent labs & imaging results that were available during my care  of the patient were reviewed by me and considered in my medical decision making (see chart for details).  57 year old male presents with 3 week history of nontraumatic knee pain. Emergent knee pain differential includes gout, septic bursitis, septic joint, bursitis, tendinitis, joint effusion and bakers cyst. Patients plain film showed moderate joint effusion. Will send arthrocentesis of left knee. Patient voiced understanding of possible risk/ benefits of procedure not limited to infection, pain, damage to surrounding tissues including nerves and bleeding.   Patient tolerated procedure well and voiced understanding that the collected fluid will be sent for analysis. Patient voiced decreased pain and increased ROM in left knee after arthrocentesis. Low suspicion for septic joint, gout or septic bursitis given symptoms and negative arthrocentesis.   Patient suitable for discharge and will refer patient to Orthopedics for follow up. Advised patient to limit his use of NSAIDs considering his previous nephrectomy. Discussed with patient return precautions and he voices his understanding.     Final Clinical Impressions(s) / ED Diagnoses   Final diagnoses:  Knee effusion, left    ED Discharge  Orders        Ordered    Knee brace  Status:  Canceled     04/12/18 1726       Hermena Swint A, PA-C 04/12/18 1945    Malvin Johns, MD 04/13/18 [phone]

## 2018-04-12 NOTE — ED Notes (Signed)
Left knee drained per PA using sterile technique and specs to lab

## 2018-04-12 NOTE — ED Notes (Signed)
EDP updated pt of results and discharge instructions and pt walked out without waiting for AVS summary.

## 2018-04-12 NOTE — ED Notes (Signed)
Pt returned and stated he just went outside to make a phone call. This RN provided pt with AVS and reviewed d/c instructions with pt.

## 2018-04-12 NOTE — ED Triage Notes (Signed)
C/o pain and swelling to lrft LE x 3 weeks-denies injury-to triage in w/c-NAD

## 2018-04-12 NOTE — ED Notes (Signed)
Patient transported to X-ray 

## 2018-04-12 NOTE — Discharge Instructions (Addendum)
Follow up with Orthopedics in 1 week for re-evaluation.   Contact a doctor if: You continue to have pain in your knee. Get help right away if: You have increased swelling or redness of your knee. You have severe pain in your knee. You have a fever.

## 2018-04-16 LAB — GLUCOSE, BODY FLUID OTHER: Glucose, Body Fluid Other: 97 mg/dL

## 2018-04-17 LAB — CULTURE, BODY FLUID W GRAM STAIN -BOTTLE: Culture: NO GROWTH

## 2018-04-17 LAB — CULTURE, BODY FLUID-BOTTLE

## 2018-09-13 ENCOUNTER — Emergency Department (HOSPITAL_COMMUNITY)
Admission: EM | Admit: 2018-09-13 | Discharge: 2018-09-13 | Disposition: A | Discharge status: Home / Self Care | Payer: Self-pay | Attending: Emergency Medicine | Admitting: Emergency Medicine

## 2018-09-13 ENCOUNTER — Emergency Department (HOSPITAL_COMMUNITY): Payer: Self-pay

## 2018-09-13 ENCOUNTER — Encounter (HOSPITAL_COMMUNITY): Payer: Self-pay | Admitting: *Deleted

## 2018-09-13 DIAGNOSIS — Y939 Activity, unspecified: Secondary | ICD-10-CM | POA: Insufficient documentation

## 2018-09-13 DIAGNOSIS — R52 Pain, unspecified: Secondary | ICD-10-CM

## 2018-09-13 DIAGNOSIS — R079 Chest pain, unspecified: Secondary | ICD-10-CM | POA: Insufficient documentation

## 2018-09-13 DIAGNOSIS — F17200 Nicotine dependence, unspecified, uncomplicated: Secondary | ICD-10-CM | POA: Insufficient documentation

## 2018-09-13 DIAGNOSIS — S46912A Strain of unspecified muscle, fascia and tendon at shoulder and upper arm level, left arm, initial encounter: Secondary | ICD-10-CM | POA: Insufficient documentation

## 2018-09-13 DIAGNOSIS — Z85528 Personal history of other malignant neoplasm of kidney: Secondary | ICD-10-CM | POA: Insufficient documentation

## 2018-09-13 DIAGNOSIS — Y929 Unspecified place or not applicable: Secondary | ICD-10-CM | POA: Insufficient documentation

## 2018-09-13 DIAGNOSIS — Z79899 Other long term (current) drug therapy: Secondary | ICD-10-CM | POA: Insufficient documentation

## 2018-09-13 DIAGNOSIS — Y999 Unspecified external cause status: Secondary | ICD-10-CM | POA: Insufficient documentation

## 2018-09-13 DIAGNOSIS — X58XXXA Exposure to other specified factors, initial encounter: Secondary | ICD-10-CM | POA: Insufficient documentation

## 2018-09-13 LAB — BASIC METABOLIC PANEL
Anion gap: 10 (ref 5–15)
BUN: 6 mg/dL (ref 6–20)
CO2: 22 mmol/L (ref 22–32)
Calcium: 9.4 mg/dL (ref 8.9–10.3)
Chloride: 106 mmol/L (ref 98–111)
Creatinine, Ser: 1.15 mg/dL (ref 0.61–1.24)
GFR calc Af Amer: >60 mL/min (ref >60)
GFR calc non Af Amer: >60 mL/min (ref >60)
Glucose, Bld: 102 mg/dL — ABNORMAL HIGH (ref 70–99)
Potassium: 4.2 mmol/L (ref 3.5–5.1)
Sodium: 138 mmol/L (ref 135–145)

## 2018-09-13 LAB — CBC
HCT: 43.7 % (ref 39.0–52.0)
Hemoglobin: 14.1 g/dL (ref 13.0–17.0)
MCH: 28.8 pg (ref 26.0–34.0)
MCHC: 32.3 g/dL (ref 30.0–36.0)
MCV: 89.2 fL (ref 80.0–100.0)
Platelets: 305 10*3/uL (ref 150–400)
RBC: 4.9 MIL/uL (ref 4.22–5.81)
RDW: 13.3 % (ref 11.5–15.5)
WBC: 15 10*3/uL — ABNORMAL HIGH (ref 4.0–10.5)
nRBC: 0 % (ref 0.0–0.2)

## 2018-09-13 LAB — I-STAT TROPONIN, ED: Troponin i, poc: 0 ng/mL (ref 0.00–0.08)

## 2018-09-13 MED ORDER — HYDROCODONE-ACETAMINOPHEN 5-325 MG PO TABS: 1.0000 | ORAL_TABLET | Freq: Four times a day (QID) | ORAL | 0 refills | Status: DC | PRN

## 2018-09-13 MED ORDER — CYCLOBENZAPRINE HCL 10 MG PO TABS
10.0000 mg | ORAL_TABLET | Freq: Once | ORAL | Status: AC
  Administered 2018-09-13: 10 mg via ORAL
  Filled 2018-09-13: qty 1

## 2018-09-13 MED ORDER — IBUPROFEN 600 MG PO TABS: 600.0000 mg | ORAL_TABLET | Freq: Three times a day (TID) | ORAL | 0 refills | Status: DC | PRN

## 2018-09-13 MED ORDER — MORPHINE SULFATE (PF) 4 MG/ML IV SOLN
4.0000 mg | Freq: Once | INTRAVENOUS | Status: AC
  Administered 2018-09-13: 4 mg via INTRAVENOUS
  Filled 2018-09-13: qty 1

## 2018-09-13 MED ORDER — IBUPROFEN 800 MG PO TABS
800.0000 mg | ORAL_TABLET | Freq: Once | ORAL | Status: AC
  Administered 2018-09-13: 800 mg via ORAL
  Filled 2018-09-13: qty 1

## 2018-09-13 MED ORDER — METHOCARBAMOL 500 MG PO TABS: 500.0000 mg | ORAL_TABLET | Freq: Two times a day (BID) | ORAL | 0 refills | Status: DC

## 2018-09-13 NOTE — ED Notes (Signed)
Pt alert and oriented in NAD. Pt verbalized understanding of discharge instructions. 

## 2018-09-13 NOTE — ED Provider Notes (Signed)
[place] EMERGENCY DEPARTMENT Provider Note   CSN: [id number] Arrival date & time: 09/13/18  1003     History   Chief Complaint Chief Complaint  Patient presents with  . Chest Pain    HPI Joshua Lewis is a 58 y.o. male.  The history is provided by the patient. No language interpreter was used.  Chest Pain     Joshua Lewis is a 58 y.o. male who presents to the Emergency Department complaining of shoulder pain. He presents to the emergency department complaining of left shoulder pain that developed two days ago. Pain is located in the left shoulder posteriorly and radiates to the left anterior chest, left side of the neck and to his left upper arm. Pain is constant in nature and worse with movement. He is right-hand dominant and does a lot of heavy lifting for work. He denies any fevers, shortness of breath, nausea, vomiting, diarrhea, numbness, weakness. He has no midline neck pain. He does have a history of renal carcinoma, status post resection. He does not smoke, drink alcohol or use street drugs. Past Medical History:  Diagnosis Date  . Chronic back pain greater than 3 months duration   . Kidney carcinoma ([place])     There are no active problems to display for this patient.   Past Surgical History:  Procedure Laterality Date  . HAND SURGERY    . HERNIA REPAIR    . NEPHRECTOMY     right        Home Medications    Prior to Admission medications   Medication Sig Start Date End Date Taking? Authorizing Provider  acetaminophen (TYLENOL) 500 MG tablet Take 1,000 mg by mouth every 6 (six) hours as needed for mild pain or headache.   Yes [provider]  aspirin 325 MG EC tablet Take 325 mg by mouth daily as needed for pain.   Yes [provider]  Ibuprofen-Diphenhydramine HCl (ADVIL PM) 200-25 MG CAPS Take 1 tablet by mouth at bedtime. To help sleep  11/25/11  [provider]    Family History Family History  Problem Relation  Age of Onset  . Cancer Mother   . Diabetes Mother   . Hypertension Mother     Social History Social History   Tobacco Use  . Smoking status: Current Some Day Smoker    Packs/day: 0.50  . Smokeless tobacco: Never Used  Substance Use Topics  . Alcohol use: No  . Drug use: No     Allergies   Patient has no known allergies.   Review of Systems Review of Systems  Cardiovascular: Positive for chest pain.  All other systems reviewed and are negative.    Physical Exam Updated Vital Signs BP 128/80   Pulse 77   Temp 97.9 F (36.6 C) (Oral)   Resp 14   SpO2 98%   Physical Exam Vitals signs and nursing note reviewed.  Constitutional:      Appearance: He is well-developed.  HENT:     Head: Normocephalic and atraumatic.  Cardiovascular:     Rate and Rhythm: Normal rate and regular rhythm.     Heart sounds: No murmur.  Pulmonary:     Effort: Pulmonary effort is normal. No respiratory distress.     Breath sounds: Normal breath sounds.  Abdominal:     Palpations: Abdomen is soft.     Tenderness: There is no abdominal tenderness. There is no guarding or rebound.  Musculoskeletal:  General: No tenderness.     Comments: 2+ radial pulses bilaterally. There is tenderness to palpation over the deltoid region of the left shoulder, greatest over the posterior aspect. There is decreased range of motion throughout the left shoulder secondary to pain.  Skin:    General: Skin is warm and dry.     Capillary Refill: Capillary refill takes less than 2 seconds.  Neurological:     Mental Status: He is alert and oriented to person, place, and time.     Comments: Five out of five grip strength bilaterally. Sensation to light touch intact throughout the left upper extremity. Five out of five strength and bilateral lower extremities. Unable to test proximal upper extremity strength secondary to pain on strength testing.  Psychiatric:        Mood and Affect: Mood normal.         Behavior: Behavior normal.      ED Treatments / Results  Labs (all labs ordered are listed, but only abnormal results are displayed) Labs Reviewed  BASIC METABOLIC PANEL - Abnormal; Notable for the following components:      Result Value   Glucose, Bld 102 (*)    All other components within normal limits  CBC - Abnormal; Notable for the following components:   WBC 15.0 (*)    All other components within normal limits  I-STAT TROPONIN, ED    EKG EKG Interpretation  Date/Time:  Friday September 13 2018 10:20:36 EST Ventricular Rate:  95 PR Interval:  132 QRS Duration: 80 QT Interval:  330 QTC Calculation: 414 R Axis:   80 Text Interpretation:  Normal sinus rhythm Right atrial enlargement Borderline ECG Confirmed by Quintella Reichert [phone]) on 09/13/2018 2:01:11 PM   Radiology Dg Chest 2 View  Result Date: 09/13/2018 CLINICAL DATA:  Chest pain and left arm pain. EXAM: CHEST - 2 VIEW COMPARISON:  10/23/2017 FINDINGS: The heart size and mediastinal contours are within normal limits. Both lungs are clear. The visualized skeletal structures are unremarkable. IMPRESSION: No active cardiopulmonary disease. Electronically Signed   By: Lorriane Shire M.D.   On: 09/13/2018 10:44   Dg Shoulder Left  Result Date: 09/13/2018 CLINICAL DATA:  Left shoulder pain. EXAM: LEFT SHOULDER - 2+ VIEW COMPARISON:  No recent prior. FINDINGS: Acromioclavicular and glenohumeral degenerative change. No evidence of fracture dislocation. IMPRESSION: Acromioclavicular glenohumeral degenerative change. No acute bony abnormality. Electronically Signed   By: Marcello Moores  Register   On: 09/13/2018 11:03    Procedures Procedures (including critical care time)  Medications Ordered in ED Medications  morphine 4 MG/ML injection 4 mg (has no administration in time range)  cyclobenzaprine (FLEXERIL) tablet 10 mg (has no administration in time range)  ibuprofen (ADVIL,MOTRIN) tablet 800 mg (has no administration in time  range)     Initial Impression / Assessment and Plan / ED Course  I have reviewed the triage vital signs and the nursing notes.  Pertinent labs & imaging results that were available during my care of the patient were reviewed by me and considered in my medical decision making (see chart for details).     Patient here for evaluation of posterior left shoulder pain. Pain is very reproducible on range of motion. Presentation is not consistent with ACS, PE, dissection, epidural abscess, septic arthritis. Labs demonstrate leukocytosis, likely secondary to team origination from pain. Discussed with patient home care for musculoskeletal pain. Discussed importance of PCP follow-up as well as return precautions.  Final Clinical Impressions(s) / ED Diagnoses  Final diagnoses:  Pain    ED Discharge Orders    None       Quintella Reichert, MD 09/13/18 1625

## 2018-09-13 NOTE — ED Triage Notes (Addendum)
Pt in via EMS to triage c/o chest pain since yesterday morning, left sided chest pressure that is radiating into his left shoulder and neck, denies n/v or dizziness, pt reports chest pain is improved at this time but shoulder pain is continued and is worse with movement- pt given nitro x2 by EMS and chest pain improved

## 2018-09-14 ENCOUNTER — Encounter (HOSPITAL_COMMUNITY): Payer: Self-pay

## 2018-09-14 ENCOUNTER — Emergency Department (HOSPITAL_COMMUNITY): Admission: EM | Admit: 2018-09-14 | Discharge: 2018-09-15 | Discharge status: Against Medical Advice | Payer: Self-pay

## 2018-09-14 ENCOUNTER — Other Ambulatory Visit: Payer: Self-pay

## 2018-09-14 NOTE — ED Triage Notes (Signed)
Pt arrives from home via [facility]. Pt seen at [facility] yesterday for left shoulder and neck pain. Pt diagnosed with sprain. Pt prescribed oxycodone, pt reports no relief. Pt denies any new injury. Pt reports that he is still just in pain. EMS gave 113mcg fentanyl IV.

## 2018-09-14 NOTE — ED Notes (Signed)
Called pt in lobby w/o a response, twice

## 2019-03-27 ENCOUNTER — Emergency Department (HOSPITAL_COMMUNITY)
Admission: EM | Admit: 2019-03-27 | Discharge: 2019-03-28 | Disposition: A | Discharge status: Home / Self Care | Payer: Self-pay | Attending: Emergency Medicine | Admitting: Emergency Medicine

## 2019-03-27 ENCOUNTER — Other Ambulatory Visit: Payer: Self-pay

## 2019-03-27 DIAGNOSIS — Z7982 Long term (current) use of aspirin: Secondary | ICD-10-CM | POA: Insufficient documentation

## 2019-03-27 DIAGNOSIS — T7840XA Allergy, unspecified, initial encounter: Secondary | ICD-10-CM | POA: Insufficient documentation

## 2019-03-27 DIAGNOSIS — M7989 Other specified soft tissue disorders: Secondary | ICD-10-CM | POA: Insufficient documentation

## 2019-03-27 DIAGNOSIS — F17210 Nicotine dependence, cigarettes, uncomplicated: Secondary | ICD-10-CM | POA: Insufficient documentation

## 2019-03-27 DIAGNOSIS — Z79899 Other long term (current) drug therapy: Secondary | ICD-10-CM | POA: Insufficient documentation

## 2019-03-27 MED ORDER — SODIUM CHLORIDE 0.9 % IV BOLUS
1000.0000 mL | Freq: Once | INTRAVENOUS | Status: AC
  Administered 2019-03-27: 1000 mL via INTRAVENOUS

## 2019-03-27 MED ORDER — METHYLPREDNISOLONE SODIUM SUCC 125 MG IJ SOLR
125.0000 mg | Freq: Once | INTRAMUSCULAR | Status: AC
Start: 2019-03-27 — End: 2019-03-27
  Administered 2019-03-27: 125 mg via INTRAVENOUS
  Filled 2019-03-27: qty 2

## 2019-03-27 MED ORDER — DIPHENHYDRAMINE HCL 50 MG/ML IJ SOLN
25.0000 mg | Freq: Once | INTRAMUSCULAR | Status: AC
Start: 2019-03-27 — End: 2019-03-27
  Administered 2019-03-27: 25 mg via INTRAVENOUS
  Filled 2019-03-27: qty 1

## 2019-03-27 MED ORDER — FAMOTIDINE IN NACL 20-0.9 MG/50ML-% IV SOLN
20.0000 mg | Freq: Once | INTRAVENOUS | Status: AC
  Administered 2019-03-27: 20 mg via INTRAVENOUS
  Filled 2019-03-27: qty 50

## 2019-03-27 MED ORDER — SODIUM CHLORIDE 0.9 % IV SOLN: INTRAVENOUS | Status: DC

## 2019-03-27 NOTE — ED Provider Notes (Signed)
[facility] EMERGENCY DEPARTMENT Provider Note   CSN: [id number] Arrival date & time: 03/27/19  2143    History   Chief Complaint Chief Complaint  Patient presents with  . Insect Bite    HPI Joshua Lewis is a 58 y.o. male.     HPI  Pt is a 58 y/o male presents to the emergency department today for evaluation of an allergic reaction to an insect bite.  States he was wearing gloves and doing some yard work about 2 hours prior to arrival when he felt a stinging sensation to his right hand.  States that he took his gloves off and he was unable to see what stung him however his right hand was swollen and painful.  It is also pruritic.  He later developed hives to his bilateral upper extremities, abdomen chest and back.  He states he looked in the mirror and he thought that his face looked puffy.  Denies swelling to his lips or tongue.  Denies any feeling of throat swelling, sore throat, difficulty swallowing.  Denies any difficulty breathing or wheezing.  He denies any history of anaphylaxis or similar reaction in the past.  Past Medical History:  Diagnosis Date  . Chronic back pain greater than 3 months duration   . Kidney carcinoma ([place])     There are no active problems to display for this patient.   Past Surgical History:  Procedure Laterality Date  . HAND SURGERY    . HERNIA REPAIR    . NEPHRECTOMY     right        Home Medications    Prior to Admission medications   Medication Sig Start Date End Date Taking? Authorizing Provider  acetaminophen (TYLENOL) 500 MG tablet Take 1,000 mg by mouth every 6 (six) hours as needed for mild pain or headache.    [provider]  aspirin 325 MG EC tablet Take 325 mg by mouth daily as needed for pain.    [provider]  diphenhydrAMINE-zinc acetate (BENADRYL ITCH STOPPING) cream Apply topically 3 (three) times daily as needed for itching. 03/28/19   Reine Bristow S, PA-C  EPINEPHrine 0.3 mg/0.3  mL IJ SOAJ injection Inject 0.3 mLs (0.3 mg total) into the muscle as needed for up to 1 dose for anaphylaxis. 03/28/19   Tamon Parkerson S, PA-C  famotidine (PEPCID) 20 MG tablet Take 1 tablet (20 mg total) by mouth 2 (two) times daily for 5 days. 03/28/19 04/02/19  Yailyn Strack S, PA-C  HYDROcodone-acetaminophen (NORCO/VICODIN) 5-325 MG tablet Take 1 tablet by mouth every 6 (six) hours as needed. 09/13/18   Quintella Reichert, MD  ibuprofen (ADVIL,MOTRIN) 600 MG tablet Take 1 tablet (600 mg total) by mouth every 8 (eight) hours as needed. 09/13/18   Quintella Reichert, MD  loratadine (CLARITIN) 10 MG tablet Take 1 tablet (10 mg total) by mouth daily for 5 days. 03/28/19 04/02/19  Denese Mentink S, PA-C  methocarbamol (ROBAXIN) 500 MG tablet Take 1-2 tablets (500-1,000 mg total) by mouth 2 (two) times daily. 09/13/18   Quintella Reichert, MD  predniSONE (STERAPRED UNI-PAK 21 TAB) 10 MG (21) TBPK tablet Take by mouth daily. Take 6 tabs by mouth daily  for 2 days, then 5 tabs for 2 days, then 4 tabs for 2 days, then 3 tabs for 2 days, 2 tabs for 2 days, then 1 tab by mouth daily for 2 days 03/28/19   Allia Wiltsey S, PA-C  Ibuprofen-Diphenhydramine HCl (ADVIL PM) 200-25 MG  CAPS Take 1 tablet by mouth at bedtime. To help sleep  11/25/11  [provider]    Family History Family History  Problem Relation Age of Onset  . Cancer Mother   . Diabetes Mother   . Hypertension Mother     Social History Social History   Tobacco Use  . Smoking status: Current Some Day Smoker    Packs/day: 0.50  . Smokeless tobacco: Never Used  Substance Use Topics  . Alcohol use: No  . Drug use: No     Allergies   Patient has no known allergies.   Review of Systems Review of Systems  Constitutional: Negative for fever.  HENT: Positive for facial swelling. Negative for sore throat.        No tongue or lip swelling, no throat swelling or difficulty swallowing  Eyes: Negative for visual disturbance.  Respiratory:  Negative for cough and shortness of breath.   Cardiovascular: Negative for chest pain.  Gastrointestinal: Negative for abdominal pain, diarrhea and vomiting.  Genitourinary: Negative for flank pain.  Musculoskeletal:       Right hand pain  Skin: Positive for color change and rash.  Neurological: Negative for dizziness, light-headedness and headaches.  All other systems reviewed and are negative.    Physical Exam Updated Vital Signs BP 130/79   Pulse 76   Temp 97.8 F (36.6 C) (Oral)   Resp 15   SpO2 98%   Physical Exam Vitals signs and nursing note reviewed.  Constitutional:      General: He is not in acute distress.    Appearance: He is well-developed. He is not ill-appearing or toxic-appearing.  HENT:     Head: Normocephalic and atraumatic.     Mouth/Throat:     Mouth: Mucous membranes are moist.     Comments: No angioedema.  Patient tolerating secretions.  No stridor. Eyes:     Conjunctiva/sclera: Conjunctivae normal.  Neck:     Musculoskeletal: Neck supple.  Cardiovascular:     Rate and Rhythm: Normal rate and regular rhythm.     Pulses: Normal pulses.     Heart sounds: Normal heart sounds. No murmur.  Pulmonary:     Effort: Pulmonary effort is normal. No respiratory distress.     Breath sounds: Normal breath sounds. No wheezing, rhonchi or rales.  Abdominal:     Palpations: Abdomen is soft.     Tenderness: There is no abdominal tenderness.  Skin:    General: Skin is warm and dry.     Comments: Urticaria noted to the bilateral upper extremities and trunk.  Erythema and swelling noted to the dorsum of the right hand.  Neurological:     Mental Status: He is alert.      ED Treatments / Results  Labs (all labs ordered are listed, but only abnormal results are displayed) Labs Reviewed - No data to display  EKG None  Radiology No results found.  Procedures Procedures (including critical care time)  Medications Ordered in ED Medications  sodium  chloride 0.9 % bolus 1,000 mL (0 mLs Intravenous Stopped 03/28/19 0014)    And  0.9 %  sodium chloride infusion (125 mL/hr Intravenous Not Given 03/28/19 0013)  diphenhydrAMINE (BENADRYL) injection 25 mg (25 mg Intravenous Given 03/27/19 2242)  methylPREDNISolone sodium succinate (SOLU-MEDROL) 125 mg/2 mL injection 125 mg (125 mg Intravenous Given 03/27/19 2242)  famotidine (PEPCID) IVPB 20 mg premix (0 mg Intravenous Stopped 03/28/19 0013)     Initial Impression / Assessment and Plan /  ED Course  I have reviewed the triage vital signs and the nursing notes.  Pertinent labs & imaging results that were available during my care of the patient were reviewed by me and considered in my medical decision making (see chart for details).    Final Clinical Impressions(s) / ED Diagnoses   Final diagnoses:  Allergic reaction, initial encounter   Patient presenting for evaluation of allergic reaction to an insect bite that happened about 2 hours prior to arrival.  He broke out in hives and thought that his face looked different.  On exam he is in no acute distress.  No hypotension.  He does not have any evidence of angioedema, stridor, or difficulty tolerating his secretions.  He does not have any wheezing.  Currently does not have any evidence of anaphylaxis.  Patient given Benadryl, Solu-Medrol and Pepcid.  Also given IV fluids.  Will observe and reassess following medication.  Patient re-evaluated prior to dc, is hemodynamically stable, in no respiratory distress, and denies the feeling of throat closing. Pt has been advised to take OTC claritin & return to the ED if they have a mod-severe allergic rxn (s/s including throat closing, difficulty breathing, swelling of lips face or tongue). Pt is to follow up with their PCP. He was given medications for home to help with his sxs. He was also given epi pen rx and advised when it is appropriate to use this. Pt is agreeable with plan & verbalizes understanding.   Discussed pt presentation and exam findings with Dr. Venora Maples, who personally evaluated the pt and agrees with the plan to give pepcid, benadryl and steroids and reassess.    ED Discharge Orders         Ordered    predniSONE (STERAPRED UNI-PAK 21 TAB) 10 MG (21) TBPK tablet  Daily     03/28/19 0007    famotidine (PEPCID) 20 MG tablet  2 times daily     03/28/19 0007    loratadine (CLARITIN) 10 MG tablet  Daily     03/28/19 0007    EPINEPHrine 0.3 mg/0.3 mL IJ SOAJ injection  As needed     03/28/19 0007    diphenhydrAMINE-zinc acetate (BENADRYL ITCH STOPPING) cream  3 times daily PRN     03/28/19 0007           Rodney Booze, PA-C 03/28/19 Karie Mainland, MD 03/28/19 [phone]

## 2019-03-27 NOTE — ED Triage Notes (Signed)
Per pt he was stung tonight by a bee on his right hand.Pt said painful and is swollen. Says it is itchy.

## 2019-03-28 MED ORDER — EPINEPHRINE 0.3 MG/0.3ML IJ SOAJ: 0.3000 mg | INTRAMUSCULAR | 0 refills | Status: DC | PRN

## 2019-03-28 MED ORDER — BENADRYL ITCH STOPPING 1-0.1 % EX CREA: TOPICAL_CREAM | Freq: Three times a day (TID) | CUTANEOUS | 0 refills | Status: DC | PRN

## 2019-03-28 MED ORDER — PREDNISONE 10 MG (21) PO TBPK: ORAL_TABLET | Freq: Every day | ORAL | 0 refills | Status: DC

## 2019-03-28 MED ORDER — FAMOTIDINE 20 MG PO TABS: 20.0000 mg | ORAL_TABLET | Freq: Two times a day (BID) | ORAL | 0 refills | Status: DC

## 2019-03-28 MED ORDER — LORATADINE 10 MG PO TABS: 10.0000 mg | ORAL_TABLET | Freq: Every day | ORAL | 0 refills | Status: DC

## 2019-03-28 NOTE — Discharge Instructions (Addendum)
You were given a prescription for Claritin, Pepcid and prednisone to help with your rash and allergic reaction.  You were also given Benadryl cream to use on your hand for any irritation/itching.  Please take those as directed.  You were also given a prescription for an EpiPen.  You should only use this if you experience swelling to your face, lips, throat, difficulty swallowing or difficulty breathing when experiencing an allergic reaction.  If you use this medication you will need to proceed directly to the emergency department.    Please make an appointment to follow-up with your regular doctor in the next 5 to 7 days for reevaluation.    Please return to the emergency department for any new or worsening symptoms including persistent rash, difficulty breathing, difficulty swallowing, facial swelling.

## 2019-04-21 ENCOUNTER — Emergency Department (HOSPITAL_COMMUNITY)
Admission: EM | Admit: 2019-04-21 | Discharge: 2019-04-21 | Disposition: A | Discharge status: Home / Self Care | Payer: Self-pay | Attending: Emergency Medicine | Admitting: Emergency Medicine

## 2019-04-21 ENCOUNTER — Other Ambulatory Visit: Payer: Self-pay

## 2019-04-21 ENCOUNTER — Encounter (HOSPITAL_COMMUNITY): Payer: Self-pay | Admitting: Emergency Medicine

## 2019-04-21 DIAGNOSIS — Z79899 Other long term (current) drug therapy: Secondary | ICD-10-CM | POA: Insufficient documentation

## 2019-04-21 DIAGNOSIS — L03115 Cellulitis of right lower limb: Secondary | ICD-10-CM | POA: Insufficient documentation

## 2019-04-21 DIAGNOSIS — Z7982 Long term (current) use of aspirin: Secondary | ICD-10-CM | POA: Insufficient documentation

## 2019-04-21 DIAGNOSIS — F17200 Nicotine dependence, unspecified, uncomplicated: Secondary | ICD-10-CM | POA: Insufficient documentation

## 2019-04-21 DIAGNOSIS — L03116 Cellulitis of left lower limb: Secondary | ICD-10-CM | POA: Insufficient documentation

## 2019-04-21 DIAGNOSIS — B353 Tinea pedis: Secondary | ICD-10-CM | POA: Insufficient documentation

## 2019-04-21 MED ORDER — CEPHALEXIN 500 MG PO CAPS: 500.0000 mg | ORAL_CAPSULE | Freq: Four times a day (QID) | ORAL | 0 refills | Status: AC

## 2019-04-21 NOTE — Discharge Instructions (Signed)
Take Keflex as prescribed and complete the full course. Clean legs with mild soap, apply bacitracin to wound.  This is available over-the-counter. Apply topical medication for athlete's foot, this is available in powders, sprays, creams.  Also recommend using a light bleach spray in the shower.  Change her socks frequently, apply fungal powder tissues to prevent reinfection. Recheck with your primary care provider this week, return to ER for new or worsening symptoms.

## 2019-04-21 NOTE — ED Triage Notes (Addendum)
Had an itch on his rt foot and scratched it with dirty  wire brsh and now his foot  Is swollen and red

## 2019-04-21 NOTE — ED Provider Notes (Signed)
[facility] EMERGENCY DEPARTMENT Provider Note   CSN: [id number] Arrival date & time: 04/21/19  1059    History   Chief Complaint Chief Complaint  Patient presents with  . Rash    HPI Joshua Lewis is a 58 y.o. male.     58 year old male presents with complaint of redness and swelling to both of his feet.  Patient states that his feet were itching a few days ago so he took a wire brush and scratched his feet.  Last tetanus is less than 5 years ago.  Patient is not a diabetic.  No other complaints or concerns.     Past Medical History:  Diagnosis Date  . Chronic back pain greater than 3 months duration   . Kidney carcinoma ([place])     There are no active problems to display for this patient.   Past Surgical History:  Procedure Laterality Date  . HAND SURGERY    . HERNIA REPAIR    . NEPHRECTOMY     right        Home Medications    Prior to Admission medications   Medication Sig Start Date End Date Taking? Authorizing Provider  acetaminophen (TYLENOL) 500 MG tablet Take 1,000 mg by mouth every 6 (six) hours as needed for mild pain or headache.    [provider]  aspirin 325 MG EC tablet Take 325 mg by mouth daily as needed for pain.    [provider]  cephALEXin (KEFLEX) 500 MG capsule Take 1 capsule (500 mg total) by mouth 4 (four) times daily for 7 days. 04/21/19 04/28/19  Tacy Learn, PA-C  diphenhydrAMINE-zinc acetate (BENADRYL ITCH STOPPING) cream Apply topically 3 (three) times daily as needed for itching. 03/28/19   Couture, Cortni S, PA-C  EPINEPHrine 0.3 mg/0.3 mL IJ SOAJ injection Inject 0.3 mLs (0.3 mg total) into the muscle as needed for up to 1 dose for anaphylaxis. 03/28/19   Couture, Cortni S, PA-C  famotidine (PEPCID) 20 MG tablet Take 1 tablet (20 mg total) by mouth 2 (two) times daily for 5 days. 03/28/19 04/02/19  Couture, Cortni S, PA-C  HYDROcodone-acetaminophen (NORCO/VICODIN) 5-325 MG tablet Take 1 tablet by  mouth every 6 (six) hours as needed. 09/13/18   Quintella Reichert, MD  ibuprofen (ADVIL,MOTRIN) 600 MG tablet Take 1 tablet (600 mg total) by mouth every 8 (eight) hours as needed. 09/13/18   Quintella Reichert, MD  loratadine (CLARITIN) 10 MG tablet Take 1 tablet (10 mg total) by mouth daily for 5 days. 03/28/19 04/02/19  Couture, Cortni S, PA-C  methocarbamol (ROBAXIN) 500 MG tablet Take 1-2 tablets (500-1,000 mg total) by mouth 2 (two) times daily. 09/13/18   Quintella Reichert, MD  predniSONE (STERAPRED UNI-PAK 21 TAB) 10 MG (21) TBPK tablet Take by mouth daily. Take 6 tabs by mouth daily  for 2 days, then 5 tabs for 2 days, then 4 tabs for 2 days, then 3 tabs for 2 days, 2 tabs for 2 days, then 1 tab by mouth daily for 2 days 03/28/19   Couture, Cortni S, PA-C  Ibuprofen-Diphenhydramine HCl (ADVIL PM) 200-25 MG CAPS Take 1 tablet by mouth at bedtime. To help sleep  11/25/11  [provider]    Family History Family History  Problem Relation Age of Onset  . Cancer Mother   . Diabetes Mother   . Hypertension Mother     Social History Social History   Tobacco Use  . Smoking status: Current Some Day  Smoker    Packs/day: 0.50  . Smokeless tobacco: Never Used  Substance Use Topics  . Alcohol use: No  . Drug use: No     Allergies   Patient has no known allergies.   Review of Systems Review of Systems  Constitutional: Negative for fever.  Musculoskeletal: Positive for myalgias. Negative for arthralgias.  Skin: Positive for rash and wound.  Allergic/Immunologic: Negative for immunocompromised state.  Neurological: Negative for weakness and numbness.  All other systems reviewed and are negative.    Physical Exam Updated Vital Signs BP (!) 154/79 (BP Location: Left Arm)   Pulse 79   Temp 98.1 F (36.7 C) (Oral)   Resp 16   SpO2 97%   Physical Exam Vitals signs and nursing note reviewed.  Constitutional:      General: He is not in acute distress.    Appearance: Normal  appearance. He is well-developed. He is not diaphoretic.  HENT:     Head: Normocephalic and atraumatic.  Cardiovascular:     Pulses: Normal pulses.  Pulmonary:     Effort: Pulmonary effort is normal.  Musculoskeletal:        General: Swelling and tenderness present. No deformity.  Skin:    General: Skin is warm and dry.     Findings: Erythema and rash present.       Neurological:     Mental Status: He is alert and oriented to person, place, and time.  Psychiatric:        Behavior: Behavior normal.      ED Treatments / Results  Labs (all labs ordered are listed, but only abnormal results are displayed) Labs Reviewed - No data to display  EKG None  Radiology No results found.  Procedures Procedures (including critical care time)  Medications Ordered in ED Medications - No data to display   Initial Impression / Assessment and Plan / ED Course  I have reviewed the triage vital signs and the nursing notes.  Pertinent labs & imaging results that were available during my care of the patient were reviewed by me and considered in my medical decision making (see chart for details).  Clinical Course as of Apr 20 1245  Mon Apr 21, 2019  1244 57yo male with complaint of foot itching, now with redness and swelling after scratching feet with a dirty wire brush. Tdap UTD, suspect initial problem of tinea pedis now with cellulitis. Given rx for Keflex, recommend OTC meds for tinea and apply bacitracin to foot wounds. Recheck with PCP this week for wound recheck, return to ER for worsening or concerning symptoms.    [LM]    Clinical Course User Index [LM] Tacy Learn, PA-C      Final Clinical Impressions(s) / ED Diagnoses   Final diagnoses:  Cellulitis of both feet  Tinea pedis of both feet    ED Discharge Orders         Ordered    cephALEXin (KEFLEX) 500 MG capsule  4 times daily     04/21/19 1210           Tacy Learn, PA-C 04/21/19 1246    Sherwood Gambler, MD 04/22/19 0710

## 2019-05-26 ENCOUNTER — Other Ambulatory Visit: Payer: Self-pay

## 2019-05-26 ENCOUNTER — Emergency Department (HOSPITAL_COMMUNITY)
Admission: EM | Admit: 2019-05-26 | Discharge: 2019-05-26 | Disposition: A | Discharge status: Home / Self Care | Payer: No Typology Code available for payment source | Attending: Emergency Medicine | Admitting: Emergency Medicine

## 2019-05-26 DIAGNOSIS — Z7689 Persons encountering health services in other specified circumstances: Secondary | ICD-10-CM | POA: Diagnosis not present

## 2019-05-26 DIAGNOSIS — J3489 Other specified disorders of nose and nasal sinuses: Secondary | ICD-10-CM | POA: Diagnosis not present

## 2019-05-26 DIAGNOSIS — F17210 Nicotine dependence, cigarettes, uncomplicated: Secondary | ICD-10-CM | POA: Diagnosis not present

## 2019-05-26 DIAGNOSIS — Z79899 Other long term (current) drug therapy: Secondary | ICD-10-CM | POA: Diagnosis not present

## 2019-05-26 NOTE — ED Provider Notes (Signed)
[facility] EMERGENCY DEPARTMENT Provider Note   CSN: [id number] Arrival date & time: 05/26/19  [id number]     History   Chief Complaint Chief Complaint  Patient presents with  . Work Note    HPI Joshua Lewis is a 58 y.o. male who presents to the ED today requesting work note to be able to return back to work. He reports having cold like symptoms last week including rhinorrhea and diarrhea that resolved on Friday 09/11. No known covid 19 positive exposures. Pt denies having fever, chills, sore throat, ear pain, cough, SOB, abdominal pain, nausea, vomiting, or any other associated symptoms.        Past Medical History:  Diagnosis Date  . Chronic back pain greater than 3 months duration   . Kidney carcinoma ([place])     There are no active problems to display for this patient.   Past Surgical History:  Procedure Laterality Date  . HAND SURGERY    . HERNIA REPAIR    . NEPHRECTOMY     right        Home Medications    Prior to Admission medications   Medication Sig Start Date End Date Taking? Authorizing Provider  acetaminophen (TYLENOL) 500 MG tablet Take 1,000 mg by mouth every 6 (six) hours as needed for mild pain or headache.    [provider]  aspirin 325 MG EC tablet Take 325 mg by mouth daily as needed for pain.    [provider]  diphenhydrAMINE-zinc acetate (BENADRYL ITCH STOPPING) cream Apply topically 3 (three) times daily as needed for itching. 03/28/19   Couture, Cortni S, PA-C  EPINEPHrine 0.3 mg/0.3 mL IJ SOAJ injection Inject 0.3 mLs (0.3 mg total) into the muscle as needed for up to 1 dose for anaphylaxis. 03/28/19   Couture, Cortni S, PA-C  famotidine (PEPCID) 20 MG tablet Take 1 tablet (20 mg total) by mouth 2 (two) times daily for 5 days. 03/28/19 04/02/19  Couture, Cortni S, PA-C  HYDROcodone-acetaminophen (NORCO/VICODIN) 5-325 MG tablet Take 1 tablet by mouth every 6 (six) hours as needed. 09/13/18   Quintella Reichert, MD   ibuprofen (ADVIL,MOTRIN) 600 MG tablet Take 1 tablet (600 mg total) by mouth every 8 (eight) hours as needed. 09/13/18   Quintella Reichert, MD  loratadine (CLARITIN) 10 MG tablet Take 1 tablet (10 mg total) by mouth daily for 5 days. 03/28/19 04/02/19  Couture, Cortni S, PA-C  methocarbamol (ROBAXIN) 500 MG tablet Take 1-2 tablets (500-1,000 mg total) by mouth 2 (two) times daily. 09/13/18   Quintella Reichert, MD  predniSONE (STERAPRED UNI-PAK 21 TAB) 10 MG (21) TBPK tablet Take by mouth daily. Take 6 tabs by mouth daily  for 2 days, then 5 tabs for 2 days, then 4 tabs for 2 days, then 3 tabs for 2 days, 2 tabs for 2 days, then 1 tab by mouth daily for 2 days 03/28/19   Couture, Cortni S, PA-C  Ibuprofen-Diphenhydramine HCl (ADVIL PM) 200-25 MG CAPS Take 1 tablet by mouth at bedtime. To help sleep  11/25/11  [provider]    Family History Family History  Problem Relation Age of Onset  . Cancer Mother   . Diabetes Mother   . Hypertension Mother     Social History Social History   Tobacco Use  . Smoking status: Current Some Day Smoker    Packs/day: 0.50  . Smokeless tobacco: Never Used  Substance Use Topics  . Alcohol use: No  . Drug  use: No     Allergies   Patient has no known allergies.   Review of Systems Review of Systems  HENT: Positive for rhinorrhea (resolved).   Gastrointestinal: Positive for diarrhea (resolved).     Physical Exam Updated Vital Signs BP 135/77 (BP Location: Right Arm)   Pulse 71   Temp 97.7 F (36.5 C) (Oral)   Resp 15   SpO2 98%   Physical Exam Vitals signs and nursing note reviewed.  Constitutional:      Appearance: He is not ill-appearing.  HENT:     Head: Normocephalic and atraumatic.     Right Ear: Tympanic membrane normal.     Left Ear: Tympanic membrane normal.     Nose: Nose normal.  Eyes:     Conjunctiva/sclera: Conjunctivae normal.  Cardiovascular:     Rate and Rhythm: Normal rate and regular rhythm.     Pulses: Normal  pulses.  Pulmonary:     Effort: Pulmonary effort is normal.     Breath sounds: Normal breath sounds. No wheezing, rhonchi or rales.  Lymphadenopathy:     Cervical: No cervical adenopathy.  Skin:    General: Skin is warm and dry.     Coloration: Skin is not jaundiced.  Neurological:     Mental Status: He is alert.      ED Treatments / Results  Labs (all labs ordered are listed, but only abnormal results are displayed) Labs Reviewed - No data to display  EKG None  Radiology No results found.  Procedures Procedures (including critical care time)  Medications Ordered in ED Medications - No data to display   Initial Impression / Assessment and Plan / ED Course  I have reviewed the triage vital signs and the nursing notes.  Pertinent labs & imaging results that were available during my care of the patient were reviewed by me and considered in my medical decision making (see chart for details).    58 year old male who presents to the ED requesting return to work note.  States that he had rhinorrhea and diarrhea last week that has since resolved.  No known COVID-19 positive exposures.  No other complaints.  Patient afebrile in the ED today 97.7.  Note given.  Patient advised to follow-up with PCP for primary care needs. He is in agreement with plan and stable for discharge home.  This note was prepared using Dragon voice recognition software and may include unintentional dictation errors due to the inherent limitations of voice recognition software.       Final Clinical Impressions(s) / ED Diagnoses   Final diagnoses:  Return to work evaluation    ED Discharge Orders    None       Eustaquio Maize, [place] 05/26/19 0825    Lajean Saver, MD 05/26/19 [phone]

## 2019-05-26 NOTE — ED Notes (Signed)
Patient verbalizes understanding of discharge instructions. Opportunity for questioning and answers were provided. Armband removed by staff, pt discharged from ED.  

## 2019-05-26 NOTE — ED Triage Notes (Signed)
Patient reports having cold symptoms last week and he needs a work note. States all symptoms have resolved, no complaints.

## 2019-05-26 NOTE — Discharge Instructions (Signed)
Please follow up with your PCP as needed. If you do not have one you may follow up with [facility] for your primary care needs

## 2019-12-14 ENCOUNTER — Other Ambulatory Visit: Payer: Self-pay

## 2019-12-14 ENCOUNTER — Emergency Department (HOSPITAL_COMMUNITY)
Admission: EM | Admit: 2019-12-14 | Discharge: 2019-12-14 | Disposition: A | Discharge status: Home / Self Care | Payer: No Typology Code available for payment source | Attending: Emergency Medicine | Admitting: Emergency Medicine

## 2019-12-14 ENCOUNTER — Encounter (HOSPITAL_COMMUNITY): Payer: Self-pay

## 2019-12-14 DIAGNOSIS — Z720 Tobacco use: Secondary | ICD-10-CM | POA: Diagnosis not present

## 2019-12-14 DIAGNOSIS — L299 Pruritus, unspecified: Secondary | ICD-10-CM | POA: Diagnosis not present

## 2019-12-14 DIAGNOSIS — L234 Allergic contact dermatitis due to dyes: Secondary | ICD-10-CM

## 2019-12-14 DIAGNOSIS — Z7982 Long term (current) use of aspirin: Secondary | ICD-10-CM | POA: Diagnosis not present

## 2019-12-14 DIAGNOSIS — Z85528 Personal history of other malignant neoplasm of kidney: Secondary | ICD-10-CM | POA: Insufficient documentation

## 2019-12-14 DIAGNOSIS — R21 Rash and other nonspecific skin eruption: Secondary | ICD-10-CM | POA: Diagnosis present

## 2019-12-14 DIAGNOSIS — Z79899 Other long term (current) drug therapy: Secondary | ICD-10-CM | POA: Diagnosis not present

## 2019-12-14 LAB — RPR: RPR Ser Ql: NONREACTIVE

## 2019-12-14 MED ORDER — CEPHALEXIN 500 MG PO CAPS: 1000.0000 mg | ORAL_CAPSULE | Freq: Two times a day (BID) | ORAL | 0 refills | Status: DC

## 2019-12-14 MED ORDER — PREDNISONE 50 MG PO TABS: 50.0000 mg | ORAL_TABLET | Freq: Every day | ORAL | 0 refills | Status: DC

## 2019-12-14 MED ORDER — CLOTRIMAZOLE-BETAMETHASONE 1-0.05 % EX CREA: TOPICAL_CREAM | CUTANEOUS | 0 refills | Status: DC

## 2019-12-14 NOTE — ED Provider Notes (Signed)
[facility] EMERGENCY DEPARTMENT Provider Note   CSN: [id number] Arrival date & time: 12/14/19  [id number]     History Chief Complaint  Patient presents with  . Rash    Joshua Lewis is a 59 y.o. male.  HPI Patient presents to the emergency department with    Patient presents to the emergency department with a rash to his back and penis.  The patient states that the area on the penis is not painful but does itch.  The patient states that nothing seems make condition better or worse.  The patient denies chest pain, shortness of breath, headache,blurred vision, neck pain, fever, cough, weakness, numbness, dizziness, anorexia, edema, abdominal pain, nausea, vomiting, diarrhea, rash, back pain, dysuria, hematemesis, bloody stool, near syncope, or syncope. Past Medical History:  Diagnosis Date  . Chronic back pain greater than 3 months duration   . Kidney carcinoma ([place])     There are no problems to display for this patient.   Past Surgical History:  Procedure Laterality Date  . HAND SURGERY    . HERNIA REPAIR    . NEPHRECTOMY     right       Family History  Problem Relation Age of Onset  . Cancer Mother   . Diabetes Mother   . Hypertension Mother     Social History   Tobacco Use  . Smoking status: Current Some Day Smoker    Packs/day: 0.50  . Smokeless tobacco: Never Used  Substance Use Topics  . Alcohol use: No  . Drug use: No    Home Medications Prior to Admission medications   Medication Sig Start Date End Date Taking? Authorizing Provider  acetaminophen (TYLENOL) 500 MG tablet Take 1,000 mg by mouth every 6 (six) hours as needed for mild pain or headache.    [provider]  aspirin 325 MG EC tablet Take 325 mg by mouth daily as needed for pain.    [provider]  diphenhydrAMINE-zinc acetate (BENADRYL ITCH STOPPING) cream Apply topically 3 (three) times daily as needed for itching. 03/28/19   Couture, Cortni S, PA-C    EPINEPHrine 0.3 mg/0.3 mL IJ SOAJ injection Inject 0.3 mLs (0.3 mg total) into the muscle as needed for up to 1 dose for anaphylaxis. 03/28/19   Couture, Cortni S, PA-C  famotidine (PEPCID) 20 MG tablet Take 1 tablet (20 mg total) by mouth 2 (two) times daily for 5 days. 03/28/19 04/02/19  Couture, Cortni S, PA-C  HYDROcodone-acetaminophen (NORCO/VICODIN) 5-325 MG tablet Take 1 tablet by mouth every 6 (six) hours as needed. 09/13/18   Quintella Reichert, MD  ibuprofen (ADVIL,MOTRIN) 600 MG tablet Take 1 tablet (600 mg total) by mouth every 8 (eight) hours as needed. 09/13/18   Quintella Reichert, MD  loratadine (CLARITIN) 10 MG tablet Take 1 tablet (10 mg total) by mouth daily for 5 days. 03/28/19 04/02/19  Couture, Cortni S, PA-C  methocarbamol (ROBAXIN) 500 MG tablet Take 1-2 tablets (500-1,000 mg total) by mouth 2 (two) times daily. 09/13/18   Quintella Reichert, MD  predniSONE (STERAPRED UNI-PAK 21 TAB) 10 MG (21) TBPK tablet Take by mouth daily. Take 6 tabs by mouth daily  for 2 days, then 5 tabs for 2 days, then 4 tabs for 2 days, then 3 tabs for 2 days, 2 tabs for 2 days, then 1 tab by mouth daily for 2 days 03/28/19   Couture, Cortni S, PA-C  Ibuprofen-Diphenhydramine HCl (ADVIL PM) 200-25 MG CAPS Take 1 tablet by mouth  at bedtime. To help sleep  11/25/11  [provider]    Allergies    Patient has no known allergies.  Review of Systems   Review of Systems All other systems negative except as documented in the HPI. All pertinent positives and negatives as reviewed in the HPI. Physical Exam Updated Vital Signs BP (!) 145/74 (BP Location: Left Arm)   Pulse 79   Temp 98.3 F (36.8 C) (Oral)   Resp 16   SpO2 98%   Physical Exam Vitals and nursing note reviewed.  Constitutional:      General: He is not in acute distress.    Appearance: He is well-developed.  HENT:     Head: Normocephalic and atraumatic.  Eyes:     Pupils: Pupils are equal, round, and reactive to light.  Pulmonary:      Effort: Pulmonary effort is normal.  Genitourinary:    Comments: Patient has some dried rash type areas to the penis.  They are at the distal end just before the head of the penis Skin:    General: Skin is warm and dry.     Findings: Rash present.  Neurological:     Mental Status: He is alert and oriented to person, place, and time.     ED Results / Procedures / Treatments   Labs (all labs ordered are listed, but only abnormal results are displayed) Labs Reviewed  RPR    EKG None  Radiology No results found.  Procedures Procedures (including critical care time)  Medications Ordered in ED Medications - No data to display  ED Course  I have reviewed the triage vital signs and the nursing notes.  Pertinent labs & imaging results that were available during my care of the patient were reviewed by me and considered in my medical decision making (see chart for details).    MDM Rules/Calculators/A&P                      We will treat the rash on his penis with Lotrisone this could represent a fungal type infection.  Have advised the patient to return here as needed.  Did run an RPR to ensure that this was not a syphilis type lesion. Final Clinical Impression(s) / ED Diagnoses Final diagnoses:  None    Rx / DC Orders ED Discharge Orders    None       Dalia Heading, PA-C 12/16/19 1747    Charlesetta Shanks, MD 12/24/19 0003

## 2019-12-14 NOTE — ED Notes (Signed)
Patient Alert and oriented to baseline. Stable and ambulatory to baseline. Patient verbalized understanding of the discharge instructions.  Patient belongings were taken by the patient.   

## 2019-12-14 NOTE — ED Triage Notes (Signed)
Pt reports that for the past two weeks he has had a rash on his chin after using a new dye on his beard. Pt reports a bump on the side of his chin as well, possible abscess forming.

## 2019-12-14 NOTE — Discharge Instructions (Addendum)
Return here as needed.  Follow-up with a primary doctor for reevaluation of these areas.  There is any abnormalities on your lab test we will inform you.

## 2019-12-14 NOTE — ED Notes (Signed)
Pt also adds that he has a rash in his pubic region that he wants checked as well that has been there for a month

## 2019-12-16 ENCOUNTER — Telehealth: Payer: Self-pay | Admitting: *Deleted

## 2019-12-16 NOTE — Telephone Encounter (Signed)
Pt wife called regarding pt having lost insurance.    EDCM reviewed chart and spoke with the pt wife about [facility] MATCH program ($3 co pay for each Rx through [facility] program, does not include refills, 7 day expiration of [place] letter and choice of pharmacies). Pt is eligible for [facility] MATCH program (unable to find pt listed in PROCARE per cardholder name inquiry) and has agreed to accept [place] under terms discussed. PROCARE information entered. [place] letter completed and provided to pt.  EDCM updated EDP and ED RN.

## 2019-12-16 NOTE — Telephone Encounter (Signed)
  [place] Medication Assistance Card Name: [facility] [place] ID (MRN): [id number] [place]: [id number] RX Group: [id number] Discharge Date: 12/16/2019 Expiration Date:12/24/2019                                           (must be filled within 7 days of discharge)     You have been approved to have the prescriptions written by your discharging physician filled through our [facility] (Medication Assistance Through [facility]) program. This program allows for a one-time (no refills) 34-day supply of selected medications for a low copay amount.  The copay is $3.00 per prescription. For instance, if you have one prescription, you will pay $3.00; for two prescriptions, you pay $6.00; for three prescriptions, you pay $9.00; and so on.  Only certain pharmacies are participating in this program with [facility]. You will need to select one of the pharmacies from the attached list and take your prescriptions, this letter, and your photo ID to one of the participating pharmacies.   We are excited that you are able to use the [facility] program to get your medications. These prescriptions must be filled within 7 days of hospital discharge or they will no longer be valid for the [facility] program. Should you have any problems with your prescriptions please contact your case management team member at [phone] for [place]/ [place] you, [place] Management

## 2019-12-25 ENCOUNTER — Ambulatory Visit: Payer: No Typology Code available for payment source

## 2020-01-13 ENCOUNTER — Emergency Department (HOSPITAL_BASED_OUTPATIENT_CLINIC_OR_DEPARTMENT_OTHER)
Admit: 2020-01-13 | Discharge: 2020-01-13 | Disposition: A | Discharge status: Home / Self Care | Payer: No Typology Code available for payment source | Attending: Emergency Medicine | Admitting: Emergency Medicine

## 2020-01-13 ENCOUNTER — Emergency Department (HOSPITAL_COMMUNITY): Payer: No Typology Code available for payment source

## 2020-01-13 ENCOUNTER — Emergency Department (HOSPITAL_COMMUNITY)
Admission: EM | Admit: 2020-01-13 | Discharge: 2020-01-13 | Disposition: A | Discharge status: Home / Self Care | Payer: No Typology Code available for payment source | Attending: Emergency Medicine | Admitting: Emergency Medicine

## 2020-01-13 ENCOUNTER — Encounter (HOSPITAL_COMMUNITY): Payer: Self-pay

## 2020-01-13 DIAGNOSIS — R609 Edema, unspecified: Secondary | ICD-10-CM | POA: Insufficient documentation

## 2020-01-13 DIAGNOSIS — Z85528 Personal history of other malignant neoplasm of kidney: Secondary | ICD-10-CM | POA: Insufficient documentation

## 2020-01-13 DIAGNOSIS — Z720 Tobacco use: Secondary | ICD-10-CM | POA: Insufficient documentation

## 2020-01-13 DIAGNOSIS — R062 Wheezing: Secondary | ICD-10-CM | POA: Insufficient documentation

## 2020-01-13 DIAGNOSIS — Z905 Acquired absence of kidney: Secondary | ICD-10-CM | POA: Insufficient documentation

## 2020-01-13 LAB — HEPATIC FUNCTION PANEL
ALT: 14 U/L (ref 0–44)
AST: 21 U/L (ref 15–41)
Albumin: 3.5 g/dL (ref 3.5–5.0)
Alkaline Phosphatase: 115 U/L (ref 38–126)
Bilirubin, Direct: <0.1 mg/dL (ref 0.0–0.2)
Total Bilirubin: 0.9 mg/dL (ref 0.3–1.2)
Total Protein: 6.7 g/dL (ref 6.5–8.1)

## 2020-01-13 LAB — CBC
HCT: 38.4 % — ABNORMAL LOW (ref 39.0–52.0)
Hemoglobin: 12.1 g/dL — ABNORMAL LOW (ref 13.0–17.0)
MCH: 28.9 pg (ref 26.0–34.0)
MCHC: 31.5 g/dL (ref 30.0–36.0)
MCV: 91.9 fL (ref 80.0–100.0)
Platelets: 235 10*3/uL (ref 150–400)
RBC: 4.18 MIL/uL — ABNORMAL LOW (ref 4.22–5.81)
RDW: 13.4 % (ref 11.5–15.5)
WBC: 7.2 10*3/uL (ref 4.0–10.5)
nRBC: 0 % (ref 0.0–0.2)

## 2020-01-13 LAB — BASIC METABOLIC PANEL
Anion gap: 10 (ref 5–15)
BUN: 19 mg/dL (ref 6–20)
CO2: 27 mmol/L (ref 22–32)
Calcium: 8.6 mg/dL — ABNORMAL LOW (ref 8.9–10.3)
Chloride: 100 mmol/L (ref 98–111)
Creatinine, Ser: 1.15 mg/dL (ref 0.61–1.24)
GFR calc Af Amer: >60 mL/min (ref >60)
GFR calc non Af Amer: >60 mL/min (ref >60)
Glucose, Bld: 124 mg/dL — ABNORMAL HIGH (ref 70–99)
Potassium: 3.9 mmol/L (ref 3.5–5.1)
Sodium: 137 mmol/L (ref 135–145)

## 2020-01-13 LAB — BRAIN NATRIURETIC PEPTIDE: B Natriuretic Peptide: 24.7 pg/mL (ref 0.0–100.0)

## 2020-01-13 MED ORDER — FUROSEMIDE 20 MG PO TABS: 20.0000 mg | ORAL_TABLET | Freq: Every day | ORAL | 0 refills | Status: DC

## 2020-01-13 MED ORDER — FUROSEMIDE 10 MG/ML IJ SOLN
20.0000 mg | Freq: Once | INTRAMUSCULAR | Status: AC
  Administered 2020-01-13: 20 mg via INTRAVENOUS
  Filled 2020-01-13: qty 2

## 2020-01-13 NOTE — ED Provider Notes (Signed)
[facility] EMERGENCY DEPARTMENT Provider Note   CSN: [id number] Arrival date & time: 01/13/20  0604     History Chief Complaint  Patient presents with   Leg Swelling    Joshua Lewis is a 59 y.o. male presenting for evaluation of leg swelling.   Patient states that the past 2 to 3 days, he has had a lot of bilateral leg swelling, causing his feet to hurt a lot.  Is worse at the end of the day, improves when he first wakes up in the morning.  He denies history of previous.  His left is worse than his right.  He denies chest pain, shortness of breath, nausea, vomiting, Donnell pain, urinary symptoms, abnormal bowel movements.  He has a history of a previous nephrectomy.  He does not follow with a primary care doctor, has not seen a doctor in over 5 years.  He has not taken anything for his symptoms.  Additional history obtained from chart review.  Patient with a history of kidney carcinoma status post nephrectomy.  HPI     Past Medical History:  Diagnosis Date   Chronic back pain greater than 3 months duration    Kidney carcinoma ([place])     There are no problems to display for this patient.   Past Surgical History:  Procedure Laterality Date   HAND SURGERY     HERNIA REPAIR     NEPHRECTOMY     right       Family History  Problem Relation Age of Onset   Cancer Mother    Diabetes Mother    Hypertension Mother     Social History   Tobacco Use   Smoking status: Current Some Day Smoker    Packs/day: 0.50   Smokeless tobacco: Never Used  Substance Use Topics   Alcohol use: No   Drug use: No    Home Medications Prior to Admission medications   Medication Sig Start Date End Date Taking? Authorizing Provider  EPINEPHrine 0.3 mg/0.3 mL IJ SOAJ injection Inject 0.3 mLs (0.3 mg total) into the muscle as needed for up to 1 dose for anaphylaxis. 03/28/19  Yes Couture, Cortni S, PA-C  acetaminophen (TYLENOL) 500 MG tablet Take 1,000 mg by  mouth every 6 (six) hours as needed for mild pain or headache.    [provider]  cephALEXin (KEFLEX) 500 MG capsule Take 2 capsules (1,000 mg total) by mouth 2 (two) times daily. Patient not taking: Reported on 01/13/2020 12/14/19   Dalia Heading, PA-C  clotrimazole-betamethasone (LOTRISONE) cream Apply to penile area 2 times daily prn Patient not taking: Reported on 01/13/2020 12/14/19   Dalia Heading, PA-C  diphenhydrAMINE-zinc acetate (BENADRYL ITCH STOPPING) cream Apply topically 3 (three) times daily as needed for itching. Patient not taking: Reported on 01/13/2020 03/28/19   Couture, Cortni S, PA-C  famotidine (PEPCID) 20 MG tablet Take 1 tablet (20 mg total) by mouth 2 (two) times daily for 5 days. 03/28/19 04/02/19  Couture, Cortni S, PA-C  furosemide (LASIX) 20 MG tablet Take 1 tablet (20 mg total) by mouth daily for 5 days. 01/13/20 01/18/20  Adine Heimann, PA-C  HYDROcodone-acetaminophen (NORCO/VICODIN) 5-325 MG tablet Take 1 tablet by mouth every 6 (six) hours as needed. Patient not taking: Reported on 01/13/2020 09/13/18   Quintella Reichert, MD  ibuprofen (ADVIL,MOTRIN) 600 MG tablet Take 1 tablet (600 mg total) by mouth every 8 (eight) hours as needed. Patient not taking: Reported on 01/13/2020 09/13/18   Quintella Reichert,  MD  loratadine (CLARITIN) 10 MG tablet Take 1 tablet (10 mg total) by mouth daily for 5 days. 03/28/19 04/02/19  Couture, Cortni S, PA-C  methocarbamol (ROBAXIN) 500 MG tablet Take 1-2 tablets (500-1,000 mg total) by mouth 2 (two) times daily. Patient not taking: Reported on 01/13/2020 09/13/18   Quintella Reichert, MD  predniSONE (DELTASONE) 50 MG tablet Take 1 tablet (50 mg total) by mouth daily. Patient not taking: Reported on 01/13/2020 12/14/19   Dalia Heading, PA-C  Ibuprofen-Diphenhydramine HCl (ADVIL PM) 200-25 MG CAPS Take 1 tablet by mouth at bedtime. To help sleep  11/25/11  [provider]    Allergies    Patient has no known allergies.  Review of  Systems   Review of Systems  Cardiovascular: Positive for leg swelling.  All other systems reviewed and are negative.   Physical Exam Updated Vital Signs BP (!) 130/95    Pulse 67    Temp 98 F (36.7 C) (Oral)    Resp 16    SpO2 100%   Physical Exam Vitals and nursing note reviewed.  Constitutional:      General: He is not in acute distress.    Appearance: He is well-developed.     Comments: Appears nontoxic  HENT:     Head: Normocephalic and atraumatic.  Eyes:     Conjunctiva/sclera: Conjunctivae normal.     Pupils: Pupils are equal, round, and reactive to light.  Cardiovascular:     Rate and Rhythm: Normal rate and regular rhythm.     Pulses: Normal pulses.  Pulmonary:     Effort: Pulmonary effort is normal. No respiratory distress.     Breath sounds: No stridor. Wheezing present. No rales.     Comments: Expiratory wheezes.  Speaking in full sentences.  No respiratory distress. Chest:     Chest wall: No tenderness.  Abdominal:     General: There is no distension.     Palpations: Abdomen is soft. There is no mass.     Tenderness: There is no abdominal tenderness. There is no guarding or rebound.  Musculoskeletal:     Cervical back: Normal range of motion and neck supple.     Right lower leg: Edema present.     Left lower leg: Edema present.     Comments: 2+ pitting edema bilaterally.  Swelling of the left leg worse than the right.  Mild skin reddening of the right calf.  Good distal sensation and cap refill.  Skin:    General: Skin is warm and dry.     Capillary Refill: Capillary refill takes less than 2 seconds.  Neurological:     Mental Status: He is alert and oriented to person, place, and time.     ED Results / Procedures / Treatments   Labs (all labs ordered are listed, but only abnormal results are displayed) Labs Reviewed  CBC - Abnormal; Notable for the following components:      Result Value   RBC 4.18 (*)    Hemoglobin 12.1 (*)    HCT 38.4 (*)     All other components within normal limits  BASIC METABOLIC PANEL - Abnormal; Notable for the following components:   Glucose, Bld 124 (*)    Calcium 8.6 (*)    All other components within normal limits  BRAIN NATRIURETIC PEPTIDE  HEPATIC FUNCTION PANEL    EKG None  Radiology DG Chest 2 View  Result Date: 01/13/2020 CLINICAL DATA:  Pulmonary edema EXAM: CHEST - 2 VIEW  COMPARISON:  09/13/2018 FINDINGS: Normal heart size. Mild aortic tortuosity. There is no edema, consolidation, effusion, or pneumothorax. Mild lower lung scarring is likely, seen overlapping the heart lateral view. Thoracic kyphosis and spondylosis. IMPRESSION: No evidence of active disease. Electronically Signed   By: Monte Fantasia M.D.   On: 01/13/2020 07:50   VAS [place] LOWER EXTREMITY VENOUS (DVT) (ONLY [facility] & [facility] 7a-7p)  Result Date: 01/13/2020  Lower Venous DVTStudy Indications: Edema.  Comparison Study: no prior Performing Technologist: June Leap RDMS, RVT  Examination Guidelines: A complete evaluation includes B-mode imaging, spectral Doppler, color Doppler, and power Doppler as needed of all accessible portions of each vessel. Bilateral testing is considered an integral part of a complete examination. Limited examinations for reoccurring indications may be performed as noted. The reflux portion of the exam is performed with the patient in reverse Trendelenburg.  +---------+---------------+---------+-----------+----------+--------------+  RIGHT     Compressibility Phasicity Spontaneity Properties Thrombus Aging  +---------+---------------+---------+-----------+----------+--------------+  CFV       Full            Yes       Yes                                    +---------+---------------+---------+-----------+----------+--------------+  SFJ       Full                                                             +---------+---------------+---------+-----------+----------+--------------+  FV Prox   Full                                                              +---------+---------------+---------+-----------+----------+--------------+  FV Mid    Full                                                             +---------+---------------+---------+-----------+----------+--------------+  FV Distal Full                                                             +---------+---------------+---------+-----------+----------+--------------+  PFV       Full                                                             +---------+---------------+---------+-----------+----------+--------------+  POP       Full            Yes       Yes                                    +---------+---------------+---------+-----------+----------+--------------+  PTV       Full                                                             +---------+---------------+---------+-----------+----------+--------------+  PERO      Full                                                             +---------+---------------+---------+-----------+----------+--------------+   +---------+---------------+---------+-----------+----------+--------------+  LEFT      Compressibility Phasicity Spontaneity Properties Thrombus Aging  +---------+---------------+---------+-----------+----------+--------------+  CFV       Full            Yes       Yes                                    +---------+---------------+---------+-----------+----------+--------------+  SFJ       Full                                                             +---------+---------------+---------+-----------+----------+--------------+  FV Prox   Full                                                             +---------+---------------+---------+-----------+----------+--------------+  FV Mid    Full                                                             +---------+---------------+---------+-----------+----------+--------------+  FV Distal Full                                                              +---------+---------------+---------+-----------+----------+--------------+  PFV       Full                                                             +---------+---------------+---------+-----------+----------+--------------+  POP       Full            Yes       Yes                                    +---------+---------------+---------+-----------+----------+--------------+  PTV       Full                                                             +---------+---------------+---------+-----------+----------+--------------+  PERO      Full                                                             +---------+---------------+---------+-----------+----------+--------------+     Summary: BILATERAL: - No evidence of deep vein thrombosis seen in the lower extremities, bilaterally.  RIGHT: - No cystic structure found in the popliteal fossa. - Ultrasound characteristics of enlarged lymph nodes are noted in the groin.  LEFT: - No cystic structure found in the popliteal fossa. - Ultrasound characteristics of enlarged lymph nodes noted in the groin.  *See table(s) above for measurements and observations.    Preliminary     Procedures Procedures (including critical care time)  Medications Ordered in ED Medications  furosemide (LASIX) injection 20 mg (20 mg Intravenous Given 01/13/20 0754)    ED Course  I have reviewed the triage vital signs and the nursing notes.  Pertinent labs & imaging results that were available during my care of the patient were reviewed by me and considered in my medical decision making (see chart for details).    MDM Rules/Calculators/A&P                      Patient presented for evaluation of bilateral leg swelling.  On exam, patient has obviously edematous swelling, worse on the left.  He also has some skin discoloration on the left, as such consider DVT.  Less likely cellulitis as it is bilateral.  Also consider peripheral edema due to CHF, but patient does not have a history of  heart failure or respiratory symptoms.  Consider dependent edema.  Will obtain labs, chest x-ray, ultrasounds, and reassess.  Labs interpreted by me, overall reassuring.  BNP negative.  Electrolytes stable.  No leukocytosis.  Chest x-ray viewed and interpreted by me, no pneumonia, pneumothorax, effusion.  DVT ultrasound negative bilaterally, does show inguinal lymphadenopathy.  Patient continues without abdominal pain, and reports none The urination.  This can likely be followed with palpation.  Patient states his leg swelling and pain is improving with Lasix.  As such, will give a short course of next couple days.  Encourage patient eat a low-salt diet and wear compression socks.  As patient does not have a primary care, will consult with social work to help establish with PCP.  Discussed findings and plan with patient.  At this time, patient appears safe for discharge.  Return precautions given.  Patient states he understands and agrees to plan.  Final Clinical Impression(s) / ED Diagnoses Final diagnoses:  Peripheral edema    Rx / DC Orders ED Discharge Orders         Ordered    furosemide (LASIX) 20 MG tablet  Daily     01/13/20 1019           Adamari Frede, PA-C 01/13/20 1155    Davonna Belling, MD 01/13/20  1518 ° °

## 2020-01-13 NOTE — ED Notes (Signed)
Pt transported to vascular.  °

## 2020-01-13 NOTE — ED Triage Notes (Signed)
Pt states that for the past 3 days he has been having bilateral leg swelling, pt denies SOB or hx of CHF

## 2020-01-13 NOTE — ED Notes (Signed)
Pt d/c home per MD order. Discharge summary reviewed with pt, pt verbalizes understanding. No s/s of acute distress noted at discharge, Reports friend is d/c ride home

## 2020-01-13 NOTE — Discharge Instructions (Signed)
You may try daily Lasix to decrease your leg swelling. Eat a low-salt diet. Wear compression socks, especially she will be standing for a long time, to help with leg swelling. Use Tylenol and ibuprofen for pain as your swelling improves. Follow closely with a primary care doctor. Return to the emergency room with any new, worsening, concerning symptoms.

## 2020-01-13 NOTE — Progress Notes (Signed)
Lower venous duplex       has been completed. Preliminary results can be found under CV proc through chart review. Lyndell Gillyard, BS, RDMS, RVT   

## 2020-01-13 NOTE — ED Notes (Signed)
Pt transported to CXR via wheelchair, to be brought to rm19 after CXR

## 2020-01-27 ENCOUNTER — Encounter (HOSPITAL_COMMUNITY): Payer: Self-pay

## 2020-01-27 ENCOUNTER — Emergency Department (HOSPITAL_COMMUNITY): Payer: Self-pay

## 2020-01-27 ENCOUNTER — Other Ambulatory Visit: Payer: Self-pay

## 2020-01-27 ENCOUNTER — Emergency Department (HOSPITAL_COMMUNITY)
Admission: EM | Admit: 2020-01-27 | Discharge: 2020-01-28 | Disposition: A | Discharge status: Other Acute Inpatient Hospital | Payer: Self-pay | Attending: Emergency Medicine | Admitting: Emergency Medicine

## 2020-01-27 DIAGNOSIS — F17210 Nicotine dependence, cigarettes, uncomplicated: Secondary | ICD-10-CM | POA: Insufficient documentation

## 2020-01-27 DIAGNOSIS — Z20822 Contact with and (suspected) exposure to covid-19: Secondary | ICD-10-CM | POA: Insufficient documentation

## 2020-01-27 DIAGNOSIS — F1123 Opioid dependence with withdrawal: Secondary | ICD-10-CM

## 2020-01-27 DIAGNOSIS — F1193 Opioid use, unspecified with withdrawal: Secondary | ICD-10-CM

## 2020-01-27 DIAGNOSIS — F332 Major depressive disorder, recurrent severe without psychotic features: Secondary | ICD-10-CM | POA: Insufficient documentation

## 2020-01-27 DIAGNOSIS — R45851 Suicidal ideations: Secondary | ICD-10-CM

## 2020-01-27 DIAGNOSIS — F1113 Opioid abuse with withdrawal: Secondary | ICD-10-CM | POA: Insufficient documentation

## 2020-01-27 DIAGNOSIS — R110 Nausea: Secondary | ICD-10-CM | POA: Insufficient documentation

## 2020-01-27 DIAGNOSIS — Z85528 Personal history of other malignant neoplasm of kidney: Secondary | ICD-10-CM | POA: Insufficient documentation

## 2020-01-27 LAB — RAPID URINE DRUG SCREEN, HOSP PERFORMED
Amphetamines: NOT DETECTED
Barbiturates: NOT DETECTED
Benzodiazepines: NOT DETECTED
Cocaine: NOT DETECTED
Opiates: NOT DETECTED
Tetrahydrocannabinol: NOT DETECTED

## 2020-01-27 LAB — COMPREHENSIVE METABOLIC PANEL
ALT: 14 U/L (ref 0–44)
AST: 20 U/L (ref 15–41)
Albumin: 4.5 g/dL (ref 3.5–5.0)
Alkaline Phosphatase: 129 U/L — ABNORMAL HIGH (ref 38–126)
Anion gap: 12 (ref 5–15)
BUN: 14 mg/dL (ref 6–20)
CO2: 21 mmol/L — ABNORMAL LOW (ref 22–32)
Calcium: 9.5 mg/dL (ref 8.9–10.3)
Chloride: 104 mmol/L (ref 98–111)
Creatinine, Ser: 1.18 mg/dL (ref 0.61–1.24)
GFR calc Af Amer: >60 mL/min (ref >60)
GFR calc non Af Amer: >60 mL/min (ref >60)
Glucose, Bld: 103 mg/dL — ABNORMAL HIGH (ref 70–99)
Potassium: 4.1 mmol/L (ref 3.5–5.1)
Sodium: 137 mmol/L (ref 135–145)
Total Bilirubin: 1.2 mg/dL (ref 0.3–1.2)
Total Protein: 8.4 g/dL — ABNORMAL HIGH (ref 6.5–8.1)

## 2020-01-27 LAB — URINALYSIS, ROUTINE W REFLEX MICROSCOPIC
Bilirubin Urine: NEGATIVE
Glucose, UA: NEGATIVE mg/dL
Hgb urine dipstick: NEGATIVE
Ketones, ur: NEGATIVE mg/dL
Leukocytes,Ua: NEGATIVE
Nitrite: NEGATIVE
Protein, ur: 30 mg/dL — AB
Specific Gravity, Urine: 1.023 (ref 1.005–1.030)
pH: 6 (ref 5.0–8.0)

## 2020-01-27 LAB — CBC
HCT: 49.5 % (ref 39.0–52.0)
Hemoglobin: 16.3 g/dL (ref 13.0–17.0)
MCH: 29.2 pg (ref 26.0–34.0)
MCHC: 32.9 g/dL (ref 30.0–36.0)
MCV: 88.7 fL (ref 80.0–100.0)
Platelets: 355 10*3/uL (ref 150–400)
RBC: 5.58 MIL/uL (ref 4.22–5.81)
RDW: 13.2 % (ref 11.5–15.5)
WBC: 14.4 10*3/uL — ABNORMAL HIGH (ref 4.0–10.5)
nRBC: 0 % (ref 0.0–0.2)

## 2020-01-27 LAB — TROPONIN I (HIGH SENSITIVITY): Troponin I (High Sensitivity): 3 ng/L (ref <18)

## 2020-01-27 LAB — ETHANOL: Alcohol, Ethyl (B): <10 mg/dL (ref <10)

## 2020-01-27 MED ORDER — CLONIDINE HCL 0.1 MG PO TABS
0.1000 mg | ORAL_TABLET | Freq: Four times a day (QID) | ORAL | Status: DC
  Administered 2020-01-27 – 2020-01-28 (×2): 0.1 mg via ORAL
  Filled 2020-01-27 (×2): qty 1

## 2020-01-27 MED ORDER — SODIUM CHLORIDE 0.9% FLUSH: 3.0000 mL | Freq: Once | INTRAVENOUS | Status: DC

## 2020-01-27 MED ORDER — DICYCLOMINE HCL 20 MG PO TABS: 20.0000 mg | ORAL_TABLET | Freq: Four times a day (QID) | ORAL | Status: DC | PRN

## 2020-01-27 MED ORDER — HYDROXYZINE HCL 25 MG PO TABS: 25.0000 mg | ORAL_TABLET | Freq: Four times a day (QID) | ORAL | Status: DC | PRN

## 2020-01-27 MED ORDER — LOPERAMIDE HCL 2 MG PO CAPS: 2.0000 mg | ORAL_CAPSULE | ORAL | Status: DC | PRN

## 2020-01-27 MED ORDER — NAPROXEN 500 MG PO TABS: 500.0000 mg | ORAL_TABLET | Freq: Two times a day (BID) | ORAL | Status: DC | PRN

## 2020-01-27 MED ORDER — CLONIDINE HCL 0.1 MG PO TABS: 0.2000 mg | ORAL_TABLET | Freq: Once | ORAL | Status: DC

## 2020-01-27 MED ORDER — ONDANSETRON 4 MG PO TBDP
4.0000 mg | ORAL_TABLET | Freq: Once | ORAL | Status: AC | PRN
  Administered 2020-01-27: 4 mg via ORAL
  Filled 2020-01-27: qty 1

## 2020-01-27 MED ORDER — ONDANSETRON 4 MG PO TBDP
4.0000 mg | ORAL_TABLET | Freq: Four times a day (QID) | ORAL | Status: DC | PRN
  Administered 2020-01-27 – 2020-01-28 (×2): 4 mg via ORAL
  Filled 2020-01-27 (×2): qty 1

## 2020-01-27 MED ORDER — METHOCARBAMOL 500 MG PO TABS: 500.0000 mg | ORAL_TABLET | Freq: Three times a day (TID) | ORAL | Status: DC | PRN

## 2020-01-27 MED ORDER — CLONIDINE HCL 0.1 MG PO TABS: 0.1000 mg | ORAL_TABLET | ORAL | Status: DC

## 2020-01-27 MED ORDER — CLONIDINE HCL 0.1 MG PO TABS: 0.1000 mg | ORAL_TABLET | Freq: Every day | ORAL | Status: DC

## 2020-01-27 NOTE — ED Notes (Signed)
Pt requested medication for nausea.

## 2020-01-27 NOTE — Discharge Summary (Signed)
  Patient to be transferred to [facility] OBS unit once labs completed

## 2020-01-27 NOTE — ED Provider Notes (Signed)
[facility] DEPT Provider Note   CSN: [id number] Arrival date & time: 01/27/20  [id number]     History Chief Complaint  Patient presents with  . Suicidal  . detox  . Chest Pain    Joshua Lewis is a 59 y.o. male.  The history is provided by the patient.  Mental Health Problem Presenting symptoms: suicidal thoughts   Degree of incapacity (severity):  Mild Onset quality:  Gradual Timing:  Constant Progression:  Unchanged Context: drug abuse   Relieved by:  Nothing Worsened by:  Drugs Associated symptoms: chest pain   Associated symptoms: no abdominal pain   Risk factors: no hx of mental illness        Past Medical History:  Diagnosis Date  . Chronic back pain greater than 3 months duration   . Kidney carcinoma ([place])     There are no problems to display for this patient.   Past Surgical History:  Procedure Laterality Date  . HAND SURGERY    . HERNIA REPAIR    . NEPHRECTOMY     right       Family History  Problem Relation Age of Onset  . Cancer Mother   . Diabetes Mother   . Hypertension Mother     Social History   Tobacco Use  . Smoking status: Current Some Day Smoker    Packs/day: 0.50  . Smokeless tobacco: Never Used  Substance Use Topics  . Alcohol use: No  . Drug use: No    Home Medications Prior to Admission medications   Medication Sig Start Date End Date Taking? Authorizing Provider  cephALEXin (KEFLEX) 500 MG capsule Take 2 capsules (1,000 mg total) by mouth 2 (two) times daily. Patient not taking: Reported on 01/13/2020 12/14/19   Dalia Heading, PA-C  clotrimazole-betamethasone (LOTRISONE) cream Apply to penile area 2 times daily prn Patient not taking: Reported on 01/13/2020 12/14/19   Dalia Heading, PA-C  diphenhydrAMINE-zinc acetate (BENADRYL ITCH STOPPING) cream Apply topically 3 (three) times daily as needed for itching. Patient not taking: Reported on 01/13/2020 03/28/19   Couture, Cortni S, PA-C    EPINEPHrine 0.3 mg/0.3 mL IJ SOAJ injection Inject 0.3 mLs (0.3 mg total) into the muscle as needed for up to 1 dose for anaphylaxis. Patient not taking: Reported on 01/27/2020 03/28/19   Couture, Cortni S, PA-C  famotidine (PEPCID) 20 MG tablet Take 1 tablet (20 mg total) by mouth 2 (two) times daily for 5 days. Patient not taking: Reported on 01/27/2020 03/28/19 04/02/19  Couture, Cortni S, PA-C  furosemide (LASIX) 20 MG tablet Take 1 tablet (20 mg total) by mouth daily for 5 days. Patient not taking: Reported on 01/27/2020 01/13/20 01/18/20  Caccavale, Sophia, PA-C  HYDROcodone-acetaminophen (NORCO/VICODIN) 5-325 MG tablet Take 1 tablet by mouth every 6 (six) hours as needed. Patient not taking: Reported on 01/13/2020 09/13/18   Quintella Reichert, MD  ibuprofen (ADVIL,MOTRIN) 600 MG tablet Take 1 tablet (600 mg total) by mouth every 8 (eight) hours as needed. Patient not taking: Reported on 01/13/2020 09/13/18   Quintella Reichert, MD  loratadine (CLARITIN) 10 MG tablet Take 1 tablet (10 mg total) by mouth daily for 5 days. Patient not taking: Reported on 01/27/2020 03/28/19 04/02/19  Couture, Cortni S, PA-C  methocarbamol (ROBAXIN) 500 MG tablet Take 1-2 tablets (500-1,000 mg total) by mouth 2 (two) times daily. Patient not taking: Reported on 01/13/2020 09/13/18   Quintella Reichert, MD  predniSONE (DELTASONE) 50 MG tablet Take 1 tablet (50 mg  total) by mouth daily. Patient not taking: Reported on 01/13/2020 12/14/19   Dalia Heading, PA-C  Ibuprofen-Diphenhydramine HCl (ADVIL PM) 200-25 MG CAPS Take 1 tablet by mouth at bedtime. To help sleep  11/25/11  [provider]    Allergies    Patient has no known allergies.  Review of Systems   Review of Systems  Constitutional: Negative for chills and fever.  HENT: Negative for ear pain and sore throat.   Eyes: Negative for pain and visual disturbance.  Respiratory: Negative for cough and shortness of breath.   Cardiovascular: Positive for chest pain. Negative  for palpitations.  Gastrointestinal: Positive for nausea. Negative for abdominal pain and vomiting.  Genitourinary: Negative for dysuria and hematuria.  Musculoskeletal: Positive for myalgias. Negative for arthralgias and back pain.  Skin: Negative for color change and rash.  Neurological: Positive for tremors. Negative for seizures and syncope.  Psychiatric/Behavioral: Positive for suicidal ideas.  All other systems reviewed and are negative.   Physical Exam Updated Vital Signs  ED Triage Vitals  Enc Vitals Group     BP 01/27/20 1009 134/82     Pulse Rate 01/27/20 1009 79     Resp 01/27/20 1009 (!) 23     Temp 01/27/20 1009 98 F (36.7 C)     Temp Source 01/27/20 1009 Oral     SpO2 01/27/20 1009 98 %     Weight 01/27/20 1009 170 lb (77.1 kg)     Height 01/27/20 1009 5\' 9"  (1.753 m)     Head Circumference --      Peak Flow --      Pain Score 01/27/20 1020 7     Pain Loc --      Pain Edu? --      Excl. in [place]? --     Physical Exam Vitals and nursing note reviewed.  Constitutional:      General: He is not in acute distress.    Appearance: He is well-developed. He is not ill-appearing.  HENT:     Head: Normocephalic and atraumatic.  Eyes:     Extraocular Movements: Extraocular movements intact.     Conjunctiva/sclera: Conjunctivae normal.     Pupils: Pupils are equal, round, and reactive to light.  Cardiovascular:     Rate and Rhythm: Normal rate and regular rhythm.     Pulses:          Carotid pulses are 2+ on the right side and 2+ on the left side.      Radial pulses are 2+ on the right side and 2+ on the left side.     Heart sounds: No murmur.  Pulmonary:     Effort: Pulmonary effort is normal. No respiratory distress.     Breath sounds: Normal breath sounds. No decreased breath sounds or wheezing.  Abdominal:     Palpations: Abdomen is soft.     Tenderness: There is no abdominal tenderness.  Musculoskeletal:     Cervical back: Neck supple.  Skin:    General:  Skin is warm and dry.  Neurological:     Mental Status: He is alert.  Psychiatric:        Attention and Perception: Attention normal.        Mood and Affect: Mood is anxious.        Behavior: Behavior is agitated.        Thought Content: Thought content includes suicidal ideation. Thought content does not include homicidal or suicidal plan.  ED Results / Procedures / Treatments   Labs (all labs ordered are listed, but only abnormal results are displayed) Labs Reviewed  CBC - Abnormal; Notable for the following components:      Result Value   WBC 14.4 (*)    All other components within normal limits  COMPREHENSIVE METABOLIC PANEL - Abnormal; Notable for the following components:   CO2 21 (*)    Glucose, Bld 103 (*)    Total Protein 8.4 (*)    Alkaline Phosphatase 129 (*)    All other components within normal limits  SARS CORONAVIRUS 2 BY RT PCR (HOSPITAL ORDER, [facility])  ETHANOL  RAPID URINE DRUG SCREEN, HOSP PERFORMED  TROPONIN I (HIGH SENSITIVITY)    EKG EKG Interpretation  Date/Time:  Tuesday Jan 27 2020 10:14:26 EDT Ventricular Rate:  80 PR Interval:    QRS Duration: 75 QT Interval:  365 QTC Calculation: 421 R Axis:   64 Text Interpretation: Sinus rhythm Biatrial enlargement 12 Lead; [place] Confirmed by Lennice Sites [phone]) on 01/27/2020 11:23:35 AM   Radiology DG Chest 2 View  Result Date: 01/27/2020 CLINICAL DATA:  Chest pain EXAM: CHEST - 2 VIEW COMPARISON:  01/13/2020 FINDINGS: The heart size and mediastinal contours are within normal limits. Both lungs are clear. Mild degenerative endplate changes within the midthoracic spine. The visualized skeletal structures are otherwise unremarkable. IMPRESSION: No active cardiopulmonary disease. Electronically Signed   By: Davina Poke D.O.   On: 01/27/2020 11:11    Procedures Procedures (including critical care time)  Medications Ordered in ED Medications  sodium chloride  flush (NS) 0.9 % injection 3 mL (has no administration in time range)  sodium chloride flush (NS) 0.9 % injection 3 mL (has no administration in time range)  dicyclomine (BENTYL) tablet 20 mg (has no administration in time range)  hydrOXYzine (ATARAX/VISTARIL) tablet 25 mg (has no administration in time range)  loperamide (IMODIUM) capsule 2-4 mg (has no administration in time range)  methocarbamol (ROBAXIN) tablet 500 mg (has no administration in time range)  naproxen (NAPROSYN) tablet 500 mg (has no administration in time range)  ondansetron (ZOFRAN-ODT) disintegrating tablet 4 mg (has no administration in time range)  cloNIDine (CATAPRES) tablet 0.1 mg (has no administration in time range)    Followed by  cloNIDine (CATAPRES) tablet 0.1 mg (has no administration in time range)    Followed by  cloNIDine (CATAPRES) tablet 0.1 mg (has no administration in time range)  ondansetron (ZOFRAN-ODT) disintegrating tablet 4 mg (4 mg Oral Given 01/27/20 1032)    ED Course  I have reviewed the triage vital signs and the nursing notes.  Pertinent labs & imaging results that were available during my care of the patient were reviewed by me and considered in my medical decision making (see chart for details).    MDM Rules/Calculators/A&P                      Joshua Lewis is a 59 year old male with history of chronic back pain, kidney carcinoma who presents to the ED with suicidal ideation, opioid withdrawal.  Patient with normal vitals.  No fever.  Patient endorsing suicidal ideation but no plan.  Voluntarily here.  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  States that he does not think he can do it and that he might kill himself.  Patient states that he has not had any fentanyl for the last 3 days.  We will give him  clonidine and other antiemetics to help with opioid withdrawal symptoms.  Will touch base with peers support and psychiatry.  He thinks that he would benefit from a mental health  hospital stay.  Will continue medical clearance.  He has had some chest pain.  Denies any cocaine abuse.  No cardiac risk factors.  No current chest pain.  EKG shows sinus rhythm.  No ischemic changes.  Troponin normal.  Doubt ACS.  Likely symptoms secondary to opioid withdrawal.  However patient otherwise appears medically stable and cleared.  Normal vitals.  No significant anemia, electrolyte abnormality, kidney injury.  We will have him evaluated by TTS.  Peers support has also been consulted.  Awaiting psychiatric evaluation.  If patient cleared he would benefit from clonidine and Zofran for opiate withdrawal.  This chart was dictated using voice recognition software.  Despite best efforts to proofread,  errors can occur which can change the documentation meaning.   Final Clinical Impression(s) / ED Diagnoses Final diagnoses:  Suicidal ideation  Opioid withdrawal [facility])    Rx / DC Orders ED Discharge Orders    None       Lennice Sites, DO 01/27/20 1314

## 2020-01-27 NOTE — ED Notes (Signed)
Peer Support reports he has met with the patient and completed the consult.

## 2020-01-27 NOTE — Patient Outreach (Signed)
ED Peer Support Specialist Patient Intake (Complete at intake & 30-60 Day Follow-up)  Name: Joshua Lewis  MRN: [id number]  Age: 59 y.o.   Date of Admission: 01/27/2020  Intake: Initial Comments:      Primary Reason Admitted: Suicidal(+   Lab values: Alcohol/ETOH: Positive Positive UDS? Yes Amphetamines: No Barbiturates: No Benzodiazepines: No Cocaine: No Opiates: No Cannabinoids: No  Demographic information: Gender: Male Ethnicity: African American Marital Status: Married Insurance Status: Engineer, site (Work Neurosurgeon, Physicist, medical, etc.: No Lives with: Partner/Spouse Living situation: House/Apartment  Reported Patient History: Patient reported health conditions: Depression, Anxiety disorders Patient aware of HIV and hepatitis status: No  In past year, has patient visited ED for any reason? No  Number of ED visits:    Reason(s) for visit:    In past year, has patient been hospitalized for any reason? No  Number of hospitalizations:    Reason(s) for hospitalization:    In past year, has patient been arrested? No  Number of arrests:    Reason(s) for arrest:    In past year, has patient been incarcerated? No  Number of incarcerations:    Reason(s) for incarceration:    In past year, has patient received medication-assisted treatment?    In past year, patient received the following treatments:    In past year, has patient received any harm reduction services? No  Did this include any of the following?    In past year, has patient received care from a mental health provider for diagnosis other than SUD? No  In past year, is this first time patient has overdosed? No  Number of past overdoses:    In past year, is this first time patient has been hospitalized for an overdose? No  Number of hospitalizations for overdose(s):    Is patient currently receiving treatment for a mental health diagnosis? No  Patient reports  experiencing difficulty participating in SUD treatment: No    Most important reason(s) for this difficulty?    Has patient received prior services for treatment? No  In past, patient has received services from following agencies:    Plan of Care:  Suggested follow up at these agencies/treatment centers:    Other information: CPSS spoke with Pt about seeking treatment at [facility] facilities. CPSS was made aware that Pt needed to first go to a substance abuse facility first. CPSS was issues contact information for [facility], an are waiting for a return call from intake team for phone assessment.    [name], CPSS  01/27/2020 11:33 AM

## 2020-01-27 NOTE — Discharge Instructions (Addendum)
If d/c consider meds for opioid withdrawal/rehab resources

## 2020-01-27 NOTE — Patient Outreach (Signed)
CPSS was able to link Pt with [facility]. Pt at this time needs to get to [facility] or contact them for a referral for [facility]. Pt already completed the Ph assessment an are waiting for discharge so that he get to [place] and to [facility] for treatment.

## 2020-01-27 NOTE — ED Triage Notes (Addendum)
Per EMs- Patient is from home. Patient states he is suicidal with no plan. Patient is requesting detox and states he has not had Fentanyl in 3 days. Patient also c/o chest pain  Patient c/o nausea

## 2020-01-27 NOTE — BH Assessment (Signed)
Tele Assessment Note   Patient Name: Joshua Lewis MRN: [id number] Referring Physician: Lennice Sites, MD Location of Patient: [facility] Location of Provider: [facility]  Owens Remaley is an 59 y.o. male with a history of Opioid Use Disorder who presents voluntarily via EMS endorsing SI secondary to Opioid withdrawal.  Patient stats he has been trying to stop on his own after 6 months of daily use.  He states he was introduced to Fentanyl 6 months ago, by friends who convinced him it would help improve his self-esteem.  Patient states there is no truth to this claim and he has tried on several occasions to stop using on his own.   Patient is distraught as he is adamant that he cannot manage the withdrawals and wants to die.  He begins to cry, shaking his head, throwing off glasses in agony stating repeatedly that he just wants "to die.  I don't want to do it anymore.  I can't live anymore."  Patient continues to endorse SI and has a current plan to "run out right into traffic" if he were to be released.  He has a history of 6 attempts by cutting within the past year.  Current significant stressors are related to what he has lost as a result of using fentanyl for th epast 6 months.  He states he lost one of the only jobs he has really enjoyed, working at [employer] in [employer].  Patient denies HI and AVH.  He states he has no appetite and has not eaten or had any thing to drink in days.    Patient gave verbal consent for LPC to contact his wife, Louise([phone]).  She states they have lost so much recently due to patient's substance use and she is struggling to cope also.  She confirms patient has tried to discontinue fentanyl use on his own, but has been "so sick."  She called EMS when she didn't know how to help him.  She is concerned for patient's safety, given his past attempts and current devastation over all "we have lost."  She is supportive of the recommendation for inpatient  treatment.  Patient is calm, cooperative and behaviorally appropriate.  Patient is casually dressed.  Speech is soft.  Eye contact is minimal.  Patient's mood is depressed.  Affect is congruent with mood.  Thought process is coherent and relevant.  There is no indication patient is responding to internal stimuli or experiencing delusional thought content.  Judgement and insight are limited.   Per Earleen Newport, NP inpatient treatment is recommended.  Patient will transfer to OBS pending bed availability.  TTS to pursue appropriate inpatient options.  Patient is voluntary for treatment.    Diagnosis: Major Depressive Disorder, recurrent, severe                    Opioid Use Disorder  Past Medical History:  Past Medical History:  Diagnosis Date  . Chronic back pain greater than 3 months duration   . Kidney carcinoma [facility])     Past Surgical History:  Procedure Laterality Date  . HAND SURGERY    . HERNIA REPAIR    . NEPHRECTOMY     right    Family History:  Family History  Problem Relation Age of Onset  . Cancer Mother   . Diabetes Mother   . Hypertension Mother     Social History:  reports that he has been smoking. He has been smoking about 0.50 packs per  day. He has never used smokeless tobacco. He reports that he does not drink alcohol or use drugs.  Additional Social History:  Alcohol / Drug Use Pain Medications: See MAR Prescriptions: See MAR Over the Counter: See MAR History of alcohol / drug use?: Yes Longest period of sobriety (when/how long): N/A Substance #1 Name of Substance 1: Fentanyl 1 - Age of First Use: 58 1 - Amount (size/oz): snorts "amount of a crushed pill" dosage unknown 1 - Frequency: daily 1 - Duration: 6 months 1 - Last Use / Amount: two days ago - dosage of crushed pill unknown  CIWA: CIWA-Ar BP: 116/81 Pulse Rate: 99 COWS:    Allergies: No Known Allergies  Home Medications: (Not in a hospital admission)   OB/GYN Status:  No LMP for male  patient.  General Assessment Data Location of Assessment: [facility] TTS Assessment: In system Is this a Tele or Face-to-Face Assessment?: Face-to-Face Is this an Initial Assessment or a Re-assessment for this encounter?: Initial Assessment Patient Accompanied by:: N/A Language Other than English: No Living Arrangements: (private home) What gender do you identify as?: Male Marital status: Married Pharmacist, community name: N/A Pregnancy Status: No Living Arrangements: Spouse/significant other(lives with wife and 21 year old son) Can pt return to current living arrangement?: Yes Admission Status: Voluntary Is patient capable of signing voluntary admission?: Yes Referral Source: Self/Family/Friend Insurance type: VA Benefits     Crisis Care Plan Living Arrangements: Spouse/significant other(lives with wife and 43 year old son) Legal Guardian: Other:(self) Name of Psychiatrist: None Name of Therapist: None  Education Status Is patient currently in school?: No Is the patient employed, unemployed or receiving disability?: Unemployed  Risk to self with the past 6 months Suicidal Ideation: Yes-Currently Present Has patient been a risk to self within the past 6 months prior to admission? : Yes Suicidal Intent: Yes-Currently Present Has patient had any suicidal intent within the past 6 months prior to admission? : Yes Is patient at risk for suicide?: Yes Suicidal Plan?: Yes-Currently Present Has patient had any suicidal plan within the past 6 months prior to admission? : Yes Specify Current Suicidal Plan: walk "right out of her into traffic" Access to Means: Yes Specify Access to Suicidal Means: sharps - hx of cutting as attempt 1 yr ago What has been your use of drugs/alcohol within the last 12 months?: fentanyl use Previous Attempts/Gestures: Yes How many times?: 6 Other Self Harm Risks: lost job, withdrawals, shame Triggers for Past Attempts: Other personal contacts("friends" introduced him to  fentanyl ) Intentional Self Injurious Behavior: Cutting(hx of cutting 1 yr ago - attempt) Comment - Self Injurious Behavior: hx of cutting - suicidal intent Family Suicide History: No Recent stressful life event(s): Other (Comment)(devastated by drug use and all he has lost d/t use) Persecutory voices/beliefs?: No Depression: Yes Depression Symptoms: Despondent, Tearfulness, Feeling angry/irritable, Feeling worthless/self pity, Guilt, Loss of interest in usual pleasures Substance abuse history and/or treatment for substance abuse?: Yes Suicide prevention information given to non-admitted patients: Not applicable  Risk to Others within the past 6 months Homicidal Ideation: No Does patient have any lifetime risk of violence toward others beyond the six months prior to admission? : No Thoughts of Harm to Others: No Current Homicidal Intent: No Current Homicidal Plan: No Access to Homicidal Means: No Identified Victim: N/A History of harm to others?: No Assessment of Violence: None Noted Violent Behavior Description: N/A Does patient have access to weapons?: No Criminal Charges Pending?: No Does patient have a court  date: No Is patient on probation?: No  Psychosis Hallucinations: None noted Delusions: None noted  Mental Status Report Appearance/Hygiene: Unremarkable Eye Contact: Poor Motor Activity: Restlessness Speech: Logical/coherent Level of Consciousness: Alert Mood: Depressed, Sad Affect: Flat, Sad Anxiety Level: Minimal Thought Processes: Coherent, Relevant Judgement: Partial Orientation: Person, Place, Time, Situation Obsessive Compulsive Thoughts/Behaviors: None  Cognitive Functioning Concentration: Decreased Memory: Recent Intact, Remote Intact Is patient IDD: No Insight: Poor Impulse Control: Fair  ADLScreening [facility] Assessment Services) Patient's cognitive ability adequate to safely complete daily activities?: Yes Patient able to express need for  assistance with ADLs?: Yes Independently performs ADLs?: Yes (appropriate for developmental age)  Prior Inpatient Therapy Prior Inpatient Therapy: Yes Prior Therapy Dates: 1999 Prior Therapy Facilty/Provider(s): [place] Reason for Treatment: Depression  Prior Outpatient Therapy Prior Outpatient Therapy: No Does patient have an ACCT team?: No Does patient have Intensive In-House Services?  : No Does patient have [facility] services? : No Does patient have P4CC services?: No  ADL Screening (condition at time of admission) Patient's cognitive ability adequate to safely complete daily activities?: Yes Is the patient deaf or have difficulty hearing?: No Does the patient have difficulty concentrating, remembering, or making decisions?: Yes Patient able to express need for assistance with ADLs?: Yes Does the patient have difficulty dressing or bathing?: No Independently performs ADLs?: Yes (appropriate for developmental age) Does the patient have difficulty walking or climbing stairs?: No Weakness of Legs: None Weakness of Arms/Hands: None  Home Assistive Devices/Equipment Home Assistive Devices/Equipment: None  Therapy Consults (therapy consults require a physician order) PT Evaluation Needed: No OT Evalulation Needed: No SLP Evaluation Needed: No Abuse/Neglect Assessment (Assessment to be complete while patient is alone) Abuse/Neglect Assessment Can Be Completed: Yes Physical Abuse: Denies Verbal Abuse: Denies Sexual Abuse: Denies Exploitation of patient/patient's resources: Denies Self-Neglect: Denies Values / Beliefs Cultural Requests During Hospitalization: None Spiritual Requests During Hospitalization: None Consults Spiritual Care Consult Needed: No Transition of Care Team Consult Needed: No Advance Directives (For Healthcare) Does Patient Have a Medical Advance Directive?: No Would patient like information on creating a medical advance directive?: No - Patient  declined    Disposition: Per Chevon [name], NP inpatient treatment is recommended.  Patient will transfer to OBS pending bed availability.  TTS to pursue appropriate inpatient options.  Patient is voluntary for treatment.    Disposition Initial Assessment Completed for this Encounter: Yes Patient referred to: Other (Comment)(TBD)  This service was provided via telemedicine using a 2-way, interactive audio and video technology.  Names of all persons participating in this telemedicine service and their role in this encounter. Name: Marrell Trucks Role: Pt  Name: Laurell Roof, [facility] Role: TTS Therapist  Name: Earleen Newport, NP Role: TTS Provider    Fransico Meadow 01/27/2020 5:59 PM

## 2020-01-28 ENCOUNTER — Encounter (HOSPITAL_COMMUNITY): Payer: Self-pay | Admitting: Registered Nurse

## 2020-01-28 ENCOUNTER — Inpatient Hospital Stay (HOSPITAL_COMMUNITY)
Admission: AD | Admit: 2020-01-28 | Discharge: 2020-02-02 | DRG: 881 | Disposition: A | Discharge status: Home / Self Care | Payer: Federal, State, Local not specified - Other | Source: Intra-hospital | Attending: Psychiatry | Admitting: Psychiatry

## 2020-01-28 ENCOUNTER — Other Ambulatory Visit: Payer: Self-pay

## 2020-01-28 DIAGNOSIS — Z85528 Personal history of other malignant neoplasm of kidney: Secondary | ICD-10-CM

## 2020-01-28 DIAGNOSIS — Z560 Unemployment, unspecified: Secondary | ICD-10-CM

## 2020-01-28 DIAGNOSIS — F329 Major depressive disorder, single episode, unspecified: Secondary | ICD-10-CM | POA: Diagnosis present

## 2020-01-28 DIAGNOSIS — Z905 Acquired absence of kidney: Secondary | ICD-10-CM | POA: Diagnosis not present

## 2020-01-28 DIAGNOSIS — F1123 Opioid dependence with withdrawal: Secondary | ICD-10-CM | POA: Diagnosis present

## 2020-01-28 DIAGNOSIS — R45851 Suicidal ideations: Secondary | ICD-10-CM | POA: Diagnosis present

## 2020-01-28 DIAGNOSIS — G4700 Insomnia, unspecified: Secondary | ICD-10-CM | POA: Diagnosis present

## 2020-01-28 DIAGNOSIS — M549 Dorsalgia, unspecified: Secondary | ICD-10-CM | POA: Diagnosis present

## 2020-01-28 DIAGNOSIS — F322 Major depressive disorder, single episode, severe without psychotic features: Secondary | ICD-10-CM | POA: Diagnosis present

## 2020-01-28 DIAGNOSIS — F1124 Opioid dependence with opioid-induced mood disorder: Secondary | ICD-10-CM

## 2020-01-28 DIAGNOSIS — F17210 Nicotine dependence, cigarettes, uncomplicated: Secondary | ICD-10-CM | POA: Diagnosis present

## 2020-01-28 DIAGNOSIS — F419 Anxiety disorder, unspecified: Secondary | ICD-10-CM | POA: Diagnosis present

## 2020-01-28 DIAGNOSIS — G8929 Other chronic pain: Secondary | ICD-10-CM | POA: Diagnosis present

## 2020-01-28 DIAGNOSIS — F1120 Opioid dependence, uncomplicated: Secondary | ICD-10-CM | POA: Diagnosis present

## 2020-01-28 LAB — LIPID PANEL
Cholesterol: 204 mg/dL — ABNORMAL HIGH (ref 0–200)
HDL: 36 mg/dL — ABNORMAL LOW (ref >40)
LDL Cholesterol: 154 mg/dL — ABNORMAL HIGH (ref 0–99)
Total CHOL/HDL Ratio: 5.7 RATIO
Triglycerides: 68 mg/dL (ref <150)
VLDL: 14 mg/dL (ref 0–40)

## 2020-01-28 LAB — TSH: TSH: 1.317 u[IU]/mL (ref 0.350–4.500)

## 2020-01-28 LAB — SARS CORONAVIRUS 2 BY RT PCR (HOSPITAL ORDER, PERFORMED IN ~~LOC~~ HOSPITAL LAB): SARS Coronavirus 2: NEGATIVE

## 2020-01-28 MED ORDER — CLONIDINE HCL 0.1 MG PO TABS
0.1000 mg | ORAL_TABLET | Freq: Four times a day (QID) | ORAL | Status: AC
  Administered 2020-01-28 – 2020-01-29 (×4): 0.1 mg via ORAL
  Filled 2020-01-28 (×5): qty 1

## 2020-01-28 MED ORDER — LOPERAMIDE HCL 2 MG PO CAPS
2.0000 mg | ORAL_CAPSULE | ORAL | Status: AC | PRN
  Administered 2020-01-28: 4 mg via ORAL
  Administered 2020-01-29 (×3): 2 mg via ORAL
  Filled 2020-01-28 (×2): qty 1
  Filled 2020-01-28: qty 2
  Filled 2020-01-28: qty 1

## 2020-01-28 MED ORDER — ONDANSETRON 4 MG PO TBDP
4.0000 mg | ORAL_TABLET | Freq: Four times a day (QID) | ORAL | Status: AC | PRN
  Administered 2020-01-28 – 2020-02-01 (×8): 4 mg via ORAL
  Filled 2020-01-28 (×9): qty 1

## 2020-01-28 MED ORDER — NAPROXEN 500 MG PO TABS
500.0000 mg | ORAL_TABLET | Freq: Two times a day (BID) | ORAL | Status: AC | PRN
  Administered 2020-01-29 – 2020-02-01 (×2): 500 mg via ORAL
  Filled 2020-01-28 (×2): qty 1

## 2020-01-28 MED ORDER — METHOCARBAMOL 500 MG PO TABS
500.0000 mg | ORAL_TABLET | Freq: Three times a day (TID) | ORAL | Status: AC | PRN
  Administered 2020-01-28 – 2020-01-31 (×3): 500 mg via ORAL
  Filled 2020-01-28 (×4): qty 1

## 2020-01-28 MED ORDER — ALUM & MAG HYDROXIDE-SIMETH 200-200-20 MG/5ML PO SUSP: 30.0000 mL | ORAL | Status: DC | PRN

## 2020-01-28 MED ORDER — HYDROXYZINE HCL 25 MG PO TABS
25.0000 mg | ORAL_TABLET | Freq: Four times a day (QID) | ORAL | Status: AC | PRN
  Administered 2020-01-28 – 2020-01-31 (×5): 25 mg via ORAL
  Filled 2020-01-28 (×7): qty 1

## 2020-01-28 MED ORDER — DICYCLOMINE HCL 20 MG PO TABS
20.0000 mg | ORAL_TABLET | Freq: Four times a day (QID) | ORAL | Status: AC | PRN
  Administered 2020-01-29 – 2020-01-31 (×3): 20 mg via ORAL
  Filled 2020-01-28 (×4): qty 1

## 2020-01-28 MED ORDER — ACETAMINOPHEN 325 MG PO TABS: 650.0000 mg | ORAL_TABLET | Freq: Four times a day (QID) | ORAL | Status: DC | PRN

## 2020-01-28 MED ORDER — MAGNESIUM HYDROXIDE 400 MG/5ML PO SUSP: 30.0000 mL | Freq: Every day | ORAL | Status: DC | PRN

## 2020-01-28 MED ORDER — TRAZODONE HCL 50 MG PO TABS
50.0000 mg | ORAL_TABLET | Freq: Once | ORAL | Status: AC
  Administered 2020-01-28: 50 mg via ORAL
  Filled 2020-01-28 (×2): qty 1

## 2020-01-28 MED ORDER — CLONIDINE HCL 0.1 MG PO TABS
0.1000 mg | ORAL_TABLET | Freq: Every day | ORAL | Status: AC
  Administered 2020-02-01 – 2020-02-02 (×2): 0.1 mg via ORAL
  Filled 2020-01-28 (×2): qty 1

## 2020-01-28 MED ORDER — CLONIDINE HCL 0.1 MG PO TABS
0.1000 mg | ORAL_TABLET | ORAL | Status: AC
  Administered 2020-01-29 – 2020-01-31 (×4): 0.1 mg via ORAL
  Filled 2020-01-28 (×5): qty 1

## 2020-01-28 NOTE — ED Notes (Signed)
Report given to [place] and [facility] called for transportation to [facility]

## 2020-01-28 NOTE — H&P (Signed)
Psychiatric Admission Assessment Adult  Patient Identification: Joshua Lewis MRN:  [id number] Date of Evaluation:  01/28/2020 Chief Complaint:  MDD (major depressive disorder), severe ([place]) [F32.2] Opiate dependence ([place]) [F11.20] Principal Diagnosis: <principal problem not specified> Diagnosis:  Active Problems:   MDD (major depressive disorder), severe ([place])   Opiate dependence ([place])  History of Present Illness: Patient is seen and examined.  Patient is a 59 year old male with a past psychiatric history significant for opiate dependence and opiate withdrawal who presented to the [facility] emergency department on 01/27/2020 with suicidal ideation.  The patient stated that he has a longstanding history of opiate dependence.  He has been using opiates since around 2010.  He started after he had had back surgery.  Most recently he is going to use fentanyl approximately 6 months ago.  Unfortunately this continued and he did poorly.  Most recently he lost his job, and has attempted to quit using fentanyl but has not been successful.  His wife is having significant stress over this, and stated that he cannot return home until he gets his drug issues under control.  The patient admitted to helplessness, hopelessness and worthlessness.  He admitted to suicidal ideation as well as opiate withdrawal.  He was admitted to the hospital for evaluation and stabilization.  Associated Signs/Symptoms: Depression Symptoms:  depressed mood, anhedonia, insomnia, psychomotor agitation, fatigue, feelings of worthlessness/guilt, difficulty concentrating, hopelessness, suicidal thoughts without plan, anxiety, loss of energy/fatigue, (Hypo) Manic Symptoms:  Impulsivity, Irritable Mood, Labiality of Mood, Anxiety Symptoms:  Excessive Worry, Psychotic Symptoms:  Denied PTSD Symptoms: Negative Total Time spent with patient: 30 minutes  Past Psychiatric History: Patient denied any previous  psychiatric admissions, psychiatric medications or psychiatric evaluations.  He stated he had tried to kill himself several months ago, but did not seek assistance for that afterwards.  Is the patient at risk to self? Yes.    Has the patient been a risk to self in the past 6 months? Yes.    Has the patient been a risk to self within the distant past? No.  Is the patient a risk to others? No.  Has the patient been a risk to others in the past 6 months? No.  Has the patient been a risk to others within the distant past? No.   Prior Inpatient Therapy:   Prior Outpatient Therapy:    Alcohol Screening: 1. How often do you have a drink containing alcohol?: Monthly or less 2. How many drinks containing alcohol do you have on a typical day when you are drinking?: 1 or 2 3. How often do you have six or more drinks on one occasion?: Less than monthly AUDIT-C Score: 2 4. How often during the last year have you found that you were not able to stop drinking once you had started?: Less than monthly 5. How often during the last year have you failed to do what was normally expected from you because of drinking?: Never 6. How often during the last year have you needed a first drink in the morning to get yourself going after a heavy drinking session?: Never 7. How often during the last year have you had a feeling of guilt of remorse after drinking?: Never 8. How often during the last year have you been unable to remember what happened the night before because you had been drinking?: Never 9. Have you or someone else been injured as a result of your drinking?: No 10. Has a relative or friend or  a doctor or another health worker been concerned about your drinking or suggested you cut down?: No Alcohol Use Disorder Identification Test Final Score (AUDIT): 3 Alcohol Brief Interventions/Follow-up: AUDIT Score <7 follow-up not indicated Substance Abuse History in the last 12 months:  Yes.   Consequences of  Substance Abuse: Medical Consequences:  Clearly influencing this admission. Family Consequences:  It sounds as though his wife is quite frustrated with his substance issues, and he may not be able to return there until he is clean. Withdrawal Symptoms:   Cramps Diaphoresis Diarrhea Headaches Nausea Tremors Vomiting Previous Psychotropic Medications: No  Psychological Evaluations: No  Past Medical History:  Past Medical History:  Diagnosis Date  . Chronic back pain greater than 3 months duration   . Kidney carcinoma [facility])     Past Surgical History:  Procedure Laterality Date  . HAND SURGERY    . HERNIA REPAIR    . NEPHRECTOMY     right   Family History:  Family History  Problem Relation Age of Onset  . Cancer Mother   . Diabetes Mother   . Hypertension Mother    Family Psychiatric  History: Noncontributory Tobacco Screening:   Social History:  Social History   Substance and Sexual Activity  Alcohol Use No     Social History   Substance and Sexual Activity  Drug Use No    Additional Social History:                           Allergies:  No Known Allergies Lab Results:  Results for orders placed or performed during the hospital encounter of 01/28/20 (from the past 48 hour(s))  Lipid panel     Status: Abnormal   Collection Time: 01/28/20  3:42 AM  Result Value Ref Range   Cholesterol 204 (H) 0 - 200 mg/dL   Triglycerides 68 <150 mg/dL   HDL 36 (L) >40 mg/dL   Total CHOL/HDL Ratio 5.7 RATIO   VLDL 14 0 - 40 mg/dL   LDL Cholesterol 154 (H) 0 - 99 mg/dL    Comment:        Total Cholesterol/HDL:CHD Risk Coronary Heart Disease Risk Table                     Men   Women  1/2 Average Risk   3.4   3.3  Average Risk       5.0   4.4  2 X Average Risk   9.6   7.1  3 X Average Risk  23.4   11.0        Use the calculated Patient Ratio above and the CHD Risk Table to determine the patient's CHD Risk.        ATP III CLASSIFICATION (LDL):  <100      mg/dL   Optimal  100-129  mg/dL   Near or Above                    Optimal  130-159  mg/dL   Borderline  160-189  mg/dL   High  >190     mg/dL   Very High Performed at [facility] [address] [place] [address] [place], [place] [address]   TSH     Status: None   Collection Time: 01/28/20  3:42 AM  Result Value Ref Range   TSH 1.317 0.350 - 4.500 uIU/mL    Comment: Performed by a 3rd Generation  assay with a functional sensitivity of <=0.01 uIU/mL. Performed at [facility], [place] [address], [place], [place] [address]     Blood Alcohol level:  Lab Results  Component Value Date   ETH <10 [id number]    Metabolic Disorder Labs:  No results found for: HGBA1C, MPG No results found for: PROLACTIN Lab Results  Component Value Date   CHOL 204 (H) 01/28/2020   TRIG 68 01/28/2020   HDL 36 (L) 01/28/2020   CHOLHDL 5.7 01/28/2020   VLDL 14 01/28/2020   LDLCALC 154 (H) 01/28/2020    Current Medications: Current Facility-Administered Medications  Medication Dose Route Frequency Provider Last Rate Last Admin  . acetaminophen (TYLENOL) tablet 650 mg  650 mg Oral Q6H PRN [name], [name], NP      . alum & mag hydroxide-simeth (MAALOX/MYLANTA) 200-200-20 MG/5ML suspension 30 mL  30 mL Oral Q4H PRN [name], [name], NP      . cloNIDine (CATAPRES) tablet 0.1 mg  0.1 mg Oral QID [name], [name], NP   0.1 mg at 01/28/20 1150   Followed by  . [START ON 01/29/2020] cloNIDine (CATAPRES) tablet 0.1 mg  0.1 mg Oral BH-qamhs [name], [name], NP       Followed by  . [START ON 02/01/2020] cloNIDine (CATAPRES) tablet 0.1 mg  0.1 mg Oral QAC breakfast [name], [name], NP      . dicyclomine (BENTYL) tablet 20 mg  20 mg Oral Q6H PRN [name], [name], NP      . hydrOXYzine (ATARAX/VISTARIL) tablet 25 mg  25 mg Oral Q6H PRN [name], [name], NP      . loperamide (IMODIUM) capsule 2-4 mg  2-4 mg Oral PRN [name], [name], NP   4 mg at 01/28/20 1444  . magnesium hydroxide (MILK OF  MAGNESIA) suspension 30 mL  30 mL Oral Daily PRN [name], [name], NP      . methocarbamol (ROBAXIN) tablet 500 mg  500 mg Oral Q8H PRN [name], [name], NP      . naproxen (NAPROSYN) tablet 500 mg  500 mg Oral BID PRN [name], [name], NP      . ondansetron (ZOFRAN-ODT) disintegrating tablet 4 mg  4 mg Oral Q6H PRN [name], [name], NP   4 mg at 01/28/20 1444   PTA Medications: Medications Prior to Admission  Medication Sig Dispense Refill Last Dose  . cephALEXin (KEFLEX) 500 MG capsule Take 2 capsules (1,000 mg total) by mouth 2 (two) times daily. (Patient not taking: Reported on 01/13/2020) 28 capsule 0 Unknown at Unknown time  . clotrimazole-betamethasone (LOTRISONE) cream Apply to penile area 2 times daily prn (Patient not taking: Reported on 01/13/2020) 15 g 0 Unknown at Unknown time  . diphenhydrAMINE-zinc acetate (BENADRYL ITCH STOPPING) cream Apply topically 3 (three) times daily as needed for itching. (Patient not taking: Reported on 01/13/2020) 28.3 g 0 Unknown at Unknown time  . EPINEPHrine 0.3 mg/0.3 mL IJ SOAJ injection Inject 0.3 mLs (0.3 mg total) into the muscle as needed for up to 1 dose for anaphylaxis. (Patient not taking: Reported on 01/27/2020) 1 each 0 Unknown at Unknown time  . famotidine (PEPCID) 20 MG tablet Take 1 tablet (20 mg total) by mouth 2 (two) times daily for 5 days. (Patient not taking: Reported on 01/27/2020) 10 tablet 0   . furosemide (LASIX) 20 MG tablet Take 1 tablet (20 mg total) by mouth daily for 5 days. (Patient not taking: Reported on 01/27/2020) 5 tablet  0   . HYDROcodone-acetaminophen (NORCO/VICODIN) 5-325 MG tablet Take 1 tablet by mouth every 6 (six) hours as needed. (Patient not taking: Reported on 01/13/2020) 8 tablet 0 Unknown at Unknown time  . ibuprofen (ADVIL,MOTRIN) 600 MG tablet Take 1 tablet (600 mg total) by mouth every 8 (eight) hours as needed. (Patient not taking: Reported on 01/13/2020) 12 tablet 0 Unknown at Unknown time  . loratadine (CLARITIN) 10  MG tablet Take 1 tablet (10 mg total) by mouth daily for 5 days. (Patient not taking: Reported on 01/27/2020) 5 tablet 0   . methocarbamol (ROBAXIN) 500 MG tablet Take 1-2 tablets (500-1,000 mg total) by mouth 2 (two) times daily. (Patient not taking: Reported on 01/13/2020) 20 tablet 0 Unknown at Unknown time  . predniSONE (DELTASONE) 50 MG tablet Take 1 tablet (50 mg total) by mouth daily. (Patient not taking: Reported on 01/13/2020) 5 tablet 0 Unknown at Unknown time    Musculoskeletal: Strength & Muscle Tone: within normal limits Gait & Station: normal Patient leans: N/A  Psychiatric Specialty Exam: Physical Exam  Nursing note and vitals reviewed. Constitutional: He is oriented to person, place, and time. He appears well-developed and well-nourished.  HENT:  Head: Normocephalic and atraumatic.  Respiratory: Effort normal.  Neurological: He is alert and oriented to person, place, and time.    Review of Systems  Blood pressure 110/83, pulse 91, temperature 97.7 F (36.5 C), temperature source Oral, resp. rate 16, height 5\' 9"  (1.753 m), weight 77.1 kg, SpO2 100 %.Body mass index is 25.1 kg/m.  General Appearance: Disheveled  Eye Contact:  Minimal  Speech:  Normal Rate  Volume:  Decreased  Mood:  Anxious, Depressed and Dysphoric  Affect:  Constricted  Thought Process:  Coherent and Descriptions of Associations: Intact  Orientation:  Full (Time, Place, and Person)  Thought Content:  Logical  Suicidal Thoughts:  Yes.  without intent/plan  Homicidal Thoughts:  No  Memory:  Immediate;   Poor Recent;   Poor Remote;   Poor  Judgement:  Impaired  Insight:  Fair  Psychomotor Activity:  Increased  Concentration:  Concentration: Fair and Attention Span: Fair  Recall:  [employer] of Knowledge:  Fair  Language:  Good  Akathisia:  Negative  Handed:  Right  AIMS (if indicated):     Assets:  Desire for Improvement Resilience  ADL's:  Intact  Cognition:  WNL  Sleep:       Treatment  Plan Summary: Daily contact with patient to assess and evaluate symptoms and progress in treatment, Medication management and Plan : Patient is seen and examined.  Patient is a 59 year old male with the above-stated past psychiatric history who was admitted to the hospital secondary to opiate dependence, opiate withdrawal and suicidal ideation.  He will be admitted to the hospital.  He will be integrated in the milieu.  He will be encouraged to attend groups.  He denied any previous psychiatric admissions, he denied any previous admissions for substance rehabilitation.  He stated he is interested in substance rehabilitation.  He will be placed on the opiate detox protocol.  Review of his medical records revealed an MRI from 03/11/2016 that revealed multiple levels of degenerative disc disease between L3, L4, L5, S1, T2, L1.  He also has a history of renal carcinoma.  Review of his admission laboratories revealed a mildly elevated glucose, mildly elevated creatinine, and alkaline phosphatase of 129.  His liver function enzymes were normal.  Total cholesterol was mildly elevated 204.  His  CBC had a mildly elevated white blood cell count at 14.4.  Differential was not done.  TSH was 1.317.  Urinalysis was essentially normal.  Drug screen was negative.  Blood alcohol was less than 10.  Chest x-ray showed no active cardiopulmonary disease.  EKG showed a sinus rhythm with a normal QTc interval.  An ultrasound was obtained earlier this month looking for DVT, but was negative.  Currently his vital signs are stable, he is afebrile.  Observation Level/Precautions:  Detox 15 minute checks  Laboratory:  Chemistry Profile  Psychotherapy:    Medications:    Consultations:    Discharge Concerns:    Estimated LOS:  Other:     Physician Treatment Plan for Primary Diagnosis: <principal problem not specified> Long Term Goal(s): Improvement in symptoms so as ready for discharge  Short Term Goals: Ability to identify  changes in lifestyle to reduce recurrence of condition will improve, Ability to verbalize feelings will improve, Ability to disclose and discuss suicidal ideas, Ability to demonstrate self-control will improve, Ability to identify and develop effective coping behaviors will improve, Ability to maintain clinical measurements within normal limits will improve, Compliance with prescribed medications will improve and Ability to identify triggers associated with substance abuse/mental health issues will improve  Physician Treatment Plan for Secondary Diagnosis: Active Problems:   MDD (major depressive disorder), severe ([place])   Opiate dependence ([place])  Long Term Goal(s): Improvement in symptoms so as ready for discharge  Short Term Goals: Ability to identify changes in lifestyle to reduce recurrence of condition will improve, Ability to verbalize feelings will improve, Ability to disclose and discuss suicidal ideas, Ability to demonstrate self-control will improve, Ability to identify and develop effective coping behaviors will improve, Ability to maintain clinical measurements within normal limits will improve, Compliance with prescribed medications will improve and Ability to identify triggers associated with substance abuse/mental health issues will improve  I certify that inpatient services furnished can reasonably be expected to improve the patient's condition.    Sharma Covert, MD 5/19/20212:58 PM

## 2020-01-28 NOTE — BHH Suicide Risk Assessment (Signed)
[facility] Admission Suicide Risk Assessment   Nursing information obtained from:  Patient, Review of record Demographic factors:  Male, Unemployed Current Mental Status:  Suicidal ideation indicated by patient Loss Factors:  Financial problems / change in socioeconomic status Historical Factors:  Impulsivity Risk Reduction Factors:  Positive social support, Employed, Positive therapeutic relationship, Sense of responsibility to family, Living with another person, especially a relative, Positive coping skills or problem solving skills  Total Time spent with patient: 30 minutes Principal Problem: <principal problem not specified> Diagnosis:  Active Problems:   MDD (major depressive disorder), severe ([facility])   Opiate dependence ([place])  Subjective Data: Patient is seen and examined.  Patient is a 59 year old male with a past psychiatric history significant for opiate dependence and opiate withdrawal who presented to the [facility] emergency department on 01/27/2020 with suicidal ideation.  The patient stated that he has a longstanding history of opiate dependence.  He has been using opiates since around 2010.  He started after he had had back surgery.  Most recently he is going to use fentanyl approximately 6 months ago.  Unfortunately this continued and he did poorly.  Most recently he lost his job, and has attempted to quit using fentanyl but has not been successful.  His wife is having significant stress over this, and stated that he cannot return home until he gets his drug issues under control.  The patient admitted to helplessness, hopelessness and worthlessness.  He admitted to suicidal ideation as well as opiate withdrawal.  He was admitted to the hospital for evaluation and stabilization.  Continued Clinical Symptoms:  Alcohol Use Disorder Identification Test Final Score (AUDIT): 3 The "Alcohol Use Disorders Identification Test", Guidelines for Use in Primary Care, Second Edition.   [facility]). Score between 0-7:  no or low risk or alcohol related problems. Score between 8-15:  moderate risk of alcohol related problems. Score between 16-19:  high risk of alcohol related problems. Score 20 or above:  warrants further diagnostic evaluation for alcohol dependence and treatment.   CLINICAL FACTORS:   Depression:   Anhedonia Comorbid alcohol abuse/dependence Hopelessness Impulsivity Insomnia Alcohol/Substance Abuse/Dependencies   Musculoskeletal: Strength & Muscle Tone: within normal limits Gait & Station: normal Patient leans: N/A  Psychiatric Specialty Exam: Physical Exam  Nursing note and vitals reviewed. Constitutional: He is oriented to person, place, and time. He appears well-developed and well-nourished.  HENT:  Head: Normocephalic and atraumatic.  Respiratory: Effort normal.  Neurological: He is alert and oriented to person, place, and time.    Review of Systems  Blood pressure 110/83, pulse 91, temperature 97.7 F (36.5 C), temperature source Oral, resp. rate 16, height 5\' 9"  (1.753 m), weight 77.1 kg, SpO2 100 %.Body mass index is 25.1 kg/m.  General Appearance: Disheveled  Eye Contact:  Minimal  Speech:  Normal Rate  Volume:  Decreased  Mood:  Anxious, Depressed and Dysphoric  Affect:  Congruent  Thought Process:  Coherent and Descriptions of Associations: Circumstantial  Orientation:  Full (Time, Place, and Person)  Thought Content:  Logical  Suicidal Thoughts:  Yes.  without intent/plan  Homicidal Thoughts:  No  Memory:  Immediate;   Fair Recent;   Fair Remote;   Fair  Judgement:  Impaired  Insight:  Fair  Psychomotor Activity:  Increased  Concentration:  Concentration: Fair and Attention Span: Fair  Recall:  Poor  Fund of Knowledge:  Fair  Language:  Good  Akathisia:  Negative  Handed:  Right  AIMS (if indicated):     Assets:  Desire for Improvement Resilience  ADL's:  Intact  Cognition:  WNL  Sleep:          COGNITIVE FEATURES THAT CONTRIBUTE TO RISK:  None    SUICIDE RISK:   Mild:  Suicidal ideation of limited frequency, intensity, duration, and specificity.  There are no identifiable plans, no associated intent, mild dysphoria and related symptoms, good self-control (both objective and subjective assessment), few other risk factors, and identifiable protective factors, including available and accessible social support.  PLAN OF CARE: Patient is seen and examined.  Patient is a 59 year old male with the above-stated past psychiatric history who was admitted to the hospital secondary to opiate dependence, opiate withdrawal and suicidal ideation.  He will be admitted to the hospital.  He will be integrated in the milieu.  He will be encouraged to attend groups.  He denied any previous psychiatric admissions, he denied any previous admissions for substance rehabilitation.  He stated he is interested in substance rehabilitation.  He will be placed on the opiate detox protocol.  Review of his medical records revealed an MRI from 03/11/2016 that revealed multiple levels of degenerative disc disease between L3, L4, L5, S1, T2, L1.  He also has a history of renal carcinoma.  Review of his admission laboratories revealed a mildly elevated glucose, mildly elevated creatinine, and alkaline phosphatase of 129.  His liver function enzymes were normal.  Total cholesterol was mildly elevated 204.  His CBC had a mildly elevated white blood cell count at 14.4.  Differential was not done.  TSH was 1.317.  Urinalysis was essentially normal.  Drug screen was negative.  Blood alcohol was less than 10.  Chest x-ray showed no active cardiopulmonary disease.  EKG showed a sinus rhythm with a normal QTc interval.  An ultrasound was obtained earlier this month looking for DVT, but was negative.  Currently his vital signs are stable, he is afebrile.  I certify that inpatient services furnished can reasonably be expected to  improve the patient's condition.   Sharma Covert, MD 01/28/2020, 12:53 PM

## 2020-01-28 NOTE — Progress Notes (Signed)
  COVID-19 Daily Checkoff  Have you had a fever (temp > 37.80C/100F)  in the past 24 hours?  No  If you have had runny nose, nasal congestion, sneezing in the past 24 hours, has it worsened? No  COVID-19 EXPOSURE  Have you traveled outside the state in the past 14 days? No  Have you been in contact with someone with a confirmed diagnosis of COVID-19 or PUI in the past 14 days without wearing appropriate PPE? No  Have you been living in the same home as a person with confirmed diagnosis of COVID-19 or a PUI (household contact)? No  Have you been diagnosed with COVID-19? No               D:  Patient presents A&O x 4,  laying in bed awake. Respond appropriately to RN when entering the room. Patient c/o withdrawal sx to include nausea. COWS score #7. Patient given Clonidine and Zofran for sx relief. Patient stated, "I just want to get over this. I've lost so much since being on this Fentanyl. I still feel suicidal but I wouldn't hurt myself. I just want some help. I can't take living like this anymore".  Denies HI, A/VH when asked.  Remained calm and cooperative throughout interview from RN. A: Support and encouragement provided. Routine safety checks conducted every 15 minutes per unit protocol. Encouraged patient to notify staff with any needs/concerns. Patient verbalized agreement.   R: Patient remains safe at this time, patient verbally contracts for safety at this time. Will continue to monitor.

## 2020-01-28 NOTE — Plan of Care (Signed)
[place] Observation Crisis Plan  Reason for Crisis Plan:  Crisis Stabilization   Plan of Care:  Referral for Inpatient Hospitalization  Family Support:      Current Living Environment:     Insurance:   Hospital Account    Name Acct ID Class Status Primary Coverage   Alder, [place] [id number] [place] MH/DD/SAS - [address]        Guarantor Account (for Hospital Account [id number])    Name Relation to Pt Service Area Active? Acct Type   Mell, [place]   Address Phone       [address] [place], [place] [address] [phone])          Coverage Information (for Hospital Account [id number])    F/O Payor/Plan Precert #   Joes #   Rickye, Chai [id number]   Address Phone   [address] [place], [place] [address] [phone]      Legal Guardian:     Primary Care Provider:  Milana Na., MD  Current Outpatient Providers:  Puschinsky, Fransico Him, MD  Psychiatrist:     Counselor/Therapist:     Compliant with Medications:  Yes  Additional Information:   Jesusita Oka 5/19/20214:15 AM

## 2020-01-28 NOTE — BH Assessment (Signed)
[place] Assessment Progress Note  This voluntary pt reportedly requires psychiatric hospitalization at this time.  Jasmine has assigned pt to [facility] Rm 302-1.  Pt's nurse has been notified.  Jalene Mullet, [place] Coordinator [phone]

## 2020-01-28 NOTE — Progress Notes (Signed)
Pt stated she was having hard time, but wants to get off the drugs.    01/28/20 2200  Psych Admission Type (Psych Patients Only)  Admission Status Voluntary  Psychosocial Assessment  Patient Complaints Anxiety;Substance abuse  Eye Contact Fair  Facial Expression Flat  Affect Sad;Flat  Speech Logical/coherent  Interaction Forwards little  Motor Activity Restless  Appearance/Hygiene Unremarkable;In scrubs  Behavior Characteristics Cooperative  Mood Depressed;Pleasant  Thought Process  Coherency WDL  Content WDL  Delusions None reported or observed  Perception WDL  Hallucination None reported or observed  Judgment Impaired  Confusion None  Danger to Self  Current suicidal ideation? Passive  Self-Injurious Behavior No self-injurious ideation or behavior indicators observed or expressed   Agreement Not to Harm Self Yes  Description of Agreement verbal

## 2020-01-28 NOTE — Progress Notes (Signed)
Patient ID: Joshua Lewis, male   DOB: 1960-12-07, 59 y.o.   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  Pt states, I can't live anymore.  SI with plan to run into traffic.  Skin search completed, monitoring for safety, no distress noted, calm & cooperative.

## 2020-01-28 NOTE — Progress Notes (Signed)
RN gave report to Legrand Como, RN and patient escorted to 303-2 bed for continued level of care. Safety maintained.

## 2020-01-28 NOTE — [facility] Group Notes (Signed)
Pt did not attend wrap up group this evening. Pt was in bed resting. 

## 2020-01-28 NOTE — [facility] Group Notes (Signed)
01/28/2020 8:45am Type of Group and Topic: Psychoeducational Group: Discharge Planning  Participation Level: Did Not Attend  Description of Group Discharge planning group reviews patient's anticipated discharge plans and assists patients to anticipate and address any barriers to wellness/recovery in the community. Suicide prevention education is reviewed with patients in group. Therapeutic Goals 1. Patients will state their anticipated discharge plan and mental health aftercare 2. Patients will identify potential barriers to wellness in the community setting 3. Patients will engage in problem solving, solution focused discussion of ways to anticipate and address barriers to wellness/recovery   Summary of Patient Progress Plan for Discharge/Comments: Invited, chose not to attend.     Therapeutic Modalities: Motivational Interviewing     Radonna Ricker, MSW, [place] Worker [facility]  Phone: [phone] 01/28/2020 2:17 PM

## 2020-01-29 DIAGNOSIS — F322 Major depressive disorder, single episode, severe without psychotic features: Secondary | ICD-10-CM

## 2020-01-29 MED ORDER — NICOTINE 21 MG/24HR TD PT24
21.0000 mg | MEDICATED_PATCH | Freq: Every day | TRANSDERMAL | Status: DC
  Administered 2020-01-29 – 2020-02-02 (×5): 21 mg via TRANSDERMAL
  Filled 2020-01-29 (×7): qty 1

## 2020-01-29 MED ORDER — MIRTAZAPINE 15 MG PO TABS
15.0000 mg | ORAL_TABLET | Freq: Every day | ORAL | Status: DC
  Administered 2020-01-29 – 2020-01-31 (×3): 15 mg via ORAL
  Filled 2020-01-29: qty 7
  Filled 2020-01-29 (×5): qty 1

## 2020-01-29 MED ORDER — SERTRALINE HCL 50 MG PO TABS
50.0000 mg | ORAL_TABLET | Freq: Every day | ORAL | Status: DC
  Administered 2020-01-29 – 2020-02-02 (×5): 50 mg via ORAL
  Filled 2020-01-29 (×4): qty 1
  Filled 2020-01-29: qty 7
  Filled 2020-01-29 (×3): qty 1

## 2020-01-29 NOTE — Progress Notes (Signed)
[facility] MD Progress Note  01/29/2020 10:52 AM Joshua Lewis  MRN:  [id number]   Subjective: Follow-up for this 59 year old male diagnosed with MDD severe and opiate use disorder.  Patient reports today that he is just feeling bad and tired and states that he feels it is due to his withdrawals.  He states that he is just been using drugs for way too long and needs to stay away from it and his plan is to go through withdrawals and start feeling better and then hopefully go to a residential treatment after discharge.  He states that he wants to stay away from any controlled substances at all.  He reports his only complaint with medication was that the trazodone made him feel too sleepy in the morning and did not want to take it.  He reported that he had used his son trazodone and Klonopin in the past but did not want to take those anymore.  He reports that he felt as though he slept fairly well but woke up a couple times.  He states that before 2010 he dealt with disrupted sleep, feeling depressed, anxious, as well as having suicidal ideations.  He states that he would like to have some medication to assist with his depression so that he does not start feeling that way again once the drugs have worn off.  He states he has not been on any previous medications in the past that were prescribed to him for mental health issues.  Today he denies having any suicidal or homicidal ideations and denies any hallucinations.  Principal Problem: MDD (major depressive disorder), severe ([place]) Diagnosis: Principal Problem:   MDD (major depressive disorder), severe ([facility]) Active Problems:   Opiate dependence ([place])  Total Time spent with patient: 30 minutes  Past Psychiatric History: Patient denied any previous psychiatric admissions, psychiatric medications or psychiatric evaluations.  He stated he had tried to kill himself several months ago, but did not seek assistance for that afterwards.   Past Medical History:  Past  Medical History:  Diagnosis Date  . Chronic back pain greater than 3 months duration   . Kidney carcinoma [facility])     Past Surgical History:  Procedure Laterality Date  . HAND SURGERY    . HERNIA REPAIR    . NEPHRECTOMY     right   Family History:  Family History  Problem Relation Age of Onset  . Cancer Mother   . Diabetes Mother   . Hypertension Mother    Family Psychiatric  History: None reported Social History:  Social History   Substance and Sexual Activity  Alcohol Use No     Social History   Substance and Sexual Activity  Drug Use No    Social History   Socioeconomic History  . Marital status: Married    Spouse name: Not on file  . Number of children: Not on file  . Years of education: Not on file  . Highest education level: Not on file  Occupational History  . Not on file  Tobacco Use  . Smoking status: Current Some Day Smoker    Packs/day: 0.50  . Smokeless tobacco: Never Used  Substance and Sexual Activity  . Alcohol use: No  . Drug use: No  . Sexual activity: Not on file  Other Topics Concern  . Not on file  Social History Narrative  . Not on file   Social Determinants of Health   Financial Resource Strain:   . Difficulty of Paying Living Expenses:  Food Insecurity:   . Worried About Charity fundraiser in the Last Year:   . Arboriculturist in the Last Year:   Transportation Needs:   . Film/video editor (Medical):   Marland Kitchen Lack of Transportation (Non-Medical):   Physical Activity:   . Days of Exercise per Week:   . Minutes of Exercise per Session:   Stress:   . Feeling of Stress :   Social Connections:   . Frequency of Communication with Friends and Family:   . Frequency of Social Gatherings with Friends and Family:   . Attends Religious Services:   . Active Member of Clubs or Organizations:   . Attends Archivist Meetings:   Marland Kitchen Marital Status:    Additional Social History:                         Sleep:  Good  Appetite:  Fair  Current Medications: Current Facility-Administered Medications  Medication Dose Route Frequency Provider Last Rate Last Admin  . acetaminophen (TYLENOL) tablet 650 mg  650 mg Oral Q6H PRN [name], [name], NP      . alum & mag hydroxide-simeth (MAALOX/MYLANTA) 200-200-20 MG/5ML suspension 30 mL  30 mL Oral Q4H PRN [name], [name], NP      . cloNIDine (CATAPRES) tablet 0.1 mg  0.1 mg Oral QID [name], [name], NP   0.1 mg at 01/29/20 0815   Followed by  . cloNIDine (CATAPRES) tablet 0.1 mg  0.1 mg Oral BH-qamhs [name], [name], NP       Followed by  . [START ON 02/01/2020] cloNIDine (CATAPRES) tablet 0.1 mg  0.1 mg Oral QAC breakfast [name], [name], NP      . dicyclomine (BENTYL) tablet 20 mg  20 mg Oral Q6H PRN [name], [name], NP   20 mg at 01/29/20 0818  . hydrOXYzine (ATARAX/VISTARIL) tablet 25 mg  25 mg Oral Q6H PRN [name], [name], NP   25 mg at 01/29/20 0819  . loperamide (IMODIUM) capsule 2-4 mg  2-4 mg Oral PRN [name], [name], NP   2 mg at 01/29/20 0820  . magnesium hydroxide (MILK OF MAGNESIA) suspension 30 mL  30 mL Oral Daily PRN [name], [name], NP      . methocarbamol (ROBAXIN) tablet 500 mg  500 mg Oral Q8H PRN [name], [name], NP   500 mg at 01/29/20 0820  . mirtazapine (REMERON) tablet 15 mg  15 mg Oral QHS Jeny Nield, [name] [name], FNP      . naproxen (NAPROSYN) tablet 500 mg  500 mg Oral BID PRN [name], [name], NP   500 mg at 01/29/20 0819  . ondansetron (ZOFRAN-ODT) disintegrating tablet 4 mg  4 mg Oral Q6H PRN [name], [name], NP   4 mg at 01/29/20 0819  . sertraline (ZOLOFT) tablet 50 mg  50 mg Oral Daily Ciela Mahajan, [name] [name], FNP        Lab Results:  Results for orders placed or performed during the hospital encounter of 01/28/20 (from the past 48 hour(s))  Lipid panel     Status: Abnormal   Collection Time: 01/28/20  3:42 AM  Result Value Ref Range   Cholesterol 204 (H) 0 - 200 mg/dL   Triglycerides 68 <150 mg/dL   HDL 36 (L) >40 mg/dL    Total CHOL/HDL Ratio 5.7 RATIO   VLDL 14 0 - 40 mg/dL   LDL Cholesterol 154 (H) 0 -  99 mg/dL    Comment:        Total Cholesterol/HDL:CHD Risk Coronary Heart Disease Risk Table                     Men   Women  1/2 Average Risk   3.4   3.3  Average Risk       5.0   4.4  2 X Average Risk   9.6   7.1  3 X Average Risk  23.4   11.0        Use the calculated Patient Ratio above and the CHD Risk Table to determine the patient's CHD Risk.        ATP III CLASSIFICATION (LDL):  <100     mg/dL   Optimal  100-129  mg/dL   Near or Above                    Optimal  130-159  mg/dL   Borderline  160-189  mg/dL   High  >190     mg/dL   Very High Performed at [facility] [address], [place], [place] [address]   TSH     Status: None   Collection Time: 01/28/20  3:42 AM  Result Value Ref Range   TSH 1.317 0.350 - 4.500 uIU/mL    Comment: Performed by a 3rd Generation assay with a functional sensitivity of <=0.01 uIU/mL. Performed at [facility], [place] [address], [place],  [address]     Blood Alcohol level:  Lab Results  Component Value Date   ETH <10 123456    Metabolic Disorder Labs: No results found for: HGBA1C, MPG No results found for: PROLACTIN Lab Results  Component Value Date   CHOL 204 (H) 01/28/2020   TRIG 68 01/28/2020   HDL 36 (L) 01/28/2020   CHOLHDL 5.7 01/28/2020   VLDL 14 01/28/2020   LDLCALC 154 (H) 01/28/2020    Physical Findings: AIMS: Facial and Oral Movements Muscles of Facial Expression: None, normal Lips and Perioral Area: None, normal Jaw: None, normal Tongue: None, normal,Extremity Movements Upper (arms, wrists, hands, fingers): None, normal Lower (legs, knees, ankles, toes): None, normal, Trunk Movements Neck, shoulders, hips: None, normal, Overall Severity Severity of abnormal movements (highest score from questions above): None, normal Incapacitation due to abnormal movements: None,  normal Patient's awareness of abnormal movements (rate only patient's report): No Awareness, Dental Status Current problems with teeth and/or dentures?: No Does patient usually wear dentures?: No  CIWA:  CIWA-Ar Total: 2 COWS:  COWS Total Score: 5  Musculoskeletal: Strength & Muscle Tone: within normal limits Gait & Station: normal Patient leans: N/A  Psychiatric Specialty Exam: Physical Exam  Nursing note and vitals reviewed. Constitutional: He is oriented to person, place, and time. He appears well-developed and well-nourished.  Cardiovascular: Normal rate.  Respiratory: Effort normal.  Musculoskeletal:        General: Normal range of motion.  Neurological: He is alert and oriented to person, place, and time.  Skin: Skin is warm.  Psychiatric: His mood appears anxious. He exhibits a depressed mood.    Review of Systems  Constitutional: Positive for fatigue.  HENT: Negative.   Eyes: Negative.   Respiratory: Negative.   Cardiovascular: Negative.   Gastrointestinal: Negative.   Genitourinary: Negative.   Musculoskeletal: Negative.   Skin: Negative.   Neurological: Negative.   Psychiatric/Behavioral: Positive for dysphoric mood. The patient is nervous/anxious.     Blood pressure  112/84, pulse 78, temperature 98 F (36.7 C), temperature source Oral, resp. rate 16, height 5\' 9"  (1.753 m), weight 77.1 kg, SpO2 100 %.Body mass index is 25.1 kg/m.  General Appearance: Disheveled  Eye Contact:  Fair  Speech:  Clear and Coherent and Normal Rate  Volume:  Decreased  Mood:  Anxious and Depressed  Affect:  Depressed and Flat  Thought Process:  Coherent and Descriptions of Associations: Intact  Orientation:  Full (Time, Place, and Person)  Thought Content:  WDL  Suicidal Thoughts:  No  Homicidal Thoughts:  No  Memory:  Immediate;   Fair Recent;   Fair Remote;   Fair  Judgement:  Fair  Insight:  Fair  Psychomotor Activity:  Normal  Concentration:  Concentration: Fair   Recall:  [employer] of Knowledge:  Fair  Language:  Fair  Akathisia:  No  Handed:  Right  AIMS (if indicated):     Assets:  Desire for Improvement Housing Social Support Transportation  ADL's:  Intact  Cognition:  WNL  Sleep:  Number of Hours: 8.5   Assessment: Patient presents in the day room and has been seen interacting with peers and staff appropriately.  Patient has been spending a lot of his day in the day room watching TV as well.  Patient is pleasant, calm, cooperative, but appears to be depressed and reports anxiety.  Reviewed his chart and discussed medications.  We will continue clonidine detox and start patient on Zoloft 50 mg p.o. daily and Remeron 15 mg p.o. nightly.  Will discuss with social work about potential referral to residential treatment  Treatment Plan Summary: Daily contact with patient to assess and evaluate symptoms and progress in treatment and Medication management Continue clonidine detox protocol Start Remeron 15 mg p.o. nightly for sleep and mood stability Start Zoloft 50 mg p.o. daily for depression and anxiety Continue Vistaril 25 mg p.o. every 6 hours as needed for anxiety Encourage group therapy participation Continue every 15 minute safety checks Discussed with social work for residential referral  Lewis Shock, [place] 01/29/2020, 10:52 AM

## 2020-01-29 NOTE — Progress Notes (Signed)
Pt was given white t-shirt and blue sweat pants (with Nurse beverly approval). String was cut out of pants per patient approval.

## 2020-01-29 NOTE — Progress Notes (Signed)
   01/29/20 2200  Psych Admission Type (Psych Patients Only)  Admission Status Voluntary  Psychosocial Assessment  Patient Complaints Anxiety  Eye Contact Fair  Facial Expression Flat  Affect Sad;Flat  Speech Logical/coherent  Interaction Forwards little  Motor Activity Restless  Appearance/Hygiene Unremarkable;In scrubs  Behavior Characteristics Cooperative  Mood Depressed  Thought Process  Coherency WDL  Content WDL  Delusions None reported or observed  Perception WDL  Hallucination None reported or observed  Judgment Impaired  Confusion None  Danger to Self  Current suicidal ideation? Passive  Self-Injurious Behavior No self-injurious ideation or behavior indicators observed or expressed   Agreement Not to Harm Self Yes  Description of Agreement verbal

## 2020-01-29 NOTE — Progress Notes (Signed)
Patient's self inventory sheet, patient has fair sleep, sleep medication helpful.    Poor appetite, low energy level, poor concentration.  Rated depression, anxiety and hopeless #9.  Withdrawals, tremors, diarrhea, chilling, cramping, nausea.  Denied SI.  Dizziness at times, headaches, blurred vision.  Physical pain, back, worst pain #7.  Pain medicine helpful.  No discharge plans.

## 2020-01-30 NOTE — Progress Notes (Signed)
[facility] MD Progress Note  01/30/2020 10:42 AM Joshua Lewis  MRN:  [id number]   Subjective: Follow-up for this 59 year old male diagnosed with MDD severe and opiate use disorder.  Patient states that he feels that things are getting a little bit better.  He still reports feeling depressed and anxious and states that those are "pretty high."  He reports that his withdrawal symptoms are improving and he feels that the medication is helping with that.  He states he has been sleeping well still and that his appetite is been fair and he continues to try to eat.  He states he still want to go to residential treatment and is waiting to hear from social worker about his referrals.  Patient then states that he would like to be able to be discharged by Monday and that he has spoken to his wife about returning home and she just wanted to make sure that he was getting the treatment that he needed to stay away from any substances.  Principal Problem: MDD (major depressive disorder), severe ([place]) Diagnosis: Principal Problem:   MDD (major depressive disorder), severe ([facility]) Active Problems:   Opiate dependence ([place])  Total Time spent with patient: 20 minutes  Past Psychiatric History: Patient denied any previous psychiatric admissions, psychiatric medications or psychiatric evaluations.  He stated he had tried to kill himself several months ago, but did not seek assistance for that afterwards.   Past Medical History:  Past Medical History:  Diagnosis Date  . Chronic back pain greater than 3 months duration   . Kidney carcinoma [facility])     Past Surgical History:  Procedure Laterality Date  . HAND SURGERY    . HERNIA REPAIR    . NEPHRECTOMY     right   Family History:  Family History  Problem Relation Age of Onset  . Cancer Mother   . Diabetes Mother   . Hypertension Mother    Family Psychiatric  History: None reported Social History:  Social History   Substance and Sexual Activity  Alcohol Use No      Social History   Substance and Sexual Activity  Drug Use No    Social History   Socioeconomic History  . Marital status: Married    Spouse name: Not on file  . Number of children: Not on file  . Years of education: Not on file  . Highest education level: Not on file  Occupational History  . Not on file  Tobacco Use  . Smoking status: Current Some Day Smoker    Packs/day: 0.50  . Smokeless tobacco: Never Used  Substance and Sexual Activity  . Alcohol use: No  . Drug use: No  . Sexual activity: Not on file  Other Topics Concern  . Not on file  Social History Narrative  . Not on file   Social Determinants of Health   Financial Resource Strain:   . Difficulty of Paying Living Expenses:   Food Insecurity:   . Worried About Charity fundraiser in the Last Year:   . Arboriculturist in the Last Year:   Transportation Needs:   . Film/video editor (Medical):   Marland Kitchen Lack of Transportation (Non-Medical):   Physical Activity:   . Days of Exercise per Week:   . Minutes of Exercise per Session:   Stress:   . Feeling of Stress :   Social Connections:   . Frequency of Communication with Friends and Family:   . Frequency of Social  Gatherings with Friends and Family:   . Attends Religious Services:   . Active Member of Clubs or Organizations:   . Attends Archivist Meetings:   Marland Kitchen Marital Status:    Additional Social History:                         Sleep: Good  Appetite:  Fair  Current Medications: Current Facility-Administered Medications  Medication Dose Route Frequency Provider Last Rate Last Admin  . acetaminophen (TYLENOL) tablet 650 mg  650 mg Oral Q6H PRN [name], [name], NP      . alum & mag hydroxide-simeth (MAALOX/MYLANTA) 200-200-20 MG/5ML suspension 30 mL  30 mL Oral Q4H PRN [name], [name], NP      . cloNIDine (CATAPRES) tablet 0.1 mg  0.1 mg Oral BH-qamhs [name], [name], NP   0.1 mg at 01/30/20 0819   Followed by  . [START ON  02/01/2020] cloNIDine (CATAPRES) tablet 0.1 mg  0.1 mg Oral QAC breakfast [name], [name], NP      . dicyclomine (BENTYL) tablet 20 mg  20 mg Oral Q6H PRN [name], [name], NP   20 mg at 01/29/20 1428  . hydrOXYzine (ATARAX/VISTARIL) tablet 25 mg  25 mg Oral Q6H PRN [name], [name], NP   25 mg at 01/29/20 2115  . loperamide (IMODIUM) capsule 2-4 mg  2-4 mg Oral PRN [name], [name], NP   2 mg at 01/29/20 1907  . magnesium hydroxide (MILK OF MAGNESIA) suspension 30 mL  30 mL Oral Daily PRN [name], [name], NP      . methocarbamol (ROBAXIN) tablet 500 mg  500 mg Oral Q8H PRN [name], [name], NP   500 mg at 01/29/20 0820  . mirtazapine (REMERON) tablet 15 mg  15 mg Oral QHS Annalynn Centanni B, FNP   15 mg at 01/29/20 2115  . naproxen (NAPROSYN) tablet 500 mg  500 mg Oral BID PRN [name], [name], NP   500 mg at 01/29/20 0819  . nicotine (NICODERM CQ - dosed in mg/24 hours) patch 21 mg  21 mg Transdermal Daily Khair Chasteen, [name] B, FNP   21 mg at 01/30/20 0819  . ondansetron (ZOFRAN-ODT) disintegrating tablet 4 mg  4 mg Oral Q6H PRN [name], [name], NP   4 mg at 01/29/20 1428  . sertraline (ZOLOFT) tablet 50 mg  50 mg Oral Daily Semira Stoltzfus, [name] [name], FNP   50 mg at 01/30/20 [id number]    Lab Results:  No results found for this or any previous visit (from the past 48 hour(s)).  Blood Alcohol level:  Lab Results  Component Value Date   ETH <10 123456    Metabolic Disorder Labs: No results found for: HGBA1C, MPG No results found for: PROLACTIN Lab Results  Component Value Date   CHOL 204 (H) 01/28/2020   TRIG 68 01/28/2020   HDL 36 (L) 01/28/2020   CHOLHDL 5.7 01/28/2020   VLDL 14 01/28/2020   LDLCALC 154 (H) 01/28/2020    Physical Findings: AIMS: Facial and Oral Movements Muscles of Facial Expression: None, normal Lips and Perioral Area: None, normal Jaw: None, normal Tongue: None, normal,Extremity Movements Upper (arms, wrists, hands, fingers): None, normal Lower (legs, knees, ankles,  toes): None, normal, Trunk Movements Neck, shoulders, hips: None, normal, Overall Severity Severity of abnormal movements (highest score from questions above): None, normal Incapacitation due to abnormal movements: None, normal Patient's awareness of abnormal movements (rate only patient's report): No  Awareness, Dental Status Current problems with teeth and/or dentures?: No Does patient usually wear dentures?: No  CIWA:  CIWA-Ar Total: 2 COWS:  COWS Total Score: 2  Musculoskeletal: Strength & Muscle Tone: within normal limits Gait & Station: normal Patient leans: N/A  Psychiatric Specialty Exam: Physical Exam  Nursing note and vitals reviewed. Constitutional: He is oriented to person, place, and time. He appears well-developed and well-nourished.  Cardiovascular: Normal rate.  Respiratory: Effort normal.  Musculoskeletal:        General: Normal range of motion.  Neurological: He is alert and oriented to person, place, and time.  Skin: Skin is warm.  Psychiatric: His mood appears anxious. He exhibits a depressed mood.    Review of Systems  Constitutional: Positive for fatigue.  HENT: Negative.   Eyes: Negative.   Respiratory: Negative.   Cardiovascular: Negative.   Gastrointestinal: Negative.   Genitourinary: Negative.   Musculoskeletal: Negative.   Skin: Negative.   Neurological: Negative.   Psychiatric/Behavioral: Positive for dysphoric mood. The patient is nervous/anxious.     Blood pressure 111/81, pulse 74, temperature 98.6 F (37 C), temperature source Oral, resp. rate 16, height 5\' 9"  (1.753 m), weight 77.1 kg, SpO2 99 %.Body mass index is 25.1 kg/m.  General Appearance: Disheveled  Eye Contact:  Fair  Speech:  Clear and Coherent and Normal Rate  Volume:  Decreased  Mood:  Anxious and Depressed  Affect:  Depressed and Flat  Thought Process:  Coherent and Descriptions of Associations: Intact  Orientation:  Full (Time, Place, and Person)  Thought Content:  WDL   Suicidal Thoughts:  No  Homicidal Thoughts:  No  Memory:  Immediate;   Fair Recent;   Fair Remote;   Fair  Judgement:  Fair  Insight:  Fair  Psychomotor Activity:  Normal  Concentration:  Concentration: Fair  Recall:  [employer] of Knowledge:  Fair  Language:  Fair  Akathisia:  No  Handed:  Right  AIMS (if indicated):     Assets:  Desire for Improvement Housing Social Support Transportation  ADL's:  Intact  Cognition:  WNL  Sleep:  Number of Hours: 6   Assessment: Patient presents in the day room interacting with peers and staff appropriately.  Patient has been watching TV has not shown any signs of agitation, aggression, or psychotic features.  Patient continues to report depression and anxiety with all related to his substance use and the things that he feels he is messed up in his life due to it.  He has been compliant with his medications and treatment and has been attending groups.  Social work is continuing to assist patient with referrals to residential treatment.  Feel that patient could possibly be stabilized by Monday for discharge and can possibly follow-up on his own with residential treatment if he returns home.  No new labs.  Vital signs remained WNL.  Treatment Plan Summary: Daily contact with patient to assess and evaluate symptoms and progress in treatment and Medication management Continue clonidine detox protocol Continue Remeron 15 mg p.o. nightly for sleep and mood stability Continue Zoloft 50 mg p.o. daily for depression and anxiety Continue Vistaril 25 mg p.o. every 6 hours as needed for anxiety Encourage group therapy participation Continue every 15 minute safety checks Discussed with social work for residential referral  Lewis Shock, Door 01/30/2020, 10:42 AM
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
## 2020-01-30 NOTE — Plan of Care (Signed)
Patient stayed in bed. Was out of bed for snack. Alert and oriented but guarded, refusing to participate in group activities. Refused bedtime medications, reporting that he does not like it makes him feel. Had no additional concerns. Currently in bed resting. Safety monitored as recommended.

## 2020-01-30 NOTE — Tx Team (Signed)
Interdisciplinary Treatment and Diagnostic Plan Update  01/30/2020 Time of Session: 9:00am Joshua Lewis MRN: [id number]  Principal Diagnosis: MDD (major depressive disorder), severe ([name])  Secondary Diagnoses: Principal Problem:   MDD (major depressive disorder), severe ([name]) Active Problems:   Opiate dependence ([name])   Current Medications:  Current Facility-Administered Medications  Medication Dose Route Frequency Provider Last Rate Last Admin  . acetaminophen (TYLENOL) tablet 650 mg  650 mg Oral Q6H PRN [name], [name], NP      . alum & mag hydroxide-simeth (MAALOX/MYLANTA) 200-200-20 MG/5ML suspension 30 mL  30 mL Oral Q4H PRN [name], [name], NP      . cloNIDine (CATAPRES) tablet 0.1 mg  0.1 mg Oral BH-qamhs [name], [name], NP   0.1 mg at 01/30/20 0819   Followed by  . [START ON 02/01/2020] cloNIDine (CATAPRES) tablet 0.1 mg  0.1 mg Oral QAC breakfast [name], [name], NP      . dicyclomine (BENTYL) tablet 20 mg  20 mg Oral Q6H PRN [name], [name], NP   20 mg at 01/29/20 1428  . hydrOXYzine (ATARAX/VISTARIL) tablet 25 mg  25 mg Oral Q6H PRN [name], [name], NP   25 mg at 01/29/20 2115  . loperamide (IMODIUM) capsule 2-4 mg  2-4 mg Oral PRN [name], [name], NP   2 mg at 01/29/20 1907  . magnesium hydroxide (MILK OF MAGNESIA) suspension 30 mL  30 mL Oral Daily PRN [name], [name], NP      . methocarbamol (ROBAXIN) tablet 500 mg  500 mg Oral Q8H PRN [name], [name], NP   500 mg at 01/29/20 0820  . mirtazapine (REMERON) tablet 15 mg  15 mg Oral QHS Money, Travis B, FNP   15 mg at 01/29/20 2115  . naproxen (NAPROSYN) tablet 500 mg  500 mg Oral BID PRN [name], [name], NP   500 mg at 01/29/20 0819  . nicotine (NICODERM CQ - dosed in mg/24 hours) patch 21 mg  21 mg Transdermal Daily Money, [name] B, FNP   21 mg at 01/30/20 0819  . ondansetron (ZOFRAN-ODT) disintegrating tablet 4 mg  4 mg Oral Q6H PRN [name], [name], NP   4 mg at 01/29/20 1428  . sertraline (ZOLOFT) tablet 50 mg   50 mg Oral Daily Money, [name] [name], FNP   50 mg at 01/30/20 0923   PTA Medications: Medications Prior to Admission  Medication Sig Dispense Refill Last Dose  . cephALEXin (KEFLEX) 500 MG capsule Take 2 capsules (1,000 mg total) by mouth 2 (two) times daily. (Patient not taking: Reported on 01/13/2020) 28 capsule 0 Unknown at Unknown time  . clotrimazole-betamethasone (LOTRISONE) cream Apply to penile area 2 times daily prn (Patient not taking: Reported on 01/13/2020) 15 g 0 Unknown at Unknown time  . diphenhydrAMINE-zinc acetate (BENADRYL ITCH STOPPING) cream Apply topically 3 (three) times daily as needed for itching. (Patient not taking: Reported on 01/13/2020) 28.3 g 0 Unknown at Unknown time  . EPINEPHrine 0.3 mg/0.3 mL IJ SOAJ injection Inject 0.3 mLs (0.3 mg total) into the muscle as needed for up to 1 dose for anaphylaxis. (Patient not taking: Reported on 01/27/2020) 1 each 0 Unknown at Unknown time  . famotidine (PEPCID) 20 MG tablet Take 1 tablet (20 mg total) by mouth 2 (two) times daily for 5 days. (Patient not taking: Reported on 01/27/2020) 10 tablet 0   . furosemide (LASIX) 20 MG tablet Take 1 tablet (20 mg total) by mouth daily for 5 days. (  Patient not taking: Reported on 01/27/2020) 5 tablet 0   . HYDROcodone-acetaminophen (NORCO/VICODIN) 5-325 MG tablet Take 1 tablet by mouth every 6 (six) hours as needed. (Patient not taking: Reported on 01/13/2020) 8 tablet 0 Unknown at Unknown time  . ibuprofen (ADVIL,MOTRIN) 600 MG tablet Take 1 tablet (600 mg total) by mouth every 8 (eight) hours as needed. (Patient not taking: Reported on 01/13/2020) 12 tablet 0 Unknown at Unknown time  . loratadine (CLARITIN) 10 MG tablet Take 1 tablet (10 mg total) by mouth daily for 5 days. (Patient not taking: Reported on 01/27/2020) 5 tablet 0   . methocarbamol (ROBAXIN) 500 MG tablet Take 1-2 tablets (500-1,000 mg total) by mouth 2 (two) times daily. (Patient not taking: Reported on 01/13/2020) 20 tablet 0 Unknown at Unknown  time  . predniSONE (DELTASONE) 50 MG tablet Take 1 tablet (50 mg total) by mouth daily. (Patient not taking: Reported on 01/13/2020) 5 tablet 0 Unknown at Unknown time    Patient Stressors:    Patient Strengths:    Treatment Modalities: Medication Management, Group therapy, Case management,  1 to 1 session with clinician, Psychoeducation, Recreational therapy.   Physician Treatment Plan for Primary Diagnosis: MDD (major depressive disorder), severe ([place]) Long Term Goal(s): Improvement in symptoms so as ready for discharge Improvement in symptoms so as ready for discharge   Short Term Goals: Ability to identify changes in lifestyle to reduce recurrence of condition will improve Ability to verbalize feelings will improve Ability to disclose and discuss suicidal ideas Ability to demonstrate self-control will improve Ability to identify and develop effective coping behaviors will improve Ability to maintain clinical measurements within normal limits will improve Compliance with prescribed medications will improve Ability to identify triggers associated with substance abuse/mental health issues will improve Ability to identify changes in lifestyle to reduce recurrence of condition will improve Ability to verbalize feelings will improve Ability to disclose and discuss suicidal ideas Ability to demonstrate self-control will improve Ability to identify and develop effective coping behaviors will improve Ability to maintain clinical measurements within normal limits will improve Compliance with prescribed medications will improve Ability to identify triggers associated with substance abuse/mental health issues will improve  Medication Management: Evaluate patient's response, side effects, and tolerance of medication regimen.  Therapeutic Interventions: 1 to 1 sessions, Unit Group sessions and Medication administration.  Evaluation of Outcomes: Not Met  Physician Treatment Plan for  Secondary Diagnosis: Principal Problem:   MDD (major depressive disorder), severe ([place]) Active Problems:   Opiate dependence ([place])  Long Term Goal(s): Improvement in symptoms so as ready for discharge Improvement in symptoms so as ready for discharge   Short Term Goals: Ability to identify changes in lifestyle to reduce recurrence of condition will improve Ability to verbalize feelings will improve Ability to disclose and discuss suicidal ideas Ability to demonstrate self-control will improve Ability to identify and develop effective coping behaviors will improve Ability to maintain clinical measurements within normal limits will improve Compliance with prescribed medications will improve Ability to identify triggers associated with substance abuse/mental health issues will improve Ability to identify changes in lifestyle to reduce recurrence of condition will improve Ability to verbalize feelings will improve Ability to disclose and discuss suicidal ideas Ability to demonstrate self-control will improve Ability to identify and develop effective coping behaviors will improve Ability to maintain clinical measurements within normal limits will improve Compliance with prescribed medications will improve Ability to identify triggers associated with substance abuse/mental health issues will improve  Medication Management: Evaluate patient's response, side effects, and tolerance of medication regimen.  Therapeutic Interventions: 1 to 1 sessions, Unit Group sessions and Medication administration.  Evaluation of Outcomes: Not Met   RN Treatment Plan for Primary Diagnosis: MDD (major depressive disorder), severe ([place]) Long Term Goal(s): Knowledge of disease and therapeutic regimen to maintain health will improve  Short Term Goals: Ability to verbalize feelings will improve and Ability to identify and develop effective coping behaviors will improve  Medication Management: RN will  administer medications as ordered by provider, will assess and evaluate patient's response and provide education to patient for prescribed medication. RN will report any adverse and/or side effects to prescribing provider.  Therapeutic Interventions: 1 on 1 counseling sessions, Psychoeducation, Medication administration, Evaluate responses to treatment, Monitor vital signs and CBGs as ordered, Perform/monitor CIWA, COWS, AIMS and Fall Risk screenings as ordered, Perform wound care treatments as ordered.  Evaluation of Outcomes: Not Met   LCSW Treatment Plan for Primary Diagnosis: MDD (major depressive disorder), severe ([place]) Long Term Goal(s): Safe transition to appropriate next level of care at discharge, Engage patient in therapeutic group addressing interpersonal concerns.  Short Term Goals: Engage patient in aftercare planning with referrals and resources, Increase social support, Identify triggers associated with mental health/substance abuse issues and Increase skills for wellness and recovery  Therapeutic Interventions: Assess for all discharge needs, 1 to 1 time with Social worker, Explore available resources and support systems, Assess for adequacy in community support network, Educate family and significant other(s) on suicide prevention, Complete Psychosocial Assessment, Interpersonal group therapy.  Evaluation of Outcomes: Not Met  Progress in Treatment: Attending groups: No Participating in groups: No. Taking medication as prescribed: Yes. Toleration medication: Yes. Family/Significant other contact made: No, will contact:  spouse. Patient understands diagnosis: Yes. Discussing patient identified problems/goals with staff: Yes. Medical problems stabilized or resolved: Yes. Denies suicidal/homicidal ideation: No. Issues/concerns per patient self-inventory: Yes.  New problem(s) identified: Yes, Describe:  financial stressors.  New Short Term/Long Term Goal(s): detox,  medication management for mood stabilization; elimination of SI thoughts; development of comprehensive mental wellness/sobriety plan.  Patient Goals: "Get clean and stay clean." Wants resources for financial assistance.  Discharge Plan or Barriers: Referrals faxed for residential substance use treatment.  Reason for Continuation of Hospitalization: Anxiety Depression Medication stabilization Withdrawal symptoms  Estimated Length of Stay: 3-5 days  Attendees: Patient: Joshua Lewis 01/30/2020 9:33 AM  Physician:  01/30/2020 9:33 AM  Nursing:  01/30/2020 9:33 AM  RN Care Manager: 01/30/2020 9:33 AM  Social Worker: Stephanie Acre, Latanya Presser 01/30/2020 9:33 AM  Recreational Therapist:  01/30/2020 9:33 AM  Other: Marvia Pickles, NP 01/30/2020 9:33 AM  Other:  01/30/2020 9:33 AM  Other: 01/30/2020 9:33 AM    Scribe for Treatment Team: Joellen Jersey, Ignacio 01/30/2020 9:33 AM

## 2020-01-30 NOTE — Progress Notes (Signed)
Recreation Therapy Notes  Date:  5.21.21 Time: 0930 Location: [address] Group Room  Group Topic: Stress Management  Goal Area(s) Addresses:  Patient will identify positive stress management techniques. Patient will identify benefits of using stress management post d/c.  Behavioral Response:  Engaged  Intervention: Stress Management  Activity:  Meditation.  LRT played a meditation that focused on being resilient in the face of adversity.  Patients were to listen and follow along as meditation played to participate.    Education:  Stress Management, Discharge Planning.   Education Outcome: Acknowledges Education  Clinical Observations/Feedback: Pt attended and participated in activity.     Victorino Sparrow, LRT/CTRS         Ria Comment, Silvio Sausedo A 01/30/2020 11:30 AM

## 2020-01-31 MED ORDER — MELATONIN 5 MG PO TABS
5.0000 mg | ORAL_TABLET | Freq: Every day | ORAL | Status: DC
  Administered 2020-01-31: 5 mg via ORAL
  Filled 2020-01-31 (×3): qty 1
  Filled 2020-01-31: qty 7
  Filled 2020-01-31: qty 1

## 2020-01-31 NOTE — Progress Notes (Signed)
   01/31/20 0610  Vital Signs  Temp 97.7 F (36.5 C)  Temp Source Oral  Pulse Rate 60  Pulse Rate Source Monitor  BP 125/81  BP Method Automatic  Oxygen Therapy  SpO2 100 %  D: Patient presented with an anxious affect. Patient denies SI/HI/AVH. Patient reported poor sleep because the medicine "didn't put me to sleep." Patient took all medicine. Patient isolated in his room for most of this shift, but did come out for snack and for a short time outside. A: Support and encouragement provided Routine safety checks conducted every 15 minutes. Patient  Informed to notify staff with any concerns.   R:  Safety maintained.

## 2020-01-31 NOTE — [facility] Group Notes (Signed)
LCSW Group Therapy Note  01/31/2020   10:00-11:00am   Type of Therapy and Topic:  Group Therapy: Anger Cues and Responses  Participation Level:  Did Not Attend   Description of Group:   In this group, patients learned how to recognize the physical, cognitive, emotional, and behavioral responses they have to anger-provoking situations.  They identified a recent time they became angry and how they reacted.  They analyzed how their reaction was possibly beneficial and how it was possibly unhelpful.  The group discussed a variety of healthier coping skills that could help with such a situation in the future.  Focus was placed on how helpful it is to recognize the underlying emotions to our anger, because working on those can lead to a more permanent solution as well as our ability to focus on the important rather than the urgent.  Therapeutic Goals: 1. Patients will remember their last incident of anger and how they felt emotionally and physically, what their thoughts were at the time, and how they behaved. 2. Patients will identify how their behavior at that time worked for them, as well as how it worked against them. 3. Patients will explore possible new behaviors to use in future anger situations. 4. Patients will learn that anger itself is normal and cannot be eliminated, and that healthier reactions can assist with resolving conflict rather than worsening situations.  Summary of Patient Progress:  The patient was invited to group but did not attend.  Therapeutic Modalities:   Cognitive Behavioral Therapy  Maretta Los

## 2020-01-31 NOTE — Progress Notes (Signed)
[facility] MD Progress Note  01/31/2020 3:16 PM Joshua Lewis  MRN:  [id number]   Subjective: Joshua Lewis reports, "I'm feeling a lot better. Can I be discharged today".  Objective: Patient is a 59 year old male with a past psychiatric history significant for opiate dependence and opiate withdrawal who presented to the [facility] emergency department on 01/27/2020 with suicidal ideation. The patient stated that he has a longstanding history of opiate dependence. He has been using opiates since around 2010. He started after he had had back surgery.   Follow-up care assessment for this 59 year old male diagnosed with MDD severe and opiate use disorder.  Patient states that he feels that things are getting better.  He still reports improved mood.  He reports that his withdrawal symptoms are improving and he feels that the medication is helping with those.  He states he has been sleeping well still and that his appetite is good.  He states he still want to go to residential treatment and is waiting to hear from social worker about his referrals, however, feels he can get discharged today if no residential treatment program is available for him.    Principal Problem: MDD (major depressive disorder), severe ([place])  Diagnosis: Principal Problem:   MDD (major depressive disorder), severe ([facility]) Active Problems:   Opiate dependence ([place])  Total Time spent with patient: 15 minutes  Past Psychiatric History: Patient denied any previous psychiatric admissions, psychiatric medications or psychiatric evaluations.  He stated he had tried to kill himself several months ago, but did not seek assistance for that afterwards.   Past Medical History:  Past Medical History:  Diagnosis Date  . Chronic back pain greater than 3 months duration   . Kidney carcinoma [facility])     Past Surgical History:  Procedure Laterality Date  . HAND SURGERY    . HERNIA REPAIR    . NEPHRECTOMY     right   Family History:   Family History  Problem Relation Age of Onset  . Cancer Mother   . Diabetes Mother   . Hypertension Mother    Family Psychiatric  History: None reported Social History:  Social History   Substance and Sexual Activity  Alcohol Use No     Social History   Substance and Sexual Activity  Drug Use No    Social History   Socioeconomic History  . Marital status: Married    Spouse name: Not on file  . Number of children: Not on file  . Years of education: Not on file  . Highest education level: Not on file  Occupational History  . Not on file  Tobacco Use  . Smoking status: Current Some Day Smoker    Packs/day: 0.50  . Smokeless tobacco: Never Used  Substance and Sexual Activity  . Alcohol use: No  . Drug use: No  . Sexual activity: Not on file  Other Topics Concern  . Not on file  Social History Narrative  . Not on file   Social Determinants of Health   Financial Resource Strain:   . Difficulty of Paying Living Expenses:   Food Insecurity:   . Worried About Charity fundraiser in the Last Year:   . Arboriculturist in the Last Year:   Transportation Needs:   . Film/video editor (Medical):   Marland Kitchen Lack of Transportation (Non-Medical):   Physical Activity:   . Days of Exercise per Week:   . Minutes of Exercise per Session:  Stress:   . Feeling of Stress :   Social Connections:   . Frequency of Communication with Friends and Family:   . Frequency of Social Gatherings with Friends and Family:   . Attends Religious Services:   . Active Member of Clubs or Organizations:   . Attends Archivist Meetings:   Marland Kitchen Marital Status:    Additional Social History:   Sleep: Good  Appetite:  Good  Current Medications: Current Facility-Administered Medications  Medication Dose Route Frequency Provider Last Rate Last Admin  . acetaminophen (TYLENOL) tablet 650 mg  650 mg Oral Q6H PRN [name], [name], NP      . alum & mag hydroxide-simeth (MAALOX/MYLANTA)  200-200-20 MG/5ML suspension 30 mL  30 mL Oral Q4H PRN [name], [name], NP      . [START ON 02/01/2020] cloNIDine (CATAPRES) tablet 0.1 mg  0.1 mg Oral QAC breakfast [name], [name], NP      . dicyclomine (BENTYL) tablet 20 mg  20 mg Oral Q6H PRN [name], [name], NP   20 mg at 01/29/20 1428  . hydrOXYzine (ATARAX/VISTARIL) tablet 25 mg  25 mg Oral Q6H PRN [name], [name], NP   25 mg at 01/29/20 2115  . loperamide (IMODIUM) capsule 2-4 mg  2-4 mg Oral PRN [name], [name], NP   2 mg at 01/29/20 1907  . magnesium hydroxide (MILK OF MAGNESIA) suspension 30 mL  30 mL Oral Daily PRN [name], [name], NP      . methocarbamol (ROBAXIN) tablet 500 mg  500 mg Oral Q8H PRN [name], [name], NP   500 mg at 01/29/20 0820  . mirtazapine (REMERON) tablet 15 mg  15 mg Oral QHS Money, Travis B, FNP   15 mg at 01/31/20 0007  . naproxen (NAPROSYN) tablet 500 mg  500 mg Oral BID PRN [name], [name], NP   500 mg at 01/29/20 0819  . nicotine (NICODERM CQ - dosed in mg/24 hours) patch 21 mg  21 mg Transdermal Daily Money, [name] [name], FNP   21 mg at 01/31/20 0806  . ondansetron (ZOFRAN-ODT) disintegrating tablet 4 mg  4 mg Oral Q6H PRN [name], [name], NP   4 mg at 01/30/20 1541  . sertraline (ZOLOFT) tablet 50 mg  50 mg Oral Daily Money, [name] [name], FNP   50 mg at 01/31/20 [id number]   Lab Results:  No results found for this or any previous visit (from the past 48 hour(s)).  Blood Alcohol level:  Lab Results  Component Value Date   ETH <10 123456   Metabolic Disorder Labs: No results found for: HGBA1C, MPG No results found for: PROLACTIN Lab Results  Component Value Date   CHOL 204 (H) 01/28/2020   TRIG 68 01/28/2020   HDL 36 (L) 01/28/2020   CHOLHDL 5.7 01/28/2020   VLDL 14 01/28/2020   LDLCALC 154 (H) 01/28/2020   Physical Findings: AIMS: Facial and Oral Movements Muscles of Facial Expression: None, normal Lips and Perioral Area: None, normal Jaw: None, normal Tongue: None, normal,Extremity  Movements Upper (arms, wrists, hands, fingers): None, normal Lower (legs, knees, ankles, toes): None, normal, Trunk Movements Neck, shoulders, hips: None, normal, Overall Severity Severity of abnormal movements (highest score from questions above): None, normal Incapacitation due to abnormal movements: None, normal Patient's awareness of abnormal movements (rate only patient's report): No Awareness, Dental Status Current problems with teeth and/or dentures?: No Does patient usually wear dentures?: No  CIWA:  CIWA-Ar Total: 2 COWS:  COWS Total Score: 1  Musculoskeletal: Strength & Muscle Tone: within normal limits Gait & Station: normal Patient leans: N/A  Psychiatric Specialty Exam: Physical Exam  Nursing note and vitals reviewed. Constitutional: He is oriented to person, place, and time. He appears well-developed and well-nourished.  Cardiovascular: Normal rate.  Respiratory: Effort normal.  Genitourinary:    Genitourinary Comments: Deferred   Musculoskeletal:        General: Normal range of motion.     Cervical back: Normal range of motion.  Neurological: He is alert and oriented to person, place, and time.  Skin: Skin is warm.  Psychiatric: His mood appears anxious. He exhibits a depressed mood.    Review of Systems  Constitutional: Negative for activity change and fatigue.  HENT: Negative.   Eyes: Negative.   Respiratory: Negative.   Cardiovascular: Negative.   Gastrointestinal: Negative.   Genitourinary: Negative.   Musculoskeletal: Negative.   Skin: Negative.   Neurological: Negative.   Psychiatric/Behavioral: Positive for dysphoric mood ("Improving"). The patient is nervous/anxious. Hyperactive: "Improving"     Blood pressure 118/87, pulse 63, temperature 97.7 F (36.5 C), temperature source Oral, resp. rate 16, height 5\' 9"  (1.753 m), weight 77.1 kg, SpO2 100 %.Body mass index is 25.1 kg/m.  General Appearance: Fairly Groomed  Eye Contact:  Fair  Speech:   Clear and Coherent and Normal Rate  Volume:  Normal  Mood:  "Improving"  Affect:  Appropriate and Congruent  Thought Process:  Coherent and Descriptions of Associations: Intact  Orientation:  Full (Time, Place, and Person)  Thought Content:  WDL  Suicidal Thoughts:  No  Homicidal Thoughts:  No  Memory:  Immediate;   Fair Recent;   Fair Remote;   Fair  Judgement:  Fair  Insight:  Fair  Psychomotor Activity:  Normal  Concentration:  Concentration: Fair  Recall:  [employer] of Knowledge:  Fair  Language:  Fair  Akathisia:  No  Handed:  Right  AIMS (if indicated):     Assets:  Desire for Improvement Housing Social Support Transportation  ADL's:  Intact  Cognition:  WNL  Sleep:  Number of Hours: 6   Assessment: Patient presents in the day room interacting with peers and staff appropriately.  Patient has been watching TV has not shown any signs of agitation, aggression, or psychotic features.  Patient reports improved mood and anxiety.  He has been compliant with his medications and treatment and has been attending groups.  Social work is continuing to assist patient with referrals to residential treatment.  Feel that patient could possibly be stabilized by Monday for discharge and can possibly follow-up on his own with residential treatment if he returns home.  No new labs.  Vital signs remained WNL.  Treatment Plan Summary: Daily contact with patient to assess and evaluate symptoms and progress in treatment and Medication management.  - Continue inpatient hospitalization. - Will continue today 01/31/2020 plan as below except where it is noted.  Continue clonidine detox protocol for opioid withdrawal Continue Remeron 15 mg p.o. nightly for insomnia/depression. Continue Zoloft 50 mg p.o. daily for depression/anxiety Continue Vistaril 25 mg p.o. every 6 hours as needed for anxiety  Encourage group therapy participation Continue every 15 minute safety checks Discussed with social  work for residential referral.  Lindell Spar, NP 01/31/2020, 3:16 PMPatient ID: Jacinto Reap, male   DOB: July 20, 1961, 59 y.o.   MRN: [id number]

## 2020-01-31 NOTE — BHH Suicide Risk Assessment (Signed)
[facility] INPATIENT:  Family/Significant Other Suicide Prevention Education  Suicide Prevention Education:  Education Completed; wife, [name] [phone]),  (name of family member/significant other) has been identified by the patient as the family member/significant other with whom the patient will be residing, and identified as the person(s) who will aid the patient in the event of a mental health crisis (suicidal ideations/suicide attempt).  With written consent from the patient, the family member/significant other has been provided the following suicide prevention education, prior to the and/or following the discharge of the patient.  The suicide prevention education provided includes the following:  Suicide risk factors  Suicide prevention and interventions  National Suicide Hotline telephone number  [facility] assessment telephone number  [facility] Emergency Assistance [place] and/or Residential Mobile Crisis Unit telephone number  Request made of family/significant other to:  Remove weapons (e.g., guns, rifles, knives), all items previously/currently identified as safety concern.    Remove drugs/medications (over-the-counter, prescriptions, illicit drugs), all items previously/currently identified as a safety concern.  The family member/significant other verbalizes understanding of the suicide prevention education information provided.  The family member/significant other agrees to remove the items of safety concern listed above.  Joshua Lewis 01/31/2020, 3:59 PM

## 2020-01-31 NOTE — Progress Notes (Signed)
   01/31/20 2234  Psych Admission Type (Psych Patients Only)  Admission Status Voluntary  Psychosocial Assessment  Patient Complaints Anxiety;Substance abuse  Eye Contact Fair  Facial Expression Flat  Affect Appropriate to circumstance  Speech Logical/coherent  Interaction Assertive  Motor Activity Other (Comment) (WDL)  Behavior Characteristics Appropriate to situation  Mood Depressed;Pleasant  Thought Process  Coherency WDL  Content WDL  Delusions None reported or observed  Perception WDL  Hallucination None reported or observed  Judgment Impaired  Confusion None  Danger to Self  Current suicidal ideation? Denies  Self-Injurious Behavior No self-injurious ideation or behavior indicators observed or expressed   Agreement Not to Harm Self Yes  Description of Agreement verbal  Danger to Others  Danger to Others None reported or observed

## 2020-01-31 NOTE — Progress Notes (Signed)
   01/31/20 2232  COVID-19 Daily Checkoff  Have you had a fever (temp > 37.80C/100F)  in the past 24 hours?  No  If you have had runny nose, nasal congestion, sneezing in the past 24 hours, has it worsened? No  COVID-19 EXPOSURE  Have you traveled outside the state in the past 14 days? No  Have you been in contact with someone with a confirmed diagnosis of COVID-19 or PUI in the past 14 days without wearing appropriate PPE? No  Have you been living in the same home as a person with confirmed diagnosis of COVID-19 or a PUI (household contact)? No  Have you been diagnosed with COVID-19? No

## 2020-01-31 NOTE — Progress Notes (Signed)
Patient slept  Throughout the night and  Woke up for morning routine. Pleasant and cooperative. No sign of distress. Currently eating breakfast. Safety precautions maintained.

## 2020-01-31 NOTE — Progress Notes (Signed)
Patient presented to the medication window and reported that he changed his mind, that he wanted to take his medications. Pleasant and cooperative. Received his medications then returned to bed.

## 2020-01-31 NOTE — Progress Notes (Signed)
The patient learned today that he can't change past behaviors and that he can change the way he faces tomorrow. His goal is to get discharged Monday or Tuesday.

## 2020-02-01 NOTE — Plan of Care (Signed)
Progress note  D: pt found in bed; compliant with medication administration. Pt denies any physical complaints or pain. Pt states they feel like they are progressing well and ready for discharge. Pt states their wife has voiced agreement with the pt coming home. Pt continues to want inpatient treatment but feels they will be compliant with their treatment plan given they discharge to home. Pt is pleasant Pt denies si/hi/ah/vh and verbally agrees to approach staff if these become apparent or before harming themself/others while at [facility]: Pt provided support and encouragement. Pt given medication per protocol and standing orders. Q54m safety checks implemented and continued.  R: Pt safe on the unit. Will continue to monitor.  Pt progressing in the following metrics  Problem: Education: Goal: Knowledge of [place] General Education information/materials will improve Outcome: Progressing   Problem: Safety: Goal: Periods of time without injury will increase Outcome: Progressing   Problem: Education: Goal: Knowledge of disease or condition will improve Outcome: Progressing Goal: Understanding of discharge needs will improve Outcome: Progressing

## 2020-02-01 NOTE — Progress Notes (Signed)
[facility] Group Notes:  (Nursing/MHT/Case Management/Adjunct)  Date:  02/01/2020  Time: 2030  Type of Therapy:  wrap up group  Participation Level:  Active  Participation Quality:  Appropriate, Attentive, Sharing and Supportive  Affect:  Appropriate  Cognitive:  Appropriate  Insight:  Improving  Engagement in Group:  Engaged  Modes of Intervention:  Clarification, Education and Support  Summary of Progress/Problems: Positive thinking and positive change were discussed.   Shellia Cleverly 02/01/2020, 9:50 PM

## 2020-02-01 NOTE — B[facility] Group Notes (Signed)
[facility] LCSW Group Therapy Note  02/01/2020    Type of Therapy and Topic:  Group Therapy:  A Hero Worthy of Support  Participation Level:  Active   Description of Group:  Patients in this group were introduced to the concept that additional supports including self-support are an essential part of recovery.  Matching needs with supports to help fulfill those needs was explained.  Establishing boundaries that can gradually be increased or decreased was described, with patients giving their own examples of establishing appropriate boundaries in their lives.  A song entitled "My Own Hero" was played and a group discussion ensued in which patients stated it inspired them to help themselves in order to succeed, because other people cannot achieve their goals such as sobriety or stability for them.  A song was played called "I Am Enough" which led to a discussion about being willing to believe we are worth the effort of being a self-support.   Therapeutic Goals: 1)  demonstrate the importance of being a key part of one's own support system 2)  discuss various available supports 3)  encourage patient to use music as part of their self-support and focus on goals 4)  elicit ideas from patients about supports that need to be added   Summary of Patient Progress:  The patient expressed that his son and wife are healthy supports, while he himself is an unhealthy support at times to himself.  He participated fully in the discussion.  Therapeutic Modalities:   Motivational Interviewing Activity  Maretta Los

## 2020-02-01 NOTE — Progress Notes (Signed)
[facility] MD Progress Note  02/01/2020 3:39 PM Joshua Lewis  MRN:  [id number]    Subjective: patient is describing partial improvement compared to admission. Currently denies suicidal ideations. Does not endorse medication side effects.  Objective: I have reviewed chart notes and have met with patient. 59 y old male, history of Opiate Use Disorder, presented on 5/18 for depression, suicidal ideations, opiate dependence. Contributing stressors included recently loss of his job.  Today patient reports partial improvement compared to admission and states he is feeling better. Opiate WDL symptoms have subsided , and currently does not present in any acute distress or discomfort. Denies suicidal ideations, and presents future oriented . Had reportedly  expressed interest in going to a rehab setting at discharge but also reports his wife has agreed that he can return home. He is currently on Remeron/Zoloft, Melatonin and is completing Clonidine taper . Tolerating medications well thus far . No agitated or disruptive behaviors, going to some groups   Principal Problem: MDD (major depressive disorder), severe ([place])  Diagnosis: Principal Problem:   MDD (major depressive disorder), severe ([place]) Active Problems:   Opiate dependence ([facility])  Total Time spent with patient: 20 minutes  Past Psychiatric History: Patient denied any previous psychiatric admissions, psychiatric medications or psychiatric evaluations.  He stated he had tried to kill himself several months ago, but did not seek assistance for that afterwards.   Past Medical History:  Past Medical History:  Diagnosis Date  . Chronic back pain greater than 3 months duration   . Kidney carcinoma [facility])     Past Surgical History:  Procedure Laterality Date  . HAND SURGERY    . HERNIA REPAIR    . NEPHRECTOMY     right   Family History:  Family History  Problem Relation Age of Onset  . Cancer Mother   . Diabetes Mother   . Hypertension  Mother    Family Psychiatric  History: None reported Social History:  Social History   Substance and Sexual Activity  Alcohol Use No     Social History   Substance and Sexual Activity  Drug Use No    Social History   Socioeconomic History  . Marital status: Married    Spouse name: Not on file  . Number of children: Not on file  . Years of education: Not on file  . Highest education level: Not on file  Occupational History  . Not on file  Tobacco Use  . Smoking status: Current Some Day Smoker    Packs/day: 0.50  . Smokeless tobacco: Never Used  Substance and Sexual Activity  . Alcohol use: No  . Drug use: No  . Sexual activity: Not on file  Other Topics Concern  . Not on file  Social History Narrative  . Not on file   Social Determinants of Health   Financial Resource Strain:   . Difficulty of Paying Living Expenses:   Food Insecurity:   . Worried About Charity fundraiser in the Last Year:   . Arboriculturist in the Last Year:   Transportation Needs:   . Film/video editor (Medical):   Marland Kitchen Lack of Transportation (Non-Medical):   Physical Activity:   . Days of Exercise per Week:   . Minutes of Exercise per Session:   Stress:   . Feeling of Stress :   Social Connections:   . Frequency of Communication with Friends and Family:   . Frequency of Social Gatherings with Friends and  Family:   . Attends Religious Services:   . Active Member of Clubs or Organizations:   . Attends Archivist Meetings:   Marland Kitchen Marital Status:    Additional Social History:   Sleep: Good  Appetite:  improving  Current Medications: Current Facility-Administered Medications  Medication Dose Route Frequency Provider Last Rate Last Admin  . acetaminophen (TYLENOL) tablet 650 mg  650 mg Oral Q6H PRN [name], [name], NP      . alum & mag hydroxide-simeth (MAALOX/MYLANTA) 200-200-20 MG/5ML suspension 30 mL  30 mL Oral Q4H PRN [name], [name], NP      . cloNIDine (CATAPRES)  tablet 0.1 mg  0.1 mg Oral QAC breakfast [name], [name], NP   0.1 mg at 02/01/20 0747  . dicyclomine (BENTYL) tablet 20 mg  20 mg Oral Q6H PRN [name], [name], NP   20 mg at 01/31/20 2110  . hydrOXYzine (ATARAX/VISTARIL) tablet 25 mg  25 mg Oral Q6H PRN [name], [name], NP   25 mg at 01/31/20 2110  . loperamide (IMODIUM) capsule 2-4 mg  2-4 mg Oral PRN [name], [name], NP   2 mg at 01/29/20 1907  . magnesium hydroxide (MILK OF MAGNESIA) suspension 30 mL  30 mL Oral Daily PRN [name], [name], NP      . melatonin tablet 5 mg  5 mg Oral QHS Nwoko, Agnes I, NP   5 mg at 01/31/20 2230  . methocarbamol (ROBAXIN) tablet 500 mg  500 mg Oral Q8H PRN [name], [name], NP   500 mg at 01/31/20 2110  . mirtazapine (REMERON) tablet 15 mg  15 mg Oral QHS Money, Travis B, FNP   15 mg at 01/31/20 2110  . naproxen (NAPROSYN) tablet 500 mg  500 mg Oral BID PRN [name], [name], NP   500 mg at 01/29/20 0819  . nicotine (NICODERM CQ - dosed in mg/24 hours) patch 21 mg  21 mg Transdermal Daily Money, [name] B, FNP   21 mg at 02/01/20 0747  . ondansetron (ZOFRAN-ODT) disintegrating tablet 4 mg  4 mg Oral Q6H PRN [name], [name], NP   4 mg at 01/31/20 2034  . sertraline (ZOLOFT) tablet 50 mg  50 mg Oral Daily Money, [name] [name], FNP   50 mg at 02/01/20 6568   Lab Results:  No results found for this or any previous visit (from the past 48 hour(s)).  Blood Alcohol level:  Lab Results  Component Value Date   ETH <10 12/75/1700   Metabolic Disorder Labs: No results found for: HGBA1C, MPG No results found for: PROLACTIN Lab Results  Component Value Date   CHOL 204 (H) 01/28/2020   TRIG 68 01/28/2020   HDL 36 (L) 01/28/2020   CHOLHDL 5.7 01/28/2020   VLDL 14 01/28/2020   LDLCALC 154 (H) 01/28/2020   Physical Findings: AIMS: Facial and Oral Movements Muscles of Facial Expression: None, normal Lips and Perioral Area: None, normal Jaw: None, normal Tongue: None, normal,Extremity Movements Upper (arms,  wrists, hands, fingers): None, normal Lower (legs, knees, ankles, toes): None, normal, Trunk Movements Neck, shoulders, hips: None, normal, Overall Severity Severity of abnormal movements (highest score from questions above): None, normal Incapacitation due to abnormal movements: None, normal Patient's awareness of abnormal movements (rate only patient's report): No Awareness, Dental Status Current problems with teeth and/or dentures?: No Does patient usually wear dentures?: No  CIWA:  CIWA-Ar Total: 3 COWS:  COWS Total Score: 6  Musculoskeletal: Strength & Muscle Tone:  within normal limits Gait & Station: normal Patient leans: N/A  Psychiatric Specialty Exam: Physical Exam  Nursing note and vitals reviewed. Constitutional: He is oriented to person, place, and time. He appears well-developed and well-nourished.  Cardiovascular: Normal rate.  Respiratory: Effort normal.  Genitourinary:    Genitourinary Comments: Deferred   Musculoskeletal:        General: Normal range of motion.     Cervical back: Normal range of motion.  Neurological: He is alert and oriented to person, place, and time.  Skin: Skin is warm.  Psychiatric: His mood appears anxious. He exhibits a depressed mood.    Review of Systems  Constitutional: Negative for activity change and fatigue.  HENT: Negative.   Eyes: Negative.   Respiratory: Negative.   Cardiovascular: Negative.   Gastrointestinal: Negative.   Genitourinary: Negative.   Musculoskeletal: Negative.   Skin: Negative.   Neurological: Negative.   Psychiatric/Behavioral: Positive for dysphoric mood ("Improving"). The patient is nervous/anxious. Hyperactive: "Improving"   No chest pain, no shortness of breath, no vomiting   Blood pressure (!) 137/97, pulse 75, temperature 97.7 F (36.5 C), temperature source Oral, resp. rate 16, height '5\' 9"'  (1.753 m), weight 77.1 kg, SpO2 100 %.Body mass index is 25.1 kg/m.  General Appearance: Well Groomed  Eye  Contact:  Good  Speech:  Normal Rate  Volume:  Normal  Mood:  reports mood is " getting better" , does present vaguely depressed, and affect is slightly constricted, although improves partially during session  Affect:  as above   Thought Process:  Linear and Descriptions of Associations: Intact  Orientation:  Other:  fully alert and attentive  Thought Content:  no hallucinations, no delusions   Suicidal Thoughts:  No denies SI or self injurious ideations  Homicidal Thoughts:  No  Memory:  recent and remote grossly intact   Judgement:  Other:  improving  Insight:  Fair and improving   Psychomotor Activity:  Normal- no restlessness or psychomotor agitation  Concentration:  Concentration: improving  and Attention Span: improving   Recall:  Good  Fund of Knowledge:  Good  Language:  Good  Akathisia:  No  Handed:  Right  AIMS (if indicated):     Assets:  Desire for Improvement Housing Social Support Transportation  ADL's:  Intact  Cognition:  WNL  Sleep:  Number of Hours: 6.25   Assessment:  12 y old male, history of Opiate Use Disorder, presented on 5/18 for depression, suicidal ideations, opiate dependence. Contributing stressors included recently loss of his job.  Patient reports he is feeling better and describes improving mood compared to admission.  He is not currently endorsing significant opiate withdrawal symptoms and appears calm and in no acute distress or discomfort.  Currently denies SI and presents future oriented.  Today he appears vaguely depressed and constricted although his affect tends to improve during session.  He had expressed interest in going to a rehab but today also states that he may discharged home as he reports his wife has agreed for him to do so.  He denies medication side effects  Treatment Plan Summary: Daily contact with patient to assess and evaluate symptoms and progress in treatment and Medication management. Treatment plan reviewed as below today  5/23 Encourage efforts to work on sobriety and relapse prevention Treatment team working on disposition planning options-see above-plan encourage patient to consider going to a rehab Continue clonidine detox protocol for opioid withdrawal Continue Remeron 15 mg p.o. nightly for insomnia/depression. Continue Zoloft  50 mg p.o. daily for depression/anxiety Continue Vistaril 25 mg p.o. every 6 hours as needed for anxiety   Jenne Campus, MD 02/01/2020, 3:39 PM   Patient ID: Joshua Lewis, male   DOB: 11/16/1960, 59 y.o.   MRN: [id number]

## 2020-02-02 MED ORDER — HYDROXYZINE HCL 25 MG PO TABS: 25.0000 mg | ORAL_TABLET | Freq: Four times a day (QID) | ORAL | 0 refills | Status: DC | PRN

## 2020-02-02 MED ORDER — MIRTAZAPINE 15 MG PO TABS: 15.0000 mg | ORAL_TABLET | Freq: Every day | ORAL | 0 refills | Status: DC

## 2020-02-02 MED ORDER — SERTRALINE HCL 50 MG PO TABS: 50.0000 mg | ORAL_TABLET | Freq: Every day | ORAL | 0 refills | Status: DC

## 2020-02-02 MED ORDER — NAPROXEN 500 MG PO TABS: 500.0000 mg | ORAL_TABLET | Freq: Two times a day (BID) | ORAL | 0 refills | Status: DC | PRN

## 2020-02-02 MED ORDER — MELATONIN 5 MG PO TABS: 5.0000 mg | ORAL_TABLET | Freq: Every day | ORAL | 0 refills | Status: DC

## 2020-02-02 NOTE — Progress Notes (Signed)
Recreation Therapy Notes  Date:  5.24.21 Time: 0930 Location: [address] Dayroom  Group Topic: Stress Management  Goal Area(s) Addresses:  Patient will identify positive stress management techniques. Patient will identify benefits of using stress management post d/c.  Intervention: Stress Management  Activity: [employer].  LRT played a meditation that focused on taking the characteristics of a mountain into your meditation and life.  Patients were to listen and follow along as meditation played.    Education:  Stress Management, Discharge Planning.   Education Outcome: Acknowledges Education  Clinical Observations/Feedback: Pt did not attend group activity.    Victorino Sparrow, LRT/CTRS         Victorino Sparrow A 02/02/2020 11:20 AM

## 2020-02-02 NOTE — Progress Notes (Signed)
   02/02/20 0059  Psych Admission Type (Psych Patients Only)  Admission Status Voluntary  Psychosocial Assessment  Patient Complaints None  Eye Contact Brief  Facial Expression Flat  Affect Appropriate to circumstance  Speech Logical/coherent  Interaction Minimal  Motor Activity Other (Comment) (WDL)  Appearance/Hygiene In scrubs  Behavior Characteristics Appropriate to situation  Mood Pleasant  Thought Process  Content WDL  Delusions WDL  Perception Depersonalization  Hallucination None reported or observed  Judgment WDL  Confusion None  Danger to Self  Current suicidal ideation? Denies  Self-Injurious Behavior No self-injurious ideation or behavior indicators observed or expressed   Agreement Not to Harm Self Yes  Description of Agreement verbal  Danger to Others  Danger to Others None reported or observed

## 2020-02-02 NOTE — Progress Notes (Addendum)
D:  Patient denied SI and HI, contracts for safety.  Denied A/V hallucinations.   A:  Medications administered per MD orders.  Emotional support and encouragement given patient. R:  Safety maintained with 15 minute checks.  

## 2020-02-02 NOTE — Progress Notes (Signed)
   02/02/20 0055  COVID-19 Daily Checkoff  Have you had a fever (temp > 37.80C/100F)  in the past 24 hours?  No  If you have had runny nose, nasal congestion, sneezing in the past 24 hours, has it worsened? No  COVID-19 EXPOSURE  Have you traveled outside the state in the past 14 days? No  Have you been in contact with someone with a confirmed diagnosis of COVID-19 or PUI in the past 14 days without wearing appropriate PPE? No  Have you been living in the same home as a person with confirmed diagnosis of COVID-19 or a PUI (household contact)? No  Have you been diagnosed with COVID-19? No

## 2020-02-02 NOTE — [facility] Suicide Risk Assessment (Signed)
[facility] Discharge Suicide Risk Assessment   Principal Problem: MDD (major depressive disorder), severe ([place]) Discharge Diagnoses: Principal Problem:   MDD (major depressive disorder), severe ([place]) Active Problems:   Opiate dependence ([place])   Total Time spent with patient: 15 minutes  Musculoskeletal: Strength & Muscle Tone: within normal limits Gait & Station: normal Patient leans: N/A  Psychiatric Specialty Exam: Review of Systems  All other systems reviewed and are negative.   Blood pressure (!) 126/95, pulse 74, temperature 98 F (36.7 C), temperature source Oral, resp. rate 18, height 5\' 9"  (1.753 m), weight 77.1 kg, SpO2 99 %.Body mass index is 25.1 kg/m.  General Appearance: Casual  Eye Contact::  Good  Speech:  Normal Rate409  Volume:  Normal  Mood:  Euthymic  Affect:  Congruent  Thought Process:  Coherent and Descriptions of Associations: Intact  Orientation:  Full (Time, Place, and Person)  Thought Content:  Logical  Suicidal Thoughts:  No  Homicidal Thoughts:  No  Memory:  Immediate;   Fair Recent;   Fair Remote;   Fair  Judgement:  Intact  Insight:  Fair  Psychomotor Activity:  Normal  Concentration:  Good  Recall:  Good  Fund of Knowledge:Good  Language: Good  Akathisia:  Negative  Handed:  Right  AIMS (if indicated):     Assets:  Communication Skills Desire for Improvement Housing Resilience Social Support Transportation  Sleep:  Number of Hours: 5.5  Cognition: WNL  ADL's:  Intact   Mental Status Per Nursing Assessment::   On Admission:  Suicidal ideation indicated by patient  Demographic Factors:  Male, Low socioeconomic status and Unemployed  Loss Factors: Financial problems/change in socioeconomic status  Historical Factors: Impulsivity  Risk Reduction Factors:   Sense of responsibility to family, Living with another person, especially a relative and Positive social support  Continued Clinical Symptoms:  Depression:   Comorbid  alcohol abuse/dependence Impulsivity Alcohol/Substance Abuse/Dependencies  Cognitive Features That Contribute To Risk:  None    Suicide Risk:  Minimal: No identifiable suicidal ideation.  Patients presenting with no risk factors but with morbid ruminations; may be classified as minimal risk based on the severity of the depressive symptoms  Follow-up Information    [facility] Follow up.   Why: A referral has been made on your behalf to this facility. Contact information: [address] [place] [address] [phone]        [facility]. Call.   Why: A referral has been made on your behalf to this facility.  Please check daily for bed availability.  Contact information: [address] [place] [id number]        [facility] Follow up on 02/06/2020.   Why: You have an appointment on 02/06/20 at 10:30 am.  This will be a virtual tele-health appointment.   Contact information: [address] [place] [address] [phone]           Plan Of Care/Follow-up recommendations:  Activity:  ad lib  Sharma Covert, MD 02/02/2020, 9:28 AM

## 2020-02-02 NOTE — Progress Notes (Signed)
Discharge Note:  Patient discharged home.  Patient denied SI and HI.  Denied A/V hallucinations.  Suicide prevention information given and discussed with patient who stated he understood and had no questions.  Patient stated he received all his belongings, clothing, toiletries, etc. Patient stated he appreciated all assistance received from [facility] staff.

## 2020-02-02 NOTE — Progress Notes (Signed)
  [facility] Adult Case Management Discharge Plan :  Will you be returning to the same living situation after discharge:  Yes,  patient is returning home with his wife and children At discharge, do you have transportation home?: Yes,  CSW arranging Safe Transport Do you have the ability to pay for your medications: No.  Release of information consent forms completed and in the chart;  Patient's signature needed at discharge.  Patient to Follow up at: Follow-up Information    [facility] Follow up on 02/06/2020.   Why: You have an appointment on 02/06/20 at 10:30 am.  This will be a virtual tele-health appointment. Please be sure to bring any discharge paperwork, inlcuding your list of medications.  Contact information: [address] [place] [address] [phone]        [facility]. Go to.   Why: For intensive oupatient services for substance abuse, please go to agency during their walk-in hours. Walk in hours are Mondays, Wednesdays and Fridays from 12:00pm-3:00pm. Please be sure to have your dicharge paperwork available.  Contact information: [address] [place], [place] [address]  Telephone:([phone] [phone]       [facility] Call.   Why: You were accepted for residential substance use treatment, however, a bed has not become available at this time. You need to call daily to check on bed status. Contact information: [place]  [phone]           Next level of care provider has access to [place] and Suicide Prevention discussed: Yes,  with the patient's wife     Has patient been referred to the Quitline?: Patient refused referral  Patient has been referred for addiction treatment: Yes  Marylee Floras, [place] 02/02/2020, 10:51 AM

## 2020-02-02 NOTE — BHH Counselor (Signed)
Patient has been accepted for residential substance use treatment at [facility], however, a bed is not yet available. Patient advised to call and follow up, should he wish to pursue treatment.  Stephanie Acre, MSW, LCSW-A Clinical Social Worker [address] [facility]

## 2020-02-02 NOTE — Discharge Summary (Signed)
Physician Discharge Summary Note  Patient:  Joshua Lewis is an 59 y.o., male MRN:  [id number] DOB:  10-28-1960 Patient phone:  [phone] (home)  Patient address:   [place] [address],  Total Time spent with patient: 30 minutes  Date of Admission:  01/28/2020 Date of Discharge: 02/02/20  Reason for Admission:  59 year old male with a past psychiatric history significant for opiate dependence and opiate withdrawal who presented to the [facility] emergency department on 01/27/2020 with suicidal ideation. The patient stated that he has a longstanding history of opiate dependence. He has been using opiates since around 2010. He started after he had had back surgery. Most recently he is going to use fentanyl approximately 6 months ago. Unfortunately this continued and he did poorly. Most recently he lost his job, and has attempted to quit using fentanyl but has not been successful. His wife is having significant stress over this, and stated that he cannot return home until he gets his drug issues under control. The patient admitted to helplessness, hopelessness and worthlessness. He admitted to suicidal ideation as well as opiate withdrawal.   Principal Problem: MDD (major depressive disorder), severe ([place]) Discharge Diagnoses: Principal Problem:   MDD (major depressive disorder), severe ([place]) Active Problems:   Opiate dependence ([place])   Past Psychiatric History: Patient denied any previous psychiatric admissions, psychiatric medications or psychiatric evaluations.  He stated he had tried to kill himself several months ago, but did not seek assistance for that afterwards.  Past Medical History:  Past Medical History:  Diagnosis Date  . Chronic back pain greater than 3 months duration   . Kidney carcinoma [facility])     Past Surgical History:  Procedure Laterality Date  . HAND SURGERY    . HERNIA REPAIR    . NEPHRECTOMY     right   Family  History:  Family History  Problem Relation Age of Onset  . Cancer Mother   . Diabetes Mother   . Hypertension Mother    Family Psychiatric  History: None reported Social History:  Social History   Substance and Sexual Activity  Alcohol Use No     Social History   Substance and Sexual Activity  Drug Use No    Social History   Socioeconomic History  . Marital status: Married    Spouse name: Not on file  . Number of children: Not on file  . Years of education: Not on file  . Highest education level: Not on file  Occupational History  . Not on file  Tobacco Use  . Smoking status: Current Some Day Smoker    Packs/day: 0.50  . Smokeless tobacco: Never Used  Substance and Sexual Activity  . Alcohol use: No  . Drug use: No  . Sexual activity: Not on file  Other Topics Concern  . Not on file  Social History Narrative  . Not on file   Social Determinants of Health   Financial Resource Strain:   . Difficulty of Paying Living Expenses:   Food Insecurity:   . Worried About Charity fundraiser in the Last Year:   . Arboriculturist in the Last Year:   Transportation Needs:   . Film/video editor (Medical):   Marland Kitchen Lack of Transportation (Non-Medical):   Physical Activity:   . Days of Exercise per Week:   . Minutes of Exercise per Session:   Stress:   . Feeling of Stress :   Social Connections:   .  Frequency of Communication with Friends and Family:   . Frequency of Social Gatherings with Friends and Family:   . Attends Religious Services:   . Active Member of Clubs or Organizations:   . Attends Archivist Meetings:   Marland Kitchen Marital Status:     Hospital Course:  Patient remained on the [facility] unit for 5 days. The patient stabilized on medication and therapy. Patient was discharged on Remeron 15 mg QHS, Zoloft 50 mg Daily, Melatonin 5 mg QHS. Patient has shown improvement with improved mood, affect, sleep, appetite, and interaction. Patient has attended group and  participated. Patient has been seen in the day room interacting with peers and staff appropriately. Patient denies any SI/HI/AVH and contracts for safety. Patient agrees to follow up at [facility]. Patient is provided with prescriptions for their medications upon discharge.  Physical Findings: AIMS: Facial and Oral Movements Muscles of Facial Expression: None, normal Lips and Perioral Area: None, normal Jaw: None, normal Tongue: None, normal,Extremity Movements Upper (arms, wrists, hands, fingers): None, normal Lower (legs, knees, ankles, toes): None, normal, Trunk Movements Neck, shoulders, hips: None, normal, Overall Severity Severity of abnormal movements (highest score from questions above): None, normal Incapacitation due to abnormal movements: None, normal Patient's awareness of abnormal movements (rate only patient's report): No Awareness, Dental Status Current problems with teeth and/or dentures?: No Does patient usually wear dentures?: No  CIWA:  CIWA-Ar Total: 0 COWS:  COWS Total Score: 1  Musculoskeletal: Strength & Muscle Tone: within normal limits Gait & Station: normal Patient leans: N/A  Psychiatric Specialty Exam: Physical Exam  Nursing note and vitals reviewed. Constitutional: He is oriented to person, place, and time. He appears well-developed and well-nourished.  Cardiovascular: Normal rate.  Respiratory: Effort normal.  Musculoskeletal:        General: Normal range of motion.  Neurological: He is alert and oriented to person, place, and time.  Skin: Skin is warm.    Review of Systems  Constitutional: Negative.   HENT: Negative.   Eyes: Negative.   Respiratory: Negative.   Cardiovascular: Negative.   Gastrointestinal: Negative.   Genitourinary: Negative.   Musculoskeletal: Negative.   Skin: Negative.   Neurological: Negative.   Psychiatric/Behavioral: Negative.     Blood pressure (!) 126/95, pulse 74, temperature 98 F (36.7 C), temperature  source Oral, resp. rate 18, height 5\' 9"  (1.753 m), weight 77.1 kg, SpO2 99 %.Body mass index is 25.1 kg/m.   General Appearance: Casual  Eye Contact::  Good  Speech:  Normal Rate409  Volume:  Normal  Mood:  Euthymic  Affect:  Congruent  Thought Process:  Coherent and Descriptions of Associations: Intact  Orientation:  Full (Time, Place, and Person)  Thought Content:  Logical  Suicidal Thoughts:  No  Homicidal Thoughts:  No  Memory:  Immediate;   Fair Recent;   Fair Remote;   Fair  Judgement:  Intact  Insight:  Fair  Psychomotor Activity:  Normal  Concentration:  Good  Recall:  Good  Fund of Knowledge:Good  Language: Good  Akathisia:  Negative  Handed:  Right  AIMS (if indicated):     Assets:  Communication Skills Desire for Improvement Housing Resilience Social Support Transportation  Sleep:  Number of Hours: 5.5  Cognition: WNL  ADL's:  Intact      Has this patient used any form of tobacco in the last 30 days? (Cigarettes, Smokeless Tobacco, Cigars, and/or Pipes) Yes, Yes, A prescription for an FDA-approved tobacco cessation medication was offered  at discharge and the patient refused  Blood Alcohol level:  Lab Results  Component Value Date   ETH <10 123456    Metabolic Disorder Labs:  No results found for: HGBA1C, MPG No results found for: PROLACTIN Lab Results  Component Value Date   CHOL 204 (H) 01/28/2020   TRIG 68 01/28/2020   HDL 36 (L) 01/28/2020   CHOLHDL 5.7 01/28/2020   VLDL 14 01/28/2020   LDLCALC 154 (H) 01/28/2020    See Psychiatric Specialty Exam and Suicide Risk Assessment completed by Attending Physician prior to discharge.  Discharge destination:  Home  Is patient on multiple antipsychotic therapies at discharge:  No   Has Patient had three or more failed trials of antipsychotic monotherapy by history:  No  Recommended Plan for Multiple Antipsychotic Therapies: NA   Allergies as of 02/02/2020   No Known Allergies      Medication List    STOP taking these medications   Benadryl Itch Stopping cream Generic drug: diphenhydrAMINE-zinc acetate   cephALEXin 500 MG capsule Commonly known as: KEFLEX   clotrimazole-betamethasone cream Commonly known as: LOTRISONE   EPINEPHrine 0.3 mg/0.3 mL Soaj injection Commonly known as: EPI-PEN   famotidine 20 MG tablet Commonly known as: PEPCID   furosemide 20 MG tablet Commonly known as: LASIX   HYDROcodone-acetaminophen 5-325 MG tablet Commonly known as: NORCO/VICODIN   ibuprofen 600 MG tablet Commonly known as: ADVIL   loratadine 10 MG tablet Commonly known as: CLARITIN   methocarbamol 500 MG tablet Commonly known as: ROBAXIN   predniSONE 50 MG tablet Commonly known as: DELTASONE     TAKE these medications     Indication  hydrOXYzine 25 MG tablet Commonly known as: ATARAX/VISTARIL Take 1 tablet (25 mg total) by mouth every 6 (six) hours as needed for anxiety.  Indication: Feeling Anxious   melatonin 5 MG Tabs Take 1 tablet (5 mg total) by mouth at bedtime.  Indication: Trouble Sleeping   mirtazapine 15 MG tablet Commonly known as: REMERON Take 1 tablet (15 mg total) by mouth at bedtime.  Indication: sleep   naproxen 500 MG tablet Commonly known as: NAPROSYN Take 1 tablet (500 mg total) by mouth 2 (two) times daily as needed (aching, pain, or discomfort).  Indication: Pain   sertraline 50 MG tablet Commonly known as: ZOLOFT Take 1 tablet (50 mg total) by mouth daily. Start taking on: Feb 03, 2020  Indication: Major Depressive Disorder      Follow-up Information    [facility] Follow up on 02/06/2020.   Why: You have an appointment on 02/06/20 at 10:30 am.  This will be a virtual tele-health appointment. Please be sure to bring any discharge paperwork, inlcuding your list of medications.  Contact information: [address] [place] [address] [phone]        [facility] Drug [facility]. Go to.   Why: For intensive  oupatient services for substance abuse, please go to agency during their walk-in hours. Walk in hours are Mondays, Wednesdays and Fridays from 12:00pm-3:00pm. Please be sure to have your dicharge paperwork available.  Contact information: [address], [place], [place] [address]  Telephone:([phone] [phone]) [phone]          Follow-up recommendations:  Continue activity as tolerated. Continue diet as recommended by your PCP. Ensure to keep all appointments with outpatient providers.  Comments:  Patient is instructed prior to discharge to: Take all medications as prescribed by his/her mental healthcare provider. Report any adverse effects and or reactions  from the medicines to his/her outpatient provider promptly. Patient has been instructed & cautioned: To not engage in alcohol and or illegal drug use while on prescription medicines. In the event of worsening symptoms, patient is instructed to call the crisis hotline, 911 and or go to the nearest ED for appropriate evaluation and treatment of symptoms. To follow-up with his/her primary care provider for your other medical issues, concerns and or health care needs.    Signed: [name] [name] Bohden Dung, FNP 02/02/2020, 9:53 AM

## 2020-02-02 NOTE — Progress Notes (Signed)
Spiritual care group on grief and loss facilitated by chaplain Jerene Pitch  Group Goal:  Support / Education around grief and loss  Members engage in facilitated group support and psycho-social education.  Group Description:  Following introductions and group rules, group members engaged in facilitated group support around topic of loss, with particular support around experiences of loss in their lives. Group Identified types of loss (relationships / self / things) and identified patterns, circumstances, and changes that precipitate losses. Reflected on thoughts / feelings around loss, normalized grief responses, and recognized variety in grief experience.  Group reflected on Worden's Tasks of Grief Group drew on Adlerian and narrative frameworks, as well as Worden's Tasks model  PT PROGRESS  Joshua Lewis was invited to group.  DID NOT ATTEND

## 2020-02-04 ENCOUNTER — Telehealth (INDEPENDENT_AMBULATORY_CARE_PROVIDER_SITE_OTHER): Payer: Self-pay | Admitting: Primary Care

## 2020-02-17 ENCOUNTER — Telehealth (INDEPENDENT_AMBULATORY_CARE_PROVIDER_SITE_OTHER): Payer: Self-pay | Admitting: Primary Care

## 2020-02-17 ENCOUNTER — Ambulatory Visit (INDEPENDENT_AMBULATORY_CARE_PROVIDER_SITE_OTHER): Payer: Self-pay | Admitting: Licensed Clinical Social Worker

## 2020-02-17 ENCOUNTER — Encounter (INDEPENDENT_AMBULATORY_CARE_PROVIDER_SITE_OTHER): Payer: Self-pay | Admitting: Primary Care

## 2020-02-17 ENCOUNTER — Other Ambulatory Visit: Payer: Self-pay

## 2020-02-17 DIAGNOSIS — Z7689 Persons encountering health services in other specified circumstances: Secondary | ICD-10-CM

## 2020-02-17 DIAGNOSIS — Z9289 Personal history of other medical treatment: Secondary | ICD-10-CM

## 2020-02-17 DIAGNOSIS — F1124 Opioid dependence with opioid-induced mood disorder: Secondary | ICD-10-CM

## 2020-02-17 DIAGNOSIS — F322 Major depressive disorder, single episode, severe without psychotic features: Secondary | ICD-10-CM

## 2020-02-17 NOTE — Progress Notes (Addendum)
Virtual Visit via Telephone Note  I connected with Joshua Lewis on 02/17/20 at 11:10 AM EDT by telephone and verified that I am speaking with the correct person using two identifiers.   I discussed the limitations, risks, security and privacy concerns of performing an evaluation and management service by telephone and the availability of in person appointments. I also discussed with the patient that there may be a patient responsible charge related to this service. The patient expressed understanding and agreed to proceed. Patient was at home Joshua Mire, NP was at [facility]  History of Present Illness: Joshua Lewis is a 59 year old male having a tele visit to establish care and hospital follow up.    Past Medical History:  Diagnosis Date  . Chronic back pain greater than 3 months duration   . Kidney carcinoma [facility])    Current Outpatient Medications on File Prior to Visit  Medication Sig Dispense Refill  . hydrOXYzine (ATARAX/VISTARIL) 25 MG tablet Take 1 tablet (25 mg total) by mouth every 6 (six) hours as needed for anxiety. 30 tablet 0  . melatonin 5 MG TABS Take 1 tablet (5 mg total) by mouth at bedtime. 30 tablet 0  . mirtazapine (REMERON) 15 MG tablet Take 1 tablet (15 mg total) by mouth at bedtime. 30 tablet 0  . sertraline (ZOLOFT) 50 MG tablet Take 1 tablet (50 mg total) by mouth daily. 30 tablet 0  . naproxen (NAPROSYN) 500 MG tablet Take 1 tablet (500 mg total) by mouth 2 (two) times daily as needed (aching, pain, or discomfort). (Patient not taking: Reported on 02/17/2020) 30 tablet 0  . [DISCONTINUED] Ibuprofen-Diphenhydramine HCl (ADVIL PM) 200-25 MG CAPS Take 1 tablet by mouth at bedtime. To help sleep     No current facility-administered medications on file prior to visit.   Observations/Objective: Review of Systems  Psychiatric/Behavioral: Positive for depression, substance abuse and suicidal ideas. The patient is nervous/anxious.         Hospitalization last thought of harming self.  All other systems reviewed and are negative.  Assessment and Plan: Kiara was seen today for hospitalization follow-up and medication refill.  Diagnoses and all orders for this visit:  MDD (major depressive disorder), severe ([place])  He is experiencing difficulty managing mental and physical health triggered by financial strain, history of substance use, and psychosocial stressors.  Symptoms include withdrawn behavior, racing thoughts, irritability, and difficulty staying asleep Refilled Zoloft and follow up with CSW  Opioid dependence with opioid-induced mood disorder ([place]) Underlying drug use and suicidal ideation maybe the underlying causative factor admitted to behavioral health treated and discharge when deemed safe.  Encounter to establish care Joshua Mire, NP-C will be your  (PCP) she is mastered prepared . Able to diagnosed and treatment also  answer health concern as well as continuing care of varied medical conditions, not limited by cause, organ system, or diagnosis.    Follow Up Instructions:    I discussed the assessment and treatment plan with the patient. The patient was provided an opportunity to ask questions and all were answered. The patient agreed with the plan and demonstrated an understanding of the instructions.   The patient was advised to call back or seek an in-person evaluation if the symptoms worsen or if the condition fails to improve as anticipated.  I provided 20  minutes of non-face-to-face time during this encounter. Includes review of previous encounters , labs and imaging    Kerin Perna, NP

## 2020-02-20 NOTE — BH Specialist Note (Signed)
Integrated Behavioral Health Visit via Telemedicine (Telephone)  02/17/2020  Joshua Lewis [id number]   Session Start time: 3:00 PM session End time: 3:25 PM Total time: 25  Referring Provider: NP Oletta Lamas Type of Visit: Telephonic Patient location: Home [facility] Provider location: Office All persons participating in visit: LCSW and patient  Confirmed patient's address: Yes  Confirmed patient's phone number: Yes  Any changes to demographics: No   Confirmed patient's insurance: Yes  Any changes to patient's insurance: No   Discussed confidentiality: Yes    The following statements were read to the patient and/or legal guardian that are established with the [facility] Provider.  "The purpose of this phone visit is to provide behavioral health care while limiting exposure to the coronavirus (COVID19).  There is a possibility of technology failure and discussed alternative modes of communication if that failure occurs."  "By engaging in this telephone visit, you consent to the provision of healthcare.  Additionally, you authorize for your insurance to be billed for the services provided during this telephone visit."   Patient and/or legal guardian consented to telephone visit: Yes   PRESENTING CONCERNS: Patient and/or family reports the following symptoms/concerns: Patient reports difficulty managing mental and physical health conditions triggered by psychosocial stressors and history of substance use Duration of problem: Ongoing; Severity of problem: moderate  STRENGTHS (Protective Factors/Coping Skills): Patient has support system Patient has desire for change  GOALS ADDRESSED: Patient will: 1.  Reduce symptoms of: anxiety, depression and stress  2.  Increase knowledge and/or ability of: coping skills and healthy habits  3.  Demonstrate ability to: Increase healthy adjustment to current life circumstances, Increase adequate support systems for patient/family, Increase  motivation to adhere to plan of care and Decrease self-medicating behaviors  INTERVENTIONS: Interventions utilized:  Solution-Focused Strategies, Supportive Counseling, Psychoeducation and/or Health Education and Link to [employer] Standardized Assessments completed: Not Needed  ASSESSMENT: Patient currently experiencing difficulty managing mental and physical health triggered by financial strain, history of substance use, and psychosocial stressors.  Symptoms include withdrawn behavior, racing thoughts, irritability, and difficulty staying asleep.  Patient shared that he ran out of medications due to financial strain approximately 4 days ago.   Patient may benefit from medication management and psychotherapy.  Yes W discussed correlation between ones physical and mental health, in addition, to how substance use can negatively impact both.  Patient was informed of financial counseling program to assist with medical cost in addition to programs at [employer] and wellness centers pharmacy.  Therapeutic strategies to assist in decreasing and/or managing symptoms were discussed.  Patient was strongly encouraged to schedule follow-up appointment with PCP.  Patient is not interested in inpatient or outpatient substance treatment programs at this time  PLAN: 1. Follow up with behavioral health clinician on : Pt was encouraged to contact [place] if symptoms worsen or fail to improve to schedule behavioral appointments at [facility]. 2. Behavioral recommendations: Utilize strategies discussed, pick up medication, and apply to financial counseling to assist with medical bills 3. Referral(s): [place] (In Clinic)  Rebekah Chesterfield, [place] 02/20/20 9:11 AM

## 2020-03-06 MED ORDER — SERTRALINE HCL 50 MG PO TABS: 50.0000 mg | ORAL_TABLET | Freq: Every day | ORAL | 0 refills | Status: DC

## 2020-05-13 ENCOUNTER — Other Ambulatory Visit: Payer: Self-pay

## 2020-05-13 ENCOUNTER — Emergency Department (HOSPITAL_COMMUNITY)
Admission: EM | Admit: 2020-05-13 | Discharge: 2020-05-13 | Disposition: A | Discharge status: Home / Self Care | Payer: 59 | Attending: Emergency Medicine | Admitting: Emergency Medicine

## 2020-05-13 ENCOUNTER — Encounter (HOSPITAL_COMMUNITY): Payer: Self-pay | Admitting: Emergency Medicine

## 2020-05-13 ENCOUNTER — Emergency Department (HOSPITAL_COMMUNITY): Payer: 59

## 2020-05-13 DIAGNOSIS — F17200 Nicotine dependence, unspecified, uncomplicated: Secondary | ICD-10-CM | POA: Insufficient documentation

## 2020-05-13 DIAGNOSIS — J9811 Atelectasis: Secondary | ICD-10-CM | POA: Diagnosis not present

## 2020-05-13 DIAGNOSIS — R079 Chest pain, unspecified: Secondary | ICD-10-CM | POA: Diagnosis present

## 2020-05-13 DIAGNOSIS — R0789 Other chest pain: Secondary | ICD-10-CM | POA: Insufficient documentation

## 2020-05-13 DIAGNOSIS — Z79899 Other long term (current) drug therapy: Secondary | ICD-10-CM | POA: Insufficient documentation

## 2020-05-13 DIAGNOSIS — Z85520 Personal history of malignant carcinoid tumor of kidney: Secondary | ICD-10-CM | POA: Insufficient documentation

## 2020-05-13 LAB — CBC
HCT: 38.3 % — ABNORMAL LOW (ref 39.0–52.0)
Hemoglobin: 11.9 g/dL — ABNORMAL LOW (ref 13.0–17.0)
MCH: 28.8 pg (ref 26.0–34.0)
MCHC: 31.1 g/dL (ref 30.0–36.0)
MCV: 92.7 fL (ref 80.0–100.0)
Platelets: 261 10*3/uL (ref 150–400)
RBC: 4.13 MIL/uL — ABNORMAL LOW (ref 4.22–5.81)
RDW: 13.6 % (ref 11.5–15.5)
WBC: 9.9 10*3/uL (ref 4.0–10.5)
nRBC: 0 % (ref 0.0–0.2)

## 2020-05-13 LAB — BASIC METABOLIC PANEL
Anion gap: 11 (ref 5–15)
BUN: 12 mg/dL (ref 6–20)
CO2: 25 mmol/L (ref 22–32)
Calcium: 9.4 mg/dL (ref 8.9–10.3)
Chloride: 102 mmol/L (ref 98–111)
Creatinine, Ser: 1.11 mg/dL (ref 0.61–1.24)
GFR calc Af Amer: >60 mL/min (ref >60)
GFR calc non Af Amer: >60 mL/min (ref >60)
Glucose, Bld: 105 mg/dL — ABNORMAL HIGH (ref 70–99)
Potassium: 4.3 mmol/L (ref 3.5–5.1)
Sodium: 138 mmol/L (ref 135–145)

## 2020-05-13 LAB — SEDIMENTATION RATE: Sed Rate: 14 mm/hr (ref 0–16)

## 2020-05-13 LAB — LACTIC ACID, PLASMA: Lactic Acid, Venous: 1.2 mmol/L (ref 0.5–1.9)

## 2020-05-13 LAB — TROPONIN I (HIGH SENSITIVITY)
Troponin I (High Sensitivity): 5 ng/L (ref <18)
Troponin I (High Sensitivity): 5 ng/L (ref <18)

## 2020-05-13 MED ORDER — FAMOTIDINE 20 MG PO TABS: 20.0000 mg | ORAL_TABLET | Freq: Two times a day (BID) | ORAL | 0 refills | Status: DC

## 2020-05-13 MED ORDER — NAPROXEN 375 MG PO TABS: 375.0000 mg | ORAL_TABLET | Freq: Two times a day (BID) | ORAL | 0 refills | Status: DC

## 2020-05-13 MED ORDER — SODIUM CHLORIDE 0.9 % IV BOLUS
1000.0000 mL | Freq: Once | INTRAVENOUS | Status: AC
  Administered 2020-05-13: 1000 mL via INTRAVENOUS

## 2020-05-13 MED ORDER — KETOROLAC TROMETHAMINE 30 MG/ML IJ SOLN
30.0000 mg | Freq: Once | INTRAMUSCULAR | Status: AC
  Administered 2020-05-13: 30 mg via INTRAVENOUS
  Filled 2020-05-13: qty 1

## 2020-05-13 MED ORDER — IOHEXOL 350 MG/ML SOLN
75.0000 mL | Freq: Once | INTRAVENOUS | Status: AC | PRN
  Administered 2020-05-13: 75 mL via INTRAVENOUS

## 2020-05-13 MED ORDER — AMOXICILLIN-POT CLAVULANATE 875-125 MG PO TABS: 1.0000 | ORAL_TABLET | Freq: Two times a day (BID) | ORAL | 0 refills | Status: DC

## 2020-05-13 NOTE — Discharge Instructions (Addendum)
1.  You had a CT scan done your chest.  It was no evidence of blood clot or evident cancerous tumors.  It did identify a condition called atelectasis or possible early pneumonia.  For this reason, you will be placed on an antibiotic for 1 week.  Twice daily as prescribed.  You may also take Naproxen (an anti-inflammatory medication) for pain in the chest wall area.  While you are taking naproxen and Augmentin, take Pepcid to protect your stomach from inflammation and irritation.  Take Pepcid twice daily and take your medications with a small amount of food on your stomach. 2.  Follow-up with your doctor for recheck.  It is very important that you have a recheck to make sure that your symptoms are resolving and no new or concerning symptoms are developing. 3.  Return to the emergency department if you develop worsening pain, shortness of breath increased swelling or other concerning symptoms.

## 2020-05-13 NOTE — ED Notes (Signed)
Pt states he received his first moderna COVID vaccine 3 days ago

## 2020-05-13 NOTE — ED Triage Notes (Signed)
Patient here with lump in center of chest, warm to touch and painful to touch.  Patient has chest pain at the lump, upon palpation.  Patient denies any nausea or vomiting, mild shortness of breath.

## 2020-05-13 NOTE — ED Provider Notes (Signed)
[facility] EMERGENCY DEPARTMENT Provider Note   CSN: [id number] Arrival date & time: 05/13/20  9937     History Chief Complaint  Patient presents with   Chest Pain    Joshua Lewis is a 59 y.o. male.  HPI Patient began developing an achy discomfort in his upper chest about 2 days earlier.  He then started to note that there was some tender, visible swelling in the left upper chest.  He has not had any cough.  Patient denies any associated shortness of breath, fever.  No diarrhea no vomiting.  He has noted some mild swelling in the ankles.  Patient has had first of Covid vaccine.  Patient has prior history of renal carcinoma with resection of right kidney.  Patient denies any history of drugs of abuse.  No alcohol use, positive for cigarette smoking.    Past Medical History:  Diagnosis Date   Chronic back pain greater than 3 months duration    Kidney carcinoma [facility])     Patient Active Problem List   Diagnosis Date Noted   MDD (major depressive disorder), severe ([place]) 01/28/2020   Opiate dependence ([place]) 01/28/2020    Past Surgical History:  Procedure Laterality Date   HAND SURGERY     HERNIA REPAIR     NEPHRECTOMY     right       Family History  Problem Relation Age of Onset   Cancer Mother    Diabetes Mother    Hypertension Mother     Social History   Tobacco Use   Smoking status: Current Some Day Smoker    Packs/day: 0.50   Smokeless tobacco: Never Used  Vaping Use   Vaping Use: Never used  Substance Use Topics   Alcohol use: No   Drug use: No    Home Medications Prior to Admission medications   Medication Sig Start Date End Date Taking? Authorizing Provider  amoxicillin-clavulanate (AUGMENTIN) 875-125 MG tablet Take 1 tablet by mouth 2 (two) times daily. One po bid x 7 days 05/13/20   Charlesetta Shanks, MD  famotidine (PEPCID) 20 MG tablet Take 1 tablet (20 mg total) by mouth 2 (two) times daily. 05/13/20   Charlesetta Shanks,  MD  hydrOXYzine (ATARAX/VISTARIL) 25 MG tablet Take 1 tablet (25 mg total) by mouth every 6 (six) hours as needed for anxiety. 02/02/20   Money, [name] [name], FNP  melatonin 5 MG TABS Take 1 tablet (5 mg total) by mouth at bedtime. 02/02/20   Money, [name] [name], FNP  mirtazapine (REMERON) 15 MG tablet Take 1 tablet (15 mg total) by mouth at bedtime. 02/02/20   Money, [name] [name], FNP  naproxen (NAPROSYN) 375 MG tablet Take 1 tablet (375 mg total) by mouth 2 (two) times daily. 05/13/20   Charlesetta Shanks, MD  sertraline (ZOLOFT) 50 MG tablet Take 1 tablet (50 mg total) by mouth daily. 03/06/20   Kerin Perna, NP  Ibuprofen-Diphenhydramine HCl (ADVIL PM) 200-25 MG CAPS Take 1 tablet by mouth at bedtime. To help sleep  11/25/11  [provider]    Allergies    Patient has no known allergies.  Review of Systems   Review of Systems 10 systems reviewed and negative except as per HPI Physical Exam Updated Vital Signs BP (!) 135/91    Pulse (!) 55    Temp 98.9 F (37.2 C) (Oral)    Resp 15    Ht 5\' 9"  (1.753 m)    Wt 77.1 kg  SpO2 95%    BMI 25.10 kg/m   Physical Exam Constitutional:      Comments: Alert and nontoxic.  No respiratory distress at rest.  Mental status clear.  HENT:     Mouth/Throat:     Pharynx: Oropharynx is clear.  Eyes:     Extraocular Movements: Extraocular movements intact.     Conjunctiva/sclera: Conjunctivae normal.  Neck:     Comments: No cervical lymphadenopathy. Cardiovascular:     Rate and Rhythm: Normal rate and regular rhythm.  Pulmonary:     Effort: Pulmonary effort is normal.     Breath sounds: Normal breath sounds.     Comments: Patient does have a mildly swollen tender area on the left anterior chest wall.  This corresponds to first or second rib insertion just inferior to the sternal clavicular head.  No overlying lesions. Abdominal:     General: There is no distension.     Palpations: Abdomen is soft.     Tenderness: There is no abdominal tenderness.  There is no guarding.     Comments: Patient has scars of the anterior abdominal wall all well-healed.  No tenderness.  Musculoskeletal:        General: Normal range of motion.     Cervical back: Neck supple.     Comments: Trace to 1 pitting edema of the lower legs bilaterally.  Calves nontender.  Feet without wounds or significant swelling.  Skin:    General: Skin is warm and dry.  Neurological:     General: No focal deficit present.     Mental Status: He is oriented to person, place, and time.     Coordination: Coordination normal.  Psychiatric:        Mood and Affect: Mood normal.     ED Results / Procedures / Treatments   Labs (all labs ordered are listed, but only abnormal results are displayed) Labs Reviewed  BASIC METABOLIC PANEL - Abnormal; Notable for the following components:      Result Value   Glucose, Bld 105 (*)    All other components within normal limits  CBC - Abnormal; Notable for the following components:   RBC 4.13 (*)    Hemoglobin 11.9 (*)    HCT 38.3 (*)    All other components within normal limits  LACTIC ACID, PLASMA  SEDIMENTATION RATE  TROPONIN I (HIGH SENSITIVITY)  TROPONIN I (HIGH SENSITIVITY)    EKG EKG Interpretation  Date/Time:  Thursday May 13 2020 07:01:09 EDT Ventricular Rate:  64 PR Interval:  162 QRS Duration: 76 QT Interval:  390 QTC Calculation: 402 R Axis:   15 Text Interpretation: Normal sinus rhythm Right atrial enlargement Low voltage QRS Borderline ECG no acute ischemic appearance, similar to older tracings Confirmed by Charlesetta Shanks [phone]) on 05/13/2020 1:31:48 PM   Radiology DG Chest 2 View  Result Date: 05/13/2020 CLINICAL DATA:  Chest pain. EXAM: CHEST - 2 VIEW COMPARISON:  01/27/2020 FINDINGS: Lungs are hyperexpanded. Cardiopericardial silhouette is at upper limits of normal for size. Interstitial markings are diffusely coarsened with chronic features. New patchy airspace disease at the left base may be related  to atelectasis or pneumonia. Bones are diffusely demineralized. IMPRESSION: 1. New patchy airspace disease at the left base compatible with atelectasis or pneumonia. Electronically Signed   By: Misty Stanley M.D.   On: 05/13/2020 07:41   CT Angio Chest PE W/Cm &/Or Wo Cm  Result Date: 05/13/2020 CLINICAL DATA:  Chest pain. Short of breath. Left lung  base opacity noted on the current chest radiograph. EXAM: CT ANGIOGRAPHY CHEST WITH CONTRAST TECHNIQUE: Multidetector CT imaging of the chest was performed using the standard protocol during bolus administration of intravenous contrast. Multiplanar CT image reconstructions and MIPs were obtained to evaluate the vascular anatomy. CONTRAST:  56mL OMNIPAQUE IOHEXOL 350 MG/ML SOLN COMPARISON:  Chest radiograph, 05/13/2020. FINDINGS: Cardiovascular: There is satisfactory opacification of the pulmonary arteries to the segmental level. There is no evidence of a pulmonary embolism. Heart is normal in size. No pericardial effusion. No coronary artery calcifications. Great vessels are normal in caliber. Aorta is not opacified. Mediastinum/Nodes: No enlarged mediastinal, hilar, or axillary lymph nodes. Thyroid gland, trachea, and esophagus demonstrate no significant findings. Lungs/Pleura: There is dependent opacity in the lower lobes consistent with atelectasis. Additional mild opacities noted diaphragmatic base the left upper lobe lingula and right middle lobe. This is also likely atelectasis. Remainder of the lungs is clear. No pleural effusion. No pneumothorax. Upper Abdomen: No acute findings. Well-defined 2.5 cm low-density lesion in the left lobe of the liver, which has increased in size from a CT of the abdomen pelvis dated 10/26/2013. This is most likely a cyst. Musculoskeletal: No fracture or acute finding. No osteoblastic or osteolytic lesions. Review of the MIP images confirms the above findings. IMPRESSION: 1. No evidence of a pulmonary embolism. 2. Dependent lung  base opacity noted in the lower lobes, and to a lesser degree at the inferior bases of the left upper lobe lingula and right middle lobe. This is most likely all due to atelectasis. A component infection is not excluded. 3. No other evidence of acute cardiopulmonary disease. No pulmonary edema or pleural effusion. Electronically Signed   By: Lajean Manes M.D.   On: 05/13/2020 13:21    Procedures Procedures (including critical care time)  Medications Ordered in ED Medications  ketorolac (TORADOL) 30 MG/ML injection 30 mg (30 mg Intravenous Given 05/13/20 1049)  sodium chloride 0.9 % bolus 1,000 mL (0 mLs Intravenous Stopped 05/13/20 1314)  iohexol (OMNIPAQUE) 350 MG/ML injection 75 mL (75 mLs Intravenous Contrast Given 05/13/20 1304)    ED Course  I have reviewed the triage vital signs and the nursing notes.  Pertinent labs & imaging results that were available during my care of the patient were reviewed by me and considered in my medical decision making (see chart for details).    MDM Rules/Calculators/A&P                          Patient presents as outlined above.  He does have an objectively mild swelling of the costosternal joint on the left.  There is no associated soft tissue changes to suggest abscess, low suspicion for cellulitis. No history of IV drug abuse or risk factor for osteomyelitis.  Patient is nontoxic without fever.  He does have history of renal cancer, CT scan does not show any changes concerning for neoplasm or bony metastases.  There is atelectasis versus early pneumonia identified.  Patient does not have leukocytosis or fever.  No findings suggestive of Covid.  He has had first vaccine dose for Covid.  Will opt to treat with a course of Augmentin empirically for parenchymal changes identified on CT scan.  Recommendation for close follow-up with PCP to monitor response to treatment and return precautions reviewed. Final Clinical Impression(s) / ED Diagnoses Final diagnoses:    Chest wall pain  Atelectasis, bilateral    Rx / DC Orders ED Discharge  Orders         Ordered    amoxicillin-clavulanate (AUGMENTIN) 875-125 MG tablet  2 times daily        05/13/20 1343    naproxen (NAPROSYN) 375 MG tablet  2 times daily        05/13/20 1343    famotidine (PEPCID) 20 MG tablet  2 times daily        05/13/20 1344           Charlesetta Shanks, MD 05/13/20 1349

## 2020-08-08 ENCOUNTER — Emergency Department (HOSPITAL_COMMUNITY): Payer: 59

## 2020-08-08 ENCOUNTER — Inpatient Hospital Stay (HOSPITAL_COMMUNITY)
Admission: EM | Admit: 2020-08-08 | Discharge: 2020-08-10 | DRG: 369 | Disposition: A | Discharge status: Home / Self Care | Payer: 59 | Attending: Internal Medicine | Admitting: Internal Medicine

## 2020-08-08 ENCOUNTER — Other Ambulatory Visit: Payer: Self-pay

## 2020-08-08 ENCOUNTER — Encounter (HOSPITAL_COMMUNITY): Payer: Self-pay | Admitting: Emergency Medicine

## 2020-08-08 DIAGNOSIS — F17210 Nicotine dependence, cigarettes, uncomplicated: Secondary | ICD-10-CM | POA: Diagnosis present

## 2020-08-08 DIAGNOSIS — K2101 Gastro-esophageal reflux disease with esophagitis, with bleeding: Principal | ICD-10-CM | POA: Diagnosis present

## 2020-08-08 DIAGNOSIS — K267 Chronic duodenal ulcer without hemorrhage or perforation: Secondary | ICD-10-CM | POA: Diagnosis present

## 2020-08-08 DIAGNOSIS — R066 Hiccough: Secondary | ICD-10-CM | POA: Diagnosis present

## 2020-08-08 DIAGNOSIS — F1120 Opioid dependence, uncomplicated: Secondary | ICD-10-CM | POA: Diagnosis present

## 2020-08-08 DIAGNOSIS — K219 Gastro-esophageal reflux disease without esophagitis: Secondary | ICD-10-CM | POA: Diagnosis not present

## 2020-08-08 DIAGNOSIS — F32A Depression, unspecified: Secondary | ICD-10-CM | POA: Diagnosis present

## 2020-08-08 DIAGNOSIS — R140 Abdominal distension (gaseous): Secondary | ICD-10-CM

## 2020-08-08 DIAGNOSIS — N179 Acute kidney failure, unspecified: Secondary | ICD-10-CM | POA: Diagnosis present

## 2020-08-08 DIAGNOSIS — K922 Gastrointestinal hemorrhage, unspecified: Secondary | ICD-10-CM

## 2020-08-08 DIAGNOSIS — Z20822 Contact with and (suspected) exposure to covid-19: Secondary | ICD-10-CM | POA: Diagnosis present

## 2020-08-08 DIAGNOSIS — K449 Diaphragmatic hernia without obstruction or gangrene: Secondary | ICD-10-CM | POA: Diagnosis present

## 2020-08-08 DIAGNOSIS — Z905 Acquired absence of kidney: Secondary | ICD-10-CM | POA: Diagnosis not present

## 2020-08-08 DIAGNOSIS — R112 Nausea with vomiting, unspecified: Secondary | ICD-10-CM

## 2020-08-08 DIAGNOSIS — E860 Dehydration: Secondary | ICD-10-CM | POA: Diagnosis present

## 2020-08-08 DIAGNOSIS — T39395A Adverse effect of other nonsteroidal anti-inflammatory drugs [NSAID], initial encounter: Secondary | ICD-10-CM | POA: Diagnosis present

## 2020-08-08 DIAGNOSIS — K2210 Ulcer of esophagus without bleeding: Secondary | ICD-10-CM | POA: Diagnosis present

## 2020-08-08 DIAGNOSIS — Z85528 Personal history of other malignant neoplasm of kidney: Secondary | ICD-10-CM | POA: Diagnosis not present

## 2020-08-08 DIAGNOSIS — Z79899 Other long term (current) drug therapy: Secondary | ICD-10-CM | POA: Diagnosis not present

## 2020-08-08 DIAGNOSIS — K920 Hematemesis: Secondary | ICD-10-CM | POA: Diagnosis present

## 2020-08-08 LAB — OCCULT BLOOD GASTRIC / DUODENUM (SPECIMEN CUP): Occult Blood, Gastric: POSITIVE — AB

## 2020-08-08 LAB — RESP PANEL BY RT-PCR (FLU A&B, COVID) ARPGX2
Influenza A by PCR: NEGATIVE
Influenza B by PCR: NEGATIVE
SARS Coronavirus 2 by RT PCR: NEGATIVE

## 2020-08-08 LAB — TYPE AND SCREEN
ABO/RH(D): B POS
Antibody Screen: NEGATIVE

## 2020-08-08 LAB — URINALYSIS, ROUTINE W REFLEX MICROSCOPIC
Bacteria, UA: NONE SEEN
Bilirubin Urine: NEGATIVE
Glucose, UA: NEGATIVE mg/dL
Ketones, ur: NEGATIVE mg/dL
Leukocytes,Ua: NEGATIVE
Nitrite: NEGATIVE
Protein, ur: 30 mg/dL — AB
Specific Gravity, Urine: 1.019 (ref 1.005–1.030)
pH: 5 (ref 5.0–8.0)

## 2020-08-08 LAB — CBC
HCT: 46.2 % (ref 39.0–52.0)
Hemoglobin: 14.7 g/dL (ref 13.0–17.0)
MCH: 28.4 pg (ref 26.0–34.0)
MCHC: 31.8 g/dL (ref 30.0–36.0)
MCV: 89.2 fL (ref 80.0–100.0)
Platelets: 222 10*3/uL (ref 150–400)
RBC: 5.18 MIL/uL (ref 4.22–5.81)
RDW: 13.2 % (ref 11.5–15.5)
WBC: 9.3 10*3/uL (ref 4.0–10.5)
nRBC: 0 % (ref 0.0–0.2)

## 2020-08-08 LAB — COMPREHENSIVE METABOLIC PANEL
ALT: 20 U/L (ref 0–44)
AST: 34 U/L (ref 15–41)
Albumin: 4 g/dL (ref 3.5–5.0)
Alkaline Phosphatase: 85 U/L (ref 38–126)
Anion gap: 17 — ABNORMAL HIGH (ref 5–15)
BUN: 24 mg/dL — ABNORMAL HIGH (ref 6–20)
CO2: 21 mmol/L — ABNORMAL LOW (ref 22–32)
Calcium: 8.9 mg/dL (ref 8.9–10.3)
Chloride: 93 mmol/L — ABNORMAL LOW (ref 98–111)
Creatinine, Ser: 2.43 mg/dL — ABNORMAL HIGH (ref 0.61–1.24)
GFR, Estimated: 30 mL/min — ABNORMAL LOW (ref >60)
Glucose, Bld: 144 mg/dL — ABNORMAL HIGH (ref 70–99)
Potassium: 4.3 mmol/L (ref 3.5–5.1)
Sodium: 131 mmol/L — ABNORMAL LOW (ref 135–145)
Total Bilirubin: 0.4 mg/dL (ref 0.3–1.2)
Total Protein: 7.2 g/dL (ref 6.5–8.1)

## 2020-08-08 LAB — SODIUM, URINE, RANDOM: Sodium, Ur: 16 mmol/L

## 2020-08-08 LAB — ABO/RH: ABO/RH(D): B POS

## 2020-08-08 LAB — CREATININE, URINE, RANDOM: Creatinine, Urine: 179.7 mg/dL

## 2020-08-08 LAB — POC OCCULT BLOOD, ED: Fecal Occult Bld: POSITIVE — AB

## 2020-08-08 MED ORDER — BACLOFEN 10 MG PO TABS
10.0000 mg | ORAL_TABLET | Freq: Three times a day (TID) | ORAL | Status: DC
  Administered 2020-08-08 – 2020-08-10 (×4): 10 mg via ORAL
  Filled 2020-08-08 (×4): qty 1

## 2020-08-08 MED ORDER — ONDANSETRON HCL 4 MG/2ML IJ SOLN
4.0000 mg | Freq: Once | INTRAMUSCULAR | Status: AC
  Administered 2020-08-08: 4 mg via INTRAVENOUS
  Filled 2020-08-08: qty 2

## 2020-08-08 MED ORDER — SENNOSIDES-DOCUSATE SODIUM 8.6-50 MG PO TABS: 2.0000 | ORAL_TABLET | Freq: Every evening | ORAL | Status: DC | PRN

## 2020-08-08 MED ORDER — PROMETHAZINE HCL 25 MG PO TABS: 12.5000 mg | ORAL_TABLET | Freq: Four times a day (QID) | ORAL | Status: DC | PRN

## 2020-08-08 MED ORDER — PANTOPRAZOLE SODIUM 40 MG IV SOLR
40.0000 mg | Freq: Once | INTRAVENOUS | Status: AC
  Administered 2020-08-08: 40 mg via INTRAVENOUS
  Filled 2020-08-08: qty 40

## 2020-08-08 MED ORDER — SODIUM CHLORIDE 0.9% FLUSH
3.0000 mL | Freq: Two times a day (BID) | INTRAVENOUS | Status: DC
  Administered 2020-08-09 – 2020-08-10 (×2): 3 mL via INTRAVENOUS

## 2020-08-08 MED ORDER — SODIUM CHLORIDE 0.9 % IV SOLN: INTRAVENOUS | Status: DC

## 2020-08-08 MED ORDER — SODIUM CHLORIDE 0.9 % IV BOLUS
1000.0000 mL | Freq: Once | INTRAVENOUS | Status: AC
  Administered 2020-08-08: 1000 mL via INTRAVENOUS

## 2020-08-08 MED ORDER — BACLOFEN 10 MG PO TABS
10.0000 mg | ORAL_TABLET | Freq: Three times a day (TID) | ORAL | Status: DC
  Administered 2020-08-08: 10 mg via ORAL
  Filled 2020-08-08 (×3): qty 1

## 2020-08-08 MED ORDER — LACTATED RINGERS IV SOLN: INTRAVENOUS | Status: AC

## 2020-08-08 MED ORDER — METOCLOPRAMIDE HCL 5 MG/ML IJ SOLN
10.0000 mg | Freq: Once | INTRAMUSCULAR | Status: AC
  Administered 2020-08-08: 10 mg via INTRAVENOUS
  Filled 2020-08-08: qty 2

## 2020-08-08 MED ORDER — ACETAMINOPHEN 325 MG PO TABS: 650.0000 mg | ORAL_TABLET | Freq: Four times a day (QID) | ORAL | Status: DC | PRN

## 2020-08-08 MED ORDER — METOCLOPRAMIDE HCL 5 MG/ML IJ SOLN
5.0000 mg | Freq: Three times a day (TID) | INTRAMUSCULAR | Status: AC
  Administered 2020-08-08 – 2020-08-09 (×5): 5 mg via INTRAVENOUS
  Filled 2020-08-08 (×5): qty 2

## 2020-08-08 MED ORDER — ACETAMINOPHEN 650 MG RE SUPP: 650.0000 mg | Freq: Four times a day (QID) | RECTAL | Status: DC | PRN

## 2020-08-08 MED ORDER — LACTATED RINGERS IV BOLUS
1000.0000 mL | Freq: Once | INTRAVENOUS | Status: AC
  Administered 2020-08-08: 1000 mL via INTRAVENOUS

## 2020-08-08 MED ORDER — PANTOPRAZOLE SODIUM 40 MG IV SOLR
40.0000 mg | Freq: Two times a day (BID) | INTRAVENOUS | Status: DC
  Administered 2020-08-08 – 2020-08-09 (×3): 40 mg via INTRAVENOUS
  Filled 2020-08-08 (×3): qty 40

## 2020-08-08 NOTE — ED Provider Notes (Signed)
[facility] [place] [facility] EMERGENCY DEPARTMENT Provider Note   CSN: [id number] Arrival date & time: 08/08/20  0801     History Chief Complaint  Patient presents with  . Hiccups  . black emesis    Joshua Lewis is a 59 y.o. male.  Patient with history of kidney cancer status post right sided nephrectomy presents to the emergency department for 3 days of vomiting.  Vomiting has been dark in color.  It has been pretty persistent but there has been times when he is able to drink fluids.  Last drink apple juice and orange juice prior to arrival today.  He denies any stool changes.  No chest or abdominal pain.  No shortness of breath.  He denies heavy alcohol or NSAID use.  Denies history of stomach ulcers.  No urinary symptoms.  No treatments prior to arrival.        Past Medical History:  Diagnosis Date  . Chronic back pain greater than 3 months duration   . Kidney carcinoma [facility])     Patient Active Problem List   Diagnosis Date Noted  . MDD (major depressive disorder), severe ([place]) 01/28/2020  . Opiate dependence ([place]) 01/28/2020    Past Surgical History:  Procedure Laterality Date  . HAND SURGERY    . HERNIA REPAIR    . NEPHRECTOMY     right       Family History  Problem Relation Age of Onset  . Cancer Mother   . Diabetes Mother   . Hypertension Mother     Social History   Tobacco Use  . Smoking status: Current Some Day Smoker    Packs/day: 0.50  . Smokeless tobacco: Never Used  Vaping Use  . Vaping Use: Never used  Substance Use Topics  . Alcohol use: No  . Drug use: No    Home Medications Prior to Admission medications   Medication Sig Start Date End Date Taking? Authorizing Provider  amoxicillin-clavulanate (AUGMENTIN) 875-125 MG tablet Take 1 tablet by mouth 2 (two) times daily. One po bid x 7 days 05/13/20   Charlesetta Shanks, MD  famotidine (PEPCID) 20 MG tablet Take 1 tablet (20 mg total) by mouth 2 (two) times daily. 05/13/20   Charlesetta Shanks,  MD  hydrOXYzine (ATARAX/VISTARIL) 25 MG tablet Take 1 tablet (25 mg total) by mouth every 6 (six) hours as needed for anxiety. 02/02/20   Money, [name] [name], FNP  melatonin 5 MG TABS Take 1 tablet (5 mg total) by mouth at bedtime. 02/02/20   Money, [name] [name], FNP  mirtazapine (REMERON) 15 MG tablet Take 1 tablet (15 mg total) by mouth at bedtime. 02/02/20   Money, [name] [name], FNP  naproxen (NAPROSYN) 375 MG tablet Take 1 tablet (375 mg total) by mouth 2 (two) times daily. 05/13/20   Charlesetta Shanks, MD  sertraline (ZOLOFT) 50 MG tablet Take 1 tablet (50 mg total) by mouth daily. 03/06/20   Kerin Perna, NP  Ibuprofen-Diphenhydramine HCl (ADVIL PM) 200-25 MG CAPS Take 1 tablet by mouth at bedtime. To help sleep  11/25/11  [provider]    Allergies    Patient has no known allergies.  Review of Systems   Review of Systems  Constitutional: Negative for fever.  HENT: Negative for rhinorrhea and sore throat.   Eyes: Negative for redness.  Respiratory: Negative for cough.   Cardiovascular: Negative for chest pain.  Gastrointestinal: Positive for nausea and vomiting. Negative for abdominal pain, blood in stool and diarrhea.  Genitourinary:  Negative for dysuria and hematuria.  Musculoskeletal: Negative for myalgias.  Skin: Negative for rash.  Neurological: Negative for headaches.    Physical Exam Updated Vital Signs BP (!) 189/98 (BP Location: Left Arm)   Pulse 100   Temp 97.7 F (36.5 C) (Oral)   Resp (!) 22   SpO2 96%   Physical Exam Vitals and nursing note reviewed.  Constitutional:      General: He is in acute distress.     Appearance: He is well-developed.     Comments: Patient appears uncomfortable.  He is vomiting actively into a bag.  Not coffee-ground per se, but dark-colored fluid noted.  HENT:     Head: Normocephalic and atraumatic.     Mouth/Throat:     Mouth: Mucous membranes are moist.  Eyes:     General:        Right eye: No discharge.        Left eye: No  discharge.     Conjunctiva/sclera: Conjunctivae normal.  Cardiovascular:     Rate and Rhythm: Normal rate and regular rhythm.     Heart sounds: Normal heart sounds.  Pulmonary:     Effort: Pulmonary effort is normal.     Breath sounds: Normal breath sounds.  Abdominal:     Palpations: Abdomen is soft.     Tenderness: There is no abdominal tenderness. There is no guarding or rebound.  Genitourinary:    Rectum: Guaiac result positive (brown stool on glove, no pain).  Musculoskeletal:     Cervical back: Normal range of motion and neck supple.  Skin:    General: Skin is warm and dry.  Neurological:     Mental Status: He is alert.     ED Results / Procedures / Treatments   Labs (all labs ordered are listed, but only abnormal results are displayed) Labs Reviewed  COMPREHENSIVE METABOLIC PANEL - Abnormal; Notable for the following components:      Result Value   Sodium 131 (*)    Chloride 93 (*)    CO2 21 (*)    Glucose, Bld 144 (*)    BUN 24 (*)    Creatinine, Ser 2.43 (*)    GFR, Estimated 30 (*)    Anion gap 17 (*)    All other components within normal limits  POC OCCULT BLOOD, ED - Abnormal; Notable for the following components:   Fecal Occult Bld POSITIVE (*)    All other components within normal limits  RESP PANEL BY RT-PCR (FLU A&B, COVID) ARPGX2  CBC  OCCULT BLOOD GASTRIC / DUODENUM (SPECIMEN CUP)  TYPE AND SCREEN  ABO/RH    EKG None  Radiology No results found.  Procedures Procedures (including critical care time)  Medications Ordered in ED Medications  sodium chloride 0.9 % bolus 1,000 mL (1,000 mLs Intravenous New Bag/Given 08/08/20 0944)  ondansetron (ZOFRAN) injection 4 mg (4 mg Intravenous Given 08/08/20 0946)  pantoprazole (PROTONIX) injection 40 mg (40 mg Intravenous Given 08/08/20 0949)  metoCLOPramide (REGLAN) injection 10 mg (10 mg Intravenous Given 08/08/20 1206)    ED Course  I have reviewed the triage vital signs and the nursing  notes.  Pertinent labs & imaging results that were available during my care of the patient were reviewed by me and considered in my medical decision making (see chart for details).  Patient seen and examined. Work-up initiated including gastroccult.  AKI noted.  Discussed with Dr. Gilford Raid who has seen patient.   Vital signs reviewed and are  as follows: BP (!) 189/98 (BP Location: Left Arm)   Pulse 100   Temp 97.7 F (36.5 C) (Oral)   Resp (!) 22   SpO2 96%   11:01 AM Pt has had more vomiting. Additional antiemetics ordered. Awaiting remainder of testing.   12:01 PM DRE performed with RN chaperone. Stool is not grossly bloody -- however is slightly heme positive. Assume UGI bleeding. Will admit.   BP (!) 155/96   Pulse 90   Temp 97.7 F (36.5 C) (Oral)   Resp 17   SpO2 91%   12:16 PM Discussed patient with IMTS who will see and admit. Sent request for GI consult to unassigned GI ([facility]/Dr. Paulita Fujita).   Pt updated on results.     MDM Rules/Calculators/A&P                          Admit.   Final Clinical Impression(s) / ED Diagnoses Final diagnoses:  Acute kidney injury ([place])  Solitary kidney, acquired  Upper GI bleeding  Non-intractable vomiting with nausea, unspecified vomiting type    Rx / DC Orders ED Discharge Orders    None       Carlisle Cater, Hershal Coria 08/08/20 1218    Isla Pence, MD 08/08/20 1302

## 2020-08-08 NOTE — H&P (Signed)
Date: 08/08/2020               Patient Name:  Joshua Lewis MRN: [id number]  DOB: 03/06/1961 Age / Sex: 59 y.o., male   PCP: Puschinsky, Fransico Him., MD         Medical Service: Internal Medicine Teaching Service         Attending Physician: Dr. Jimmye Norman, Elaina Pattee, MD    First Contact: Dr. Meredith Staggers Delberta Folts Pager: [phone]  Second Contact: Dr. Harvie Heck Pager: [phone]       After Hours (After 5p/  First Contact Pager: [phone]  weekends / holidays): Second Contact Pager: [phone]   Chief Complaint: Hiccups   History of Present Illness: Joshua Lewis is a 48 yo M w PMH of kidney carcinoma s/p R nephrectomy, GERD, Depression, Opiate dependence, degenerative disc disease ( MRI 03/11/2016-L3, L4, L5, S1, T2) presents to [facility] for intractable hiccups *4 days and dark colored vomiting *4 days.   Patient endorses severe hiccups for the past 3-4 days that have progressively worsened. He also endorses vomiting up "black phlegm" every 30-60 minutes for this same duration. He notes that this is watery in consistency. No prior history of this. He denies any cough, dyspnea, nausea. He endorses having good appetite and good oral intake. He endorses eating breakfast and lunch. He had a bowel movement yesterday that was loose and dark/black stools. Endorses abdominal bloating and tighness on the first day that has since resolved with multiple bowel movements.  Denies any fevers/chills, headache, throat pain, abdominal pain, chest pain, dysuria, generalized pain.   ED Course: BP (!) 189/98 (BP Location: Left Arm)   Pulse 100   Temp 97.7 F (36.5 C) (Oral)   Resp (!) 22   SpO2 96% . Had one episode of vomiting. Heme positive stool.   Meds:  Meds ordered this encounter  Medications  . sodium chloride 0.9 % bolus 1,000 mL  . ondansetron (ZOFRAN) injection 4 mg  . pantoprazole (PROTONIX) injection 40 mg  . metoCLOPramide (REGLAN) injection 10 mg  . sodium chloride flush (NS) 0.9 % injection 3 mL  . OR  Linked Order Group   . acetaminophen (TYLENOL) tablet 650 mg   . acetaminophen (TYLENOL) suppository 650 mg  . DISCONTD: senna-docusate (Senokot-S) tablet 2 tablet  . promethazine (PHENERGAN) tablet 12.5 mg  . FOLLOWED BY Linked Order Group   . lactated ringers bolus 1,000 mL   . lactated ringers infusion  . pantoprazole (PROTONIX) injection 40 mg  . DISCONTD: baclofen (LIORESAL) tablet 10 mg  . metoCLOPramide (REGLAN) injection 5 mg    Past Medical History:  Diagnosis Date  . Chronic back pain greater than 3 months duration   . Kidney carcinoma ([place])    No outpatient medications have been marked as taking for the 08/08/20 encounter [facility] Encounter).     Allergies: Allergies as of 08/08/2020  . (No Known Allergies)   Past Medical History:  Diagnosis Date  . Chronic back pain greater than 3 months duration   . Kidney carcinoma ([place])   R nephrectomy  Family History:  Family History  Problem Relation Age of Onset  . Cancer Mother   . Diabetes Mother   . Hypertension Mother   Mother - lung cancer, deceased in 2000/12/22  Social History:  Lives with wife and son. Not currently working Smokes 4 cigarettes per day for 8-10 years.  Endorses occasional alcohol use.  Denies other illicit drug use.   Review of Systems:  A complete ROS was negative except as per HPI.   Physical Exam: Blood pressure (!) 149/89, pulse 84, temperature 100 F (37.8 C), temperature source Oral, resp. rate 19, height 5\' 9"  (1.753 m), weight 79.4 kg, SpO2 99 %.   General: Well developed, middle age male lying uncomfortably in bed, in acute distress. HEENT: NCAT, MMM, EOMI Neck: Supple, ROM intact Cardiovascular: RRR, S1, S2 normal, No m/r/g Pulmonary: CTAB, no respiratory distress. Abdominal: Soft, no rigidity, no guarding, no tenderness on palpation, tympanic on ausculation. Genitourinary: Guaiac result positive (brown stool on glove, no pain).  Neuro: AAO*3, normal speech, obey commands, motor  strength 5/5 in all ,imbs, sensation intact. Skin: Warm, dry Psych: Anxious mood and affect, normal thought content, normal judgement.   CBC Latest Ref Rng & Units 08/08/2020 05/13/2020 01/27/2020  WBC 4.0 - 10.5 K/uL 9.3 9.9 14.4(H)  Hemoglobin 13.0 - 17.0 g/dL 14.7 11.9(L) 16.3  Hematocrit 39 - 52 % 46.2 38.3(L) 49.5  Platelets 150 - 400 K/uL 222 261 355   BMP Latest Ref Rng & Units 08/08/2020 05/13/2020 01/27/2020  Glucose 70 - 99 mg/dL 144(H) 105(H) 103(H)  BUN 6 - 20 mg/dL 24(H) 12 14  Creatinine 0.61 - 1.24 mg/dL 2.43(H) 1.11 1.18  Sodium 135 - 145 mmol/L 131(L) 138 137  Potassium 3.5 - 5.1 mmol/L 4.3 4.3 4.1  Chloride 98 - 111 mmol/L 93(L) 102 104  CO2 22 - 32 mmol/L 21(L) 25 21(L)  Calcium 8.9 - 10.3 mg/dL 8.9 9.4 9.5    EKG: Pending CXR: N/A X-ray Abdomen:  IMPRESSION: No bowel obstruction or free air.  Moderate stool in colon.  Assessment & Plan by Problem: Joshua Lewis is a 87 yo M w PMH of kidney carcinoma s/p R nephrectomy, GERD, Depression, Opiate dependence, degenerative disc disease ( MRI 03/11/2016-L3, L4, L5, S1, T2) presents to [facility] for intractable hiccups *4 days and dark colored vomiting *4 admitted for GI bleed.  Active Problems:   GI bleed   #Hiccups #Black emesis/ hematemesis # GI Bleed # GERD Patient presents with refractory hiccups and black emesis * 4 days. Has PMH of GERD. Denies abdominal pain. Takes NASIDS intermittently for his back pain. No similar episodes in past. Reports  loose and dark/black stools yesterday. X-ray abdomen does not show any obstruction or free air. Doesn't recall prior colonoscopy. Had endoscopy years ago, unclear reasons. GI Consulted and they plan to do endoscopy tomorrow.  - Appreciate GI assistance - Metoclopramide for Hiccups - Protonix 40 mg BID - IVF LR 1,000 ml bolus followed with maintenance 100 ml/hr.  - F/U Endoscopy tomorrow. - Phenergan 4 mg PRN  # AKI Presents with s Cr~ 2.43, BUN 24.  Possibly due to dehydration  and poor intake.  - IV fluids -F/U BMP AM  # Depression Had an inpatient psychiatric hospitalization ~5 months ago for depression, opiate dependence and suicidal ideations. He was discharged on Remeron 15 mg QHS, Zoloft 50 mg Qday, Melatonin 5 mg QHS.  - Hold Zoloft until tolerate PO -Hold Remeron until tolerate PO -Hold Melatonin 5 mg until tolerate PO   Dispo: Admit patient to Inpatient with expected length of stay greater than 2 midnights.  Signed: Honor Junes, MD 08/08/2020, 5:41 PM  Pager: [phone] After 5pm on weekdays and 1pm on weekends: On Call pager: [phone]

## 2020-08-08 NOTE — ED Notes (Signed)
Date and time results received: 08/08/20 1152 (use smartphrase ".now" to insert current time)  Test: occult blood Critical Value: positive Name of Provider Notified: Rondel Oh, PA-C  Orders Received? Or Actions Taken?: GI consult

## 2020-08-08 NOTE — ED Triage Notes (Signed)
Pt reports hiccups x 3 days and black emesis.  Denies pain.

## 2020-08-08 NOTE — Consult Note (Signed)
[facility] Consultation Note  Referring Provider: Internal Medicine Teaching Service Primary Care Physician:  Puschinsky, Fransico Him., MD  Reason for Consultation:  hematemesis  HPI: Joshua Lewis is a 59 y.o. male history of kidney cancer and resection.  Presents with several day history of refractory hiccups as well as multiple episodes of black emesis.  No abdominal pain.  No appreciable GERD, but has had in past.  No blood in stool.  Doesn't recall prior colonoscopy.  Thinks he might have had endoscopy many years ago, unclear reasons.   Takes NSAIDs intermittently.   Past Medical History:  Diagnosis Date  . Chronic back pain greater than 3 months duration   . Kidney carcinoma [facility])     Past Surgical History:  Procedure Laterality Date  . HAND SURGERY    . HERNIA REPAIR    . NEPHRECTOMY     right    Prior to Admission medications   Medication Sig Start Date End Date Taking? Authorizing Provider  amoxicillin-clavulanate (AUGMENTIN) 875-125 MG tablet Take 1 tablet by mouth 2 (two) times daily. One po bid x 7 days Patient not taking: Reported on 08/08/2020 05/13/20   Charlesetta Shanks, MD  famotidine (PEPCID) 20 MG tablet Take 1 tablet (20 mg total) by mouth 2 (two) times daily. Patient not taking: Reported on 08/08/2020 05/13/20   Charlesetta Shanks, MD  hydrOXYzine (ATARAX/VISTARIL) 25 MG tablet Take 1 tablet (25 mg total) by mouth every 6 (six) hours as needed for anxiety. Patient not taking: Reported on 08/08/2020 02/02/20   Money, [name] [name], FNP  melatonin 5 MG TABS Take 1 tablet (5 mg total) by mouth at bedtime. Patient not taking: Reported on 08/08/2020 02/02/20   Money, [name] [name], FNP  mirtazapine (REMERON) 15 MG tablet Take 1 tablet (15 mg total) by mouth at bedtime. Patient not taking: Reported on 08/08/2020 02/02/20   Money, [name] [name], FNP  naproxen (NAPROSYN) 375 MG tablet Take 1 tablet (375 mg total) by mouth 2 (two) times daily. Patient not taking: Reported on 08/08/2020  05/13/20   Charlesetta Shanks, MD  sertraline (ZOLOFT) 50 MG tablet Take 1 tablet (50 mg total) by mouth daily. Patient not taking: Reported on 08/08/2020 03/06/20   Kerin Perna, NP  Ibuprofen-Diphenhydramine HCl (ADVIL PM) 200-25 MG CAPS Take 1 tablet by mouth at bedtime. To help sleep  11/25/11  [provider]    Current Facility-Administered Medications  Medication Dose Route Frequency Provider Last Rate Last Admin  . acetaminophen (TYLENOL) tablet 650 mg  650 mg Oral Q6H PRN Harvie Heck, MD       Or  . acetaminophen (TYLENOL) suppository 650 mg  650 mg Rectal Q6H PRN Aslam, Sadia, MD      . baclofen (LIORESAL) tablet 10 mg  10 mg Oral TID Aslam, Sadia, MD      . lactated ringers bolus 1,000 mL  1,000 mL Intravenous Once Aslam, Loralyn Freshwater, MD       Followed by  . lactated ringers infusion   Intravenous Continuous Aslam, Sadia, MD      . metoCLOPramide (REGLAN) injection 5 mg  5 mg Intravenous Q8H Arta Silence, MD      . pantoprazole (PROTONIX) injection 40 mg  40 mg Intravenous Q12H Aslam, Sadia, MD      . promethazine (PHENERGAN) tablet 12.5 mg  12.5 mg Oral Q6H PRN Aslam, Sadia, MD      . sodium chloride flush (NS) 0.9 % injection 3 mL  3 mL Intravenous Q12H Aslam,  Loralyn Freshwater, MD       Current Outpatient Medications  Medication Sig Dispense Refill  . amoxicillin-clavulanate (AUGMENTIN) 875-125 MG tablet Take 1 tablet by mouth 2 (two) times daily. One po bid x 7 days (Patient not taking: Reported on 08/08/2020) 14 tablet 0  . famotidine (PEPCID) 20 MG tablet Take 1 tablet (20 mg total) by mouth 2 (two) times daily. (Patient not taking: Reported on 08/08/2020) 30 tablet 0  . hydrOXYzine (ATARAX/VISTARIL) 25 MG tablet Take 1 tablet (25 mg total) by mouth every 6 (six) hours as needed for anxiety. (Patient not taking: Reported on 08/08/2020) 30 tablet 0  . melatonin 5 MG TABS Take 1 tablet (5 mg total) by mouth at bedtime. (Patient not taking: Reported on 08/08/2020) 30 tablet 0  .  mirtazapine (REMERON) 15 MG tablet Take 1 tablet (15 mg total) by mouth at bedtime. (Patient not taking: Reported on 08/08/2020) 30 tablet 0  . naproxen (NAPROSYN) 375 MG tablet Take 1 tablet (375 mg total) by mouth 2 (two) times daily. (Patient not taking: Reported on 08/08/2020) 20 tablet 0  . sertraline (ZOLOFT) 50 MG tablet Take 1 tablet (50 mg total) by mouth daily. (Patient not taking: Reported on 08/08/2020) 30 tablet 0    Allergies as of 08/08/2020  . (No Known Allergies)    Family History  Problem Relation Age of Onset  . Cancer Mother   . Diabetes Mother   . Hypertension Mother     Social History   Socioeconomic History  . Marital status: Married    Spouse name: Not on file  . Number of children: Not on file  . Years of education: Not on file  . Highest education level: Not on file  Occupational History  . Not on file  Tobacco Use  . Smoking status: Current Some Day Smoker    Packs/day: 0.50  . Smokeless tobacco: Never Used  Vaping Use  . Vaping Use: Never used  Substance and Sexual Activity  . Alcohol use: No  . Drug use: No  . Sexual activity: Not on file  Other Topics Concern  . Not on file  Social History Narrative  . Not on file   Social Determinants of Health   Financial Resource Strain:   . Difficulty of Paying Living Expenses: Not on file  Food Insecurity:   . Worried About Charity fundraiser in the Last Year: Not on file  . Ran Out of Food in the Last Year: Not on file  Transportation Needs:   . Lack of Transportation (Medical): Not on file  . Lack of Transportation (Non-Medical): Not on file  Physical Activity:   . Days of Exercise per Week: Not on file  . Minutes of Exercise per Session: Not on file  Stress:   . Feeling of Stress : Not on file  Social Connections:   . Frequency of Communication with Friends and Family: Not on file  . Frequency of Social Gatherings with Friends and Family: Not on file  . Attends Religious Services: Not  on file  . Active Member of Clubs or Organizations: Not on file  . Attends Archivist Meetings: Not on file  . Marital Status: Not on file  Intimate Partner Violence:   . Fear of Current or Ex-Partner: Not on file  . Emotionally Abused: Not on file  . Physically Abused: Not on file  . Sexually Abused: Not on file    Review of Systems: As per HPI, all others  negative  Physical Exam: Vital signs in last 24 hours: Temp:  [97.7 F (36.5 C)-99.4 F (37.4 C)] 99.4 F (37.4 C) (11/28 1526) Pulse Rate:  [83-107] 87 (11/28 1526) Resp:  [11-22] 17 (11/28 1526) BP: (155-192)/(94-110) 156/94 (11/28 1526) SpO2:  [91 %-97 %] 92 % (11/28 1526) Weight:  [79.4 kg] 79.4 kg (11/28 1207)   General:   Alert,  Well-developed, well-nourished, pleasant and cooperative in NAD, multiple episodes of hiccups while I'm in room talking to patient. Head:  Normocephalic and atraumatic. Eyes:  Sclera clear, no icterus.   Conjunctiva pink. Ears:  Normal auditory acuity. Nose:  No deformity, discharge,  or lesions. Mouth:  No deformity or lesions.  Oropharynx pink & moist. Neck:  Supple; no masses or thyromegaly. Abdomen:  Soft, nontender and nondistended. No masses, hepatosplenomegaly or hernias noted. Normal bowel sounds, without guarding, and without rebound.     Msk:  Symmetrical without gross deformities. Normal posture. Pulses:  Normal pulses noted. Extremities:  Without clubbing or edema. Neurologic:  Alert and  oriented x4;  grossly normal neurologically. Skin:  Intact without significant lesions or rashes. Psych:  Alert and cooperative. Normal mood and affect.   Lab Results: Recent Labs    08/08/20 0818  WBC 9.3  HGB 14.7  HCT 46.2  PLT 222   BMET Recent Labs    08/08/20 0818  NA 131*  K 4.3  CL 93*  CO2 21*  GLUCOSE 144*  BUN 24*  CREATININE 2.43*  CALCIUM 8.9   LFT Recent Labs    08/08/20 0818  PROT 7.2  ALBUMIN 4.0  AST 34  ALT 20  ALKPHOS 85  BILITOT 0.4    PT/INR No results for input(s): LABPROT, INR in the last 72 hours.  Studies/Results: DG Abd 1 View  Result Date: 08/08/2020 CLINICAL DATA:  Abdominal distension with hiccups EXAM: ABDOMEN - 1 VIEW COMPARISON:  None. FINDINGS: There is moderate stool in the colon. There is no bowel dilatation or air-fluid level to suggest bowel obstruction. No free air. Visualized lung bases are clear. IMPRESSION: No bowel obstruction or free air.  Moderate stool in colon. Electronically Signed   By: Lowella Grip III M.D.   On: 08/08/2020 13:42    Impression:  1.  Hiccups. 2.  Acute renal failure, suspected dehydration in nature. 3.  Recurrent black emesis. 4.  History of GERD.  Plan:  1.  PPI. 2.  Soft diet. 3.  Metoclopramide for hiccups. 4.  IVF, volume repletion. 5.  Endoscopy tomorrow. 6.  Risks (bleeding, infection, bowel perforation that could require surgery, sedation-related changes in cardiopulmonary systems), benefits (identification and possible treatment of source of symptoms, exclusion of certain causes of symptoms), and alternatives (watchful waiting, radiographic imaging studies, empiric medical treatment) of upper endoscopy (EGD) were explained to patient/family in detail and patient wishes to proceed.   LOS: 0 days   Florinda Taflinger M  08/08/2020, 3:45 PM  Cell [phone] If no answer or after 5 PM call [phone]

## 2020-08-08 NOTE — ED Notes (Signed)
Pt's oxygenation down to the 90s. Pt placed on 2L. Pt is now 97% on 2L

## 2020-08-09 ENCOUNTER — Inpatient Hospital Stay (HOSPITAL_COMMUNITY): Payer: 59 | Admitting: Anesthesiology

## 2020-08-09 ENCOUNTER — Encounter (HOSPITAL_COMMUNITY): Payer: Self-pay | Admitting: Internal Medicine

## 2020-08-09 ENCOUNTER — Encounter (HOSPITAL_COMMUNITY)
Admission: EM | Disposition: A | Discharge status: Home / Self Care | Payer: Self-pay | Source: Home / Self Care | Attending: Internal Medicine

## 2020-08-09 HISTORY — PX: ESOPHAGOGASTRODUODENOSCOPY (EGD) WITH PROPOFOL: SHX5813

## 2020-08-09 LAB — COMPREHENSIVE METABOLIC PANEL
ALT: 18 U/L (ref 0–44)
AST: 48 U/L — ABNORMAL HIGH (ref 15–41)
Albumin: 3.1 g/dL — ABNORMAL LOW (ref 3.5–5.0)
Alkaline Phosphatase: 67 U/L (ref 38–126)
Anion gap: 9 (ref 5–15)
BUN: 20 mg/dL (ref 6–20)
CO2: 24 mmol/L (ref 22–32)
Calcium: 8.2 mg/dL — ABNORMAL LOW (ref 8.9–10.3)
Chloride: 102 mmol/L (ref 98–111)
Creatinine, Ser: 1.47 mg/dL — ABNORMAL HIGH (ref 0.61–1.24)
GFR, Estimated: 55 mL/min — ABNORMAL LOW (ref >60)
Glucose, Bld: 85 mg/dL (ref 70–99)
Potassium: 3.7 mmol/L (ref 3.5–5.1)
Sodium: 135 mmol/L (ref 135–145)
Total Bilirubin: 0.6 mg/dL (ref 0.3–1.2)
Total Protein: 5.6 g/dL — ABNORMAL LOW (ref 6.5–8.1)

## 2020-08-09 LAB — PROTIME-INR
INR: 1.1 (ref 0.8–1.2)
Prothrombin Time: 13.6 seconds (ref 11.4–15.2)

## 2020-08-09 LAB — CBC
HCT: 35.5 % — ABNORMAL LOW (ref 39.0–52.0)
Hemoglobin: 11.7 g/dL — ABNORMAL LOW (ref 13.0–17.0)
MCH: 28.3 pg (ref 26.0–34.0)
MCHC: 33 g/dL (ref 30.0–36.0)
MCV: 86 fL (ref 80.0–100.0)
Platelets: 166 10*3/uL (ref 150–400)
RBC: 4.13 MIL/uL — ABNORMAL LOW (ref 4.22–5.81)
RDW: 13 % (ref 11.5–15.5)
WBC: 10.3 10*3/uL (ref 4.0–10.5)
nRBC: 0 % (ref 0.0–0.2)

## 2020-08-09 LAB — TSH: TSH: 0.91 u[IU]/mL (ref 0.350–4.500)

## 2020-08-09 LAB — HIV ANTIBODY (ROUTINE TESTING W REFLEX): HIV Screen 4th Generation wRfx: NONREACTIVE

## 2020-08-09 SURGERY — ESOPHAGOGASTRODUODENOSCOPY (EGD) WITH PROPOFOL
Anesthesia: General | Laterality: Left

## 2020-08-09 MED ORDER — LACTATED RINGERS IV SOLN: INTRAVENOUS | Status: DC

## 2020-08-09 MED ORDER — SODIUM CHLORIDE 0.9 % IV SOLN: INTRAVENOUS | Status: DC

## 2020-08-09 MED ORDER — PROPOFOL 500 MG/50ML IV EMUL
INTRAVENOUS | Status: DC | PRN
  Administered 2020-08-09: 150 ug/kg/min via INTRAVENOUS

## 2020-08-09 MED ORDER — PROPOFOL 10 MG/ML IV BOLUS
INTRAVENOUS | Status: DC | PRN
  Administered 2020-08-09 (×2): 20 mg via INTRAVENOUS

## 2020-08-09 MED ORDER — LIDOCAINE 2% (20 MG/ML) 5 ML SYRINGE
INTRAMUSCULAR | Status: DC | PRN
  Administered 2020-08-09: 60 mg via INTRAVENOUS

## 2020-08-09 SURGICAL SUPPLY — 14 items

## 2020-08-09 NOTE — Hospital Course (Addendum)
Hiccups #Black emesis/ hematemesis # GI Bleed # GERD Presented with refractory hiccups and black emesis * 4 days. Has PMH of GERD. Denied abdominal pain. Takes NASIDS intermittently for his back pain. No similar episodes in past. Reports  loose and dark/black stools yesterday. X-ray abdomen does not show any obstruction or free air. Doesn't recall prior colonoscopy. Had endoscopy years ago, unclear reasons.  Endoscopy shows Upper GI bleed from significant esophagitis, although no active bleeding.He received Metoclopramide and Baclofen for Hiccups, Protonix 40 mg BID. Received IVF LR 1,000 ml bolus followed with maintenance 100 ml/hr, Phenergan 4 mg PRN. Plan is to continue Baclofen for Hiccups, Protonix 40 mg BID for 1 month, then Q day and try Tylenol ( Avoid NSAIDS) for back pain.    # AKI Presented with s Cr~ 2.43, BUN 24.  Possibly due to dehydration and poor intake. After IVF sCr started trending down.

## 2020-08-09 NOTE — Progress Notes (Signed)
Notified that patient requested covid buster shot. Prior to administering the wanted to ensure that patient talks with provider. Notified providers, they will round shortly on the patient and let me know so that I can confirm with team to come and administer covid shot.

## 2020-08-09 NOTE — Anesthesia Procedure Notes (Signed)
Procedure Name: MAC Date/Time: 08/09/2020 2:50 PM Performed by: Janene Harvey, CRNA Pre-anesthesia Checklist: Patient identified, Emergency Drugs available, Suction available and Patient being monitored Patient Re-evaluated:Patient Re-evaluated prior to induction Oxygen Delivery Method: Nasal cannula Induction Type: IV induction Placement Confirmation: positive ETCO2 Dental Injury: Teeth and Oropharynx as per pre-operative assessment

## 2020-08-09 NOTE — Progress Notes (Signed)
Jacinto Reap 2:56 PM  Subjective: Patient without any further bleeding in his hospital computer chart reviewed and his case discussed with my partner Dr. Paulita Fujita and he did have an endoscopy years ago  Objective: Vital signs stable afebrile no acute distress exam please see preassessment evaluation BUN and creatinine hemoglobin all okay  Assessment: Upper GI bleed  Plan: Okay to proceed with endoscopy with anesthesia assistance  [facility]  office [phone] After 5PM or if no answer call [phone]

## 2020-08-09 NOTE — Op Note (Signed)
[facility] Patient Name: Joshua Lewis Procedure Date : 08/09/2020 MRN: [id number] Attending MD: Clarene Essex , MD Date of Birth: [birth year] CSN: [id number] Age: 59 Admit Type: Inpatient Procedure:                Upper GI endoscopy Indications:              Hematemesis Providers:                Clarene Essex, MD, Kary Kos RN, RN, Cletis Athens,                            Technician, Lesia Sago, Technician, Reather Laurence, CRNA Referring MD:              Medicines:                Propofol total dose 992 mg IV Complications:            No immediate complications. Estimated Blood Loss:     Estimated blood loss: none. Estimated blood loss:                            none. Procedure:                Pre-Anesthesia Assessment:                           - Prior to the procedure, a History and Physical                            was performed, and patient medications and                            allergies were reviewed. The patient's tolerance of                            previous anesthesia was also reviewed. The risks                            and benefits of the procedure and the sedation                            options and risks were discussed with the patient.                            All questions were answered, and informed consent                            was obtained. Prior Anticoagulants: The patient has                            taken no previous anticoagulant or antiplatelet                            agents. ASA Grade  Assessment: II - A patient with                            mild systemic disease. After reviewing the risks                            and benefits, the patient was deemed in                            satisfactory condition to undergo the procedure.                           After obtaining informed consent, the endoscope was                            passed under direct vision. Throughout the                             procedure, the patient's blood pressure, pulse, and                            oxygen saturations were monitored continuously. The                            GIF-H190 ([id number]) Olympus gastroscope was                            introduced through the mouth, and advanced to the                            second part of duodenum. The upper GI endoscopy was                            accomplished without difficulty. The patient                            tolerated the procedure well. Scope In: Scope Out: Findings:      The larynx was normal.      A small hiatal hernia was present.      Few linear and superficial esophageal ulcers with no stigmata of recent       bleeding were found.      Moderately severe esophagitis with no bleeding was found.      The entire examined stomach was normal.      The second portion of the duodenum was normal.      The exam was otherwise without abnormality.      A few localized erosions without bleeding were found in the duodenal       bulb. Impression:               - Normal larynx.                           - Small hiatal hernia.                           -  Esophageal ulcers with no stigmata of recent                            bleeding.                           - Moderately severe reflux esophagitis with no                            bleeding.                           - Normal stomach.                           - Normal second portion of the duodenum.                           - The examination was otherwise normal.                           - Duodenal erosions without bleeding.                           - No specimens collected. Recommendation:           - Clear liquid diet today.                           - Continue present medications. Twice daily pump                            inhibitors for 1 month then decrease to once a day                           - Return to GI clinic PRN. Or in 1 month to set up                             endoscopy below                           - Telephone GI clinic if symptomatic PRN.                           - Repeat upper endoscopy in 2 -70months to evaluate                            the response to therapy. Procedure Code(s):        --- Professional ---                           [phone], Esophagogastroduodenoscopy, flexible,                            transoral; diagnostic, including collection of  specimen(s) by brushing or washing, when performed                            (separate procedure) Diagnosis Code(s):        --- Professional ---                           K44.9, Diaphragmatic hernia without obstruction or                            gangrene                           K22.10, Ulcer of esophagus without bleeding                           K21.00, Gastro-esophageal reflux disease with                            esophagitis, without bleeding                           K92.0, Hematemesis CPT copyright 2019 [employer] Association. All rights reserved. The codes documented in this report are preliminary and upon coder review may  be revised to meet current compliance requirements. Clarene Essex, MD 08/09/2020 3:21:00 PM This report has been signed electronically. Number of Addenda: 0

## 2020-08-09 NOTE — Transfer of Care (Signed)
Immediate Anesthesia Transfer of Care Note  Patient: Leandro Berkowitz  Procedure(s) Performed: ESOPHAGOGASTRODUODENOSCOPY (EGD) WITH PROPOFOL (Left )  Patient Location: Endoscopy Unit  Anesthesia Type:MAC  Level of Consciousness: drowsy and patient cooperative  Airway & Oxygen Therapy: Patient Spontanous Breathing and Patient connected to nasal cannula oxygen  Post-op Assessment: Report given to RN and Post -op Vital signs reviewed and stable  Post vital signs: Reviewed  Last Vitals:  Vitals Value Taken Time  BP 118/59 08/09/20 1509  Temp    Pulse 60 08/09/20 1509  Resp 12 08/09/20 1509  SpO2 100 % 08/09/20 1509    Last Pain:  Vitals:   08/09/20 1400  TempSrc: Oral  PainSc: 0-No pain         Complications: No complications documented.

## 2020-08-09 NOTE — Progress Notes (Signed)
6 beats of VT, asymptomatic, VS stable, notified provider.

## 2020-08-09 NOTE — Anesthesia Preprocedure Evaluation (Addendum)
Anesthesia Evaluation  Patient identified by MRN, date of birth, ID band Patient awake    Reviewed: Allergy & Precautions, H&P , NPO status , Patient's Chart, lab work & pertinent test results, reviewed documented beta blocker date and time   Airway Mallampati: I  TM Distance: >3 FB Neck ROM: full    Dental no notable dental hx.    Pulmonary neg pulmonary ROS, Current Smoker,    Pulmonary exam normal breath sounds clear to auscultation       Cardiovascular Exercise Tolerance: Good negative cardio ROS   Rhythm:regular Rate:Normal     Neuro/Psych PSYCHIATRIC DISORDERS Depression Had an inpatient psychiatric hospitalization ~5 months ago for depression, opiate dependence and suicidal ideations.negative neurological ROS     GI/Hepatic negative GI ROS, (+)     substance abuse  ,   Endo/Other  negative endocrine ROS  Renal/GU Renal diseasekidney carcinoma s/p R nephrectomy  negative genitourinary   Musculoskeletal  (+) narcotic dependent  Abdominal   Peds  Hematology negative hematology ROS (+)   Anesthesia Other Findings   Reproductive/Obstetrics negative OB ROS                            Anesthesia Physical Anesthesia Plan  ASA: III  Anesthesia Plan: General   Post-op Pain Management:    Induction: Intravenous  PONV Risk Score and Plan: 2  Airway Management Planned: Mask and Natural Airway  Additional Equipment:   Intra-op Plan:   Post-operative Plan:   Informed Consent: I have reviewed the patients History and Physical, chart, labs and discussed the procedure including the risks, benefits and alternatives for the proposed anesthesia with the patient or authorized representative who has indicated his/her understanding and acceptance.     Dental Advisory Given  Plan Discussed with: CRNA and Anesthesiologist  Anesthesia Plan Comments:         Anesthesia Quick  Evaluation

## 2020-08-09 NOTE — Plan of Care (Signed)
  Problem: Clinical Measurements: Goal: Will remain free from infection Outcome: Progressing   Problem: Clinical Measurements: Goal: Will remain free from infection Outcome: Progressing   Problem: Health Behavior/Discharge Planning: Goal: Ability to manage health-related needs will improve Outcome: Progressing   Problem: Education: Goal: Knowledge of General Education information will improve Description: Including pain rating scale, medication(s)/side effects and non-pharmacologic comfort measures Outcome: Progressing

## 2020-08-09 NOTE — Progress Notes (Signed)
   Subjective: HD#1 No acute overnight events.  Patient mentions he is feeling well this morning. Reports hiccups have ceased. He describes black vomiting that started a few hours after Thanksgiving. Last episode of emesis prior to arrival. He does mention he has taken a lot of NSAID's recently.  Discussed plan today for EGD with GI. Also discussed plan for careful pain control, especially given GI bleed and hx of nephrectomy.  Of note, patient mentions he previously stopped taking his Zoloft because he was worried about damaging his only kidney.  Objective:  Vital signs in last 24 hours: Vitals:   08/09/20 0044 08/09/20 0428 08/09/20 0823 08/09/20 1137  BP: (!) 99/59 112/62 125/79 123/71  Pulse: 73 61 64 68  Resp: 17 16 18 18   Temp: 98.4 F (36.9 C) 98 F (36.7 C) 98.2 F (36.8 C) 98.2 F (36.8 C)  TempSrc: Oral Oral Oral Oral  SpO2: 100% 96% 100% 98%  Weight: 78.8 kg     Height:       CBC Latest Ref Rng & Units 08/09/2020 08/08/2020 05/13/2020  WBC 4.0 - 10.5 K/uL 10.3 9.3 9.9  Hemoglobin 13.0 - 17.0 g/dL 11.7(L) 14.7 11.9(L)  Hematocrit 39 - 52 % 35.5(L) 46.2 38.3(L)  Platelets 150 - 400 K/uL 166 222 261   BMP Latest Ref Rng & Units 08/09/2020 08/08/2020 05/13/2020  Glucose 70 - 99 mg/dL 85 144(H) 105(H)  BUN 6 - 20 mg/dL 20 24(H) 12  Creatinine 0.61 - 1.24 mg/dL 1.47(H) 2.43(H) 1.11  Sodium 135 - 145 mmol/L 135 131(L) 138  Potassium 3.5 - 5.1 mmol/L 3.7 4.3 4.3  Chloride 98 - 111 mmol/L 102 93(L) 102  CO2 22 - 32 mmol/L 24 21(L) 25  Calcium 8.9 - 10.3 mg/dL 8.2(L) 8.9 9.4   Physical exam General: Well developed, middle age male lying comfortably in bed, NAD HEENT: NCAT, MMM, EOMI Neck: Supple, ROM intact Abdominal: Soft, no rigidity, no guarding, no tenderness on palpation, tympanic on auscultation.  Neuro: AAO*3 Skin: Warm, dry Psych: Pleasant mood and normal affect, normal judgement, normal thought content.   Assessment/Plan:  Joshua Lewis is a 59 yo M w PMH of  kidney carcinoma s/p R nephrectomy, GERD, Depression, Opiate dependence, degenerative disc disease ( MRI 03/11/2016-L3, L4, L5, S1, T2) presents to [facility] for intractable hiccups *4 days and dark colored vomiting *4 admitted for GI bleed.  Active Problems:   GI bleed  #Hiccups #Black emesis/ hematemesis # GI Bleed # GERD Patient's hiccups has stopped and feels better this morning. Received Metoclopramide. Denies nausea, vomiting, abdominal pain this morning. GI was consulted and they plan to do endoscopy this afternoon to rule out Upper GI bleed. - Appreciate GI assistance - Metoclopramide for Hiccups - Protonix 40 mg BID -  maintenance 100 ml/hr.  - F/U Endoscopy  - Phenergan 4 mg PRN  # AKI Patients sCr~ 1.47, trending down from s Cr~ 2.43, BUN 20 from 24.  Possibly due to dehydration and poor intake.  - Maintenance IV Fluids. - F/U BMP AM  Prior to Admission Living Arrangement: Home Anticipated Discharge Location: Home Barriers to Discharge: Ongoing medical management Dispo: Anticipated discharge in approximately 2-3 day(s).   Honor Junes, MD 08/09/2020, 11:45 AM Pager: [phone] After 5pm on weekdays and 1pm on weekends: On Call pager [phone]

## 2020-08-09 NOTE — Progress Notes (Addendum)
Informed by provider Dagar Meredith Staggers that patient is cleared to have covid buster shot after procedure today.Communicated this with covid inpatient team for vaccine who contacted me initialy.    Per team they will add the patient to tomorrows schedule.

## 2020-08-09 NOTE — Anesthesia Postprocedure Evaluation (Signed)
Anesthesia Post Note  Patient: Joshua Lewis  Procedure(s) Performed: ESOPHAGOGASTRODUODENOSCOPY (EGD) WITH PROPOFOL (Left )     Patient location during evaluation: PACU Anesthesia Type: General Level of consciousness: awake and alert Pain management: pain level controlled Vital Signs Assessment: post-procedure vital signs reviewed and stable Respiratory status: spontaneous breathing, nonlabored ventilation, respiratory function stable and patient connected to nasal cannula oxygen Cardiovascular status: blood pressure returned to baseline and stable Postop Assessment: no apparent nausea or vomiting Anesthetic complications: no   No complications documented.  Last Vitals:  Vitals:   08/09/20 1519 08/09/20 1546  BP: (!) 141/92 (!) 160/79  Pulse: 60 (!) 59  Resp: 15 18  Temp:  36.5 C  SpO2: 100% 98%    Last Pain:  Vitals:   08/09/20 1546  TempSrc: Oral  PainSc:                  Chasidy Janak

## 2020-08-10 ENCOUNTER — Encounter (HOSPITAL_COMMUNITY): Payer: Self-pay | Admitting: Internal Medicine

## 2020-08-10 ENCOUNTER — Other Ambulatory Visit (HOSPITAL_COMMUNITY): Payer: Self-pay | Admitting: Student in an Organized Health Care Education/Training Program

## 2020-08-10 DIAGNOSIS — N179 Acute kidney failure, unspecified: Secondary | ICD-10-CM | POA: Diagnosis not present

## 2020-08-10 DIAGNOSIS — K922 Gastrointestinal hemorrhage, unspecified: Secondary | ICD-10-CM

## 2020-08-10 DIAGNOSIS — K219 Gastro-esophageal reflux disease without esophagitis: Secondary | ICD-10-CM

## 2020-08-10 DIAGNOSIS — K920 Hematemesis: Secondary | ICD-10-CM

## 2020-08-10 LAB — BASIC METABOLIC PANEL
Anion gap: 13 (ref 5–15)
BUN: 12 mg/dL (ref 6–20)
CO2: 23 mmol/L (ref 22–32)
Calcium: 8.6 mg/dL — ABNORMAL LOW (ref 8.9–10.3)
Chloride: 104 mmol/L (ref 98–111)
Creatinine, Ser: 1.12 mg/dL (ref 0.61–1.24)
GFR, Estimated: >60 mL/min (ref >60)
Glucose, Bld: 129 mg/dL — ABNORMAL HIGH (ref 70–99)
Potassium: 3.8 mmol/L (ref 3.5–5.1)
Sodium: 140 mmol/L (ref 135–145)

## 2020-08-10 LAB — CBC
HCT: 35.2 % — ABNORMAL LOW (ref 39.0–52.0)
Hemoglobin: 11.4 g/dL — ABNORMAL LOW (ref 13.0–17.0)
MCH: 28.1 pg (ref 26.0–34.0)
MCHC: 32.4 g/dL (ref 30.0–36.0)
MCV: 86.7 fL (ref 80.0–100.0)
Platelets: 184 10*3/uL (ref 150–400)
RBC: 4.06 MIL/uL — ABNORMAL LOW (ref 4.22–5.81)
RDW: 13.3 % (ref 11.5–15.5)
WBC: 7.9 10*3/uL (ref 4.0–10.5)
nRBC: 0 % (ref 0.0–0.2)

## 2020-08-10 MED ORDER — PANTOPRAZOLE SODIUM 40 MG PO TBEC
40.0000 mg | DELAYED_RELEASE_TABLET | Freq: Two times a day (BID) | ORAL | Status: DC
  Administered 2020-08-10: 40 mg via ORAL
  Filled 2020-08-10: qty 1

## 2020-08-10 MED ORDER — ACETAMINOPHEN 325 MG PO TABS: 650.0000 mg | ORAL_TABLET | Freq: Four times a day (QID) | ORAL | 0 refills | Status: DC | PRN

## 2020-08-10 MED ORDER — PANTOPRAZOLE SODIUM 40 MG PO TBEC
40.0000 mg | DELAYED_RELEASE_TABLET | Freq: Two times a day (BID) | ORAL | 0 refills | Status: DC
Start: 2020-08-10 — End: 2020-08-10

## 2020-08-10 MED ORDER — BACLOFEN 10 MG PO TABS: 10.0000 mg | ORAL_TABLET | Freq: Three times a day (TID) | ORAL | 0 refills | Status: DC

## 2020-08-10 MED FILL — ACETAMINOPHEN 325 MG TABS: 325 | 3 days supply | Qty: 30 | Fill #0

## 2020-08-10 MED FILL — PANTOPRAZOLE SOD DR 40 MG T: 40 | 30 days supply | Qty: 60 | Fill #0

## 2020-08-10 MED FILL — BACLOFEN 10 MG TABS: 10 | 30 days supply | Qty: 90 | Fill #0

## 2020-08-10 NOTE — Plan of Care (Signed)
  Problem: Education: Goal: Knowledge of General Education information will improve Description Including pain rating scale, medication(s)/side effects and non-pharmacologic comfort measures Outcome: Adequate for Discharge   Problem: Health Behavior/Discharge Planning: Goal: Ability to manage health-related needs will improve Outcome: Adequate for Discharge   

## 2020-08-10 NOTE — TOC Transition Note (Signed)
Transition of Care [facility]) - CM/SW Discharge Note   Patient Details  Name: Joshua Lewis MRN: [id number] Date of Birth: 1961-04-23  Transition of Care [facility]) CM/SW Contact:  Zenon Mayo, RN Phone Number: 08/10/2020, 12:47 PM   Clinical Narrative:    NCM notified by Mikle Bosworth Staff RN patient will need transportation ast, NCM will ast with [employer].   Final next level of care: Home/Self Care Barriers to Discharge: No Barriers Identified   Patient Goals and CMS Choice        Discharge Placement                       Discharge Plan and Services                                     Social Determinants of Health (SDOH) Interventions     Readmission Risk Interventions No flowsheet data found.

## 2020-08-10 NOTE — Progress Notes (Signed)
Joshua Lewis 9:51 AM  Subjective: Patient without any further bleeding and no problems from his endoscopy and we reviewed the findings and he has no new complaints and is excited to go home  Objective: Vital signs stable afebrile no acute distress abdomen is soft nontender labs stable  Assessment: Upper GI bleed from significant esophagitis  Plan: Continue pump inhibitors long-term he plans to get those at the [place] and we are happy to see back as needed  [facility]  office [phone] After 5PM or if no answer call [phone]

## 2020-08-10 NOTE — Progress Notes (Signed)
   Subjective: HD#2 Patient evaluated at bedside this AM. Reports he feels well this morning, just finished his breakfast. Denies abdominal pain, hiccups. Discussed results of yesterday's endoscopy. Patient had brown BM earlier this AM. Will give recommendations for pain control in discharge paperwork. Discouraged use of NSAIDs given GI bleed and hx nephrectomy. Patient has previously schedule appointment with new PCP at the [place] in two weeks.  Objective:  Vital signs in last 24 hours: Vitals:   08/10/20 0546 08/10/20 0551 08/10/20 0920 08/10/20 1008  BP: 138/75  (!) 142/77 (!) 144/83  Pulse: 63 66 60 61  Resp: 18  16 18   Temp: 98 F (36.7 C)  98.3 F (36.8 C) 98.6 F (37 C)  TempSrc: Oral  Oral Oral  SpO2:  99% 100% 99%  Weight:      Height:       CBC Latest Ref Rng & Units 08/10/2020 08/09/2020 08/08/2020  WBC 4.0 - 10.5 K/uL 7.9 10.3 9.3  Hemoglobin 13.0 - 17.0 g/dL 11.4(L) 11.7(L) 14.7  Hematocrit 39 - 52 % 35.2(L) 35.5(L) 46.2  Platelets 150 - 400 K/uL 184 166 222   BMP Latest Ref Rng & Units 08/10/2020 08/09/2020 08/08/2020  Glucose 70 - 99 mg/dL 129(H) 85 144(H)  BUN 6 - 20 mg/dL 12 20 24(H)  Creatinine 0.61 - 1.24 mg/dL 1.12 1.47(H) 2.43(H)  Sodium 135 - 145 mmol/L 140 135 131(L)  Potassium 3.5 - 5.1 mmol/L 3.8 3.7 4.3  Chloride 98 - 111 mmol/L 104 102 93(L)  CO2 22 - 32 mmol/L 23 24 21(L)  Calcium 8.9 - 10.3 mg/dL 8.6(L) 8.2(L) 8.9   Physical exam General: Well developed, middle age male lying comfortably in bed, NAD HEENT: NCAT, MMM, EOMI Neck: Supple, ROM intact Neuro: AAO*3 Psych: Pleasant mood and normal affect, normal judgement, normal thought content.  Assessment/Plan: Joshua Lewis a 59 yo M w PMH of kidney carcinoma s/p R nephrectomy, GERD, Depression, Opiate dependence, degenerative disc disease ( MRI 03/11/2016-L3, L4, L5, S1, T2) presents to [facility] for intractable hiccups *4 days and dark colored vomiting *4 admitted for GI bleed.  Active Problems:   GI  bleed   #Hiccups- Improved #Black emesis/ hematemesis- Improved # GI Bleed # GERD Patient's hiccups has improved. No GI bleed this morning: No vomiting and brown colored stools today. Denies nausea, abdominal pain. Had endoscopy yesterday and it suggests Upper GI Bleed from significant esophagitis and plan is to continue PPI's BID for 1 month and then 1 Q day.   - Greatly appreciate GI assistance. - Protonix PO 40 mg BID - Phenergan 4 mg PRN  # AKI- improved His kidney functions are improving. SCr~ 1.12 this morning from 1.47 BUN 12 from 20. Possibly due to dehydration and poor intake.   Prior to Admission Living Arrangement: Home Anticipated Discharge Location: Home Barriers to Discharge: None Dispo: Anticipated discharge in approximately 0 day(s).   Honor Junes, MD 08/10/2020, 11:30 AM Pager: [phone] After 5pm on weekdays and 1pm on weekends: On Call pager [phone]

## 2020-08-13 NOTE — Discharge Summary (Signed)
Name: Joshua Lewis MRN: [id number] DOB: 1961/05/08 59 y.o. PCP: Puschinsky, Fransico Him., MD  Date of Admission: 08/08/2020  8:02 AM Date of Discharge: 08/10/2020 Attending Physician: Dr. Jimmye Norman Discharge Diagnosis: 1. Hiccups 2. Hematemesis 3. AKI    Discharge Medications: Allergies as of 08/10/2020   No Known Allergies     Medication List    STOP taking these medications   amoxicillin-clavulanate 875-125 MG tablet Commonly known as: Augmentin   famotidine 20 MG tablet Commonly known as: PEPCID   hydrOXYzine 25 MG tablet Commonly known as: ATARAX/VISTARIL   melatonin 5 MG Tabs   mirtazapine 15 MG tablet Commonly known as: REMERON   naproxen 375 MG tablet Commonly known as: NAPROSYN   sertraline 50 MG tablet Commonly known as: ZOLOFT     TAKE these medications   acetaminophen 325 MG tablet Commonly known as: TYLENOL Take 2 tablets (650 mg total) by mouth every 6 (six) hours as needed for mild pain (or Fever >/= 101).   baclofen 10 MG tablet Commonly known as: LIORESAL Take 1 tablet (10 mg total) by mouth 3 (three) times daily.   pantoprazole 40 MG tablet Commonly known as: PROTONIX Take 1 tablet (40 mg total) by mouth 2 (two) times daily.       Disposition and follow-up:   Joshua Lewis was discharged from [facility] in Stable condition.  At the hospital follow up visit please address:  - Hiccups : Resolved but continue Baclofen for Hiccups - Hematemesis: resolved but continue Protonix 40 mg BID for 1 month, then Q day and try Tylenol ( Avoid NSAIDS) for back pain.   - AKI - Improved, no further follow up at this point.   2.  Labs / imaging needed at time of follow-up: None  3.  Pending labs/ test needing follow-up: None  Follow-up Appointments:  Follow-up Information    Call Clarene Essex, MD.   Specialty: Gastroenterology Why: Call our office if any questions or concerns Contact information: [address] [place] [address] [phone]        Puschinsky, Fransico Him., MD. Daphane Shepherd on 09/02/2020.   Specialty: General Surgery Why: @9 :30am Contact information: [place] [address] [phone]               Hospital Course by problem list: Hiccups #Black emesis/ hematemesis # GI Bleed # GERD Presented with refractory hiccups and black emesis * 4 days. Has PMH of GERD. Denied abdominal pain. Takes NASIDS intermittently for his back pain. No similar episodes in past. Reports  loose and dark/black stools yesterday. X-ray abdomen does not show any obstruction or free air. Doesn't recall prior colonoscopy. Had endoscopy years ago, unclear reasons.  Endoscopy shows Upper GI bleed from significant esophagitis, although no active bleeding.He received Metoclopramide and Baclofen for Hiccups, Protonix 40 mg BID. Received IVF LR 1,000 ml bolus followed with maintenance 100 ml/hr, Phenergan 4 mg PRN. Plan is to continue Baclofen for Hiccups, Protonix 40 mg BID for 1 month, then Q day and try Tylenol ( Avoid NSAIDS) for back pain.    # AKI Presented with s Cr~ 2.43, BUN 24.  Possibly due to dehydration and poor intake. After IVF sCr started trending down.      Discharge Vitals:   BP (!) 144/83 (BP Location: Right Arm)   Pulse 61   Temp 98.6 F (37 C) (Oral)   Resp 18   Ht 5\' 9"  (1.753 m)  Wt 77.7 kg   SpO2 99%   BMI 25.30 kg/m   Pertinent Labs, Studies, and Procedures:  CBC Latest Ref Rng & Units 08/10/2020 08/09/2020 08/08/2020  WBC 4.0 - 10.5 K/uL 7.9 10.3 9.3  Hemoglobin 13.0 - 17.0 g/dL 11.4(L) 11.7(L) 14.7  Hematocrit 39 - 52 % 35.2(L) 35.5(L) 46.2  Platelets 150 - 400 K/uL 184 166 222   BMP Latest Ref Rng & Units 08/10/2020 08/09/2020 08/08/2020  Glucose 70 - 99 mg/dL 129(H) 85 144(H)  BUN 6 - 20 mg/dL 12 20 24(H)  Creatinine 0.61 - 1.24 mg/dL 1.12 1.47(H) 2.43(H)  Sodium 135 - 145 mmol/L 140 135 131(L)  Potassium 3.5 - 5.1 mmol/L 3.8 3.7 4.3   Chloride 98 - 111 mmol/L 104 102 93(L)  CO2 22 - 32 mmol/L 23 24 21(L)  Calcium 8.9 - 10.3 mg/dL 8.6(L) 8.2(L) 8.9     Discharge Instructions: Discharge Instructions    Call MD for:  persistant dizziness or light-headedness   Complete by: As directed    Call MD for:  persistant nausea and vomiting   Complete by: As directed    Diet - low sodium heart healthy   Complete by: As directed    Discharge instructions   Complete by: As directed    Mr. Joshua Lewis: You presented with hiccups from last 4 days and black vomiting * 4 days. You got Metoclopramide for Hiccups and it relieved them. For your black vomiting  you had an endoscopy done and it suggests bleeding from esophagitis ( inflammation of food pipe). You should take Protonix 2 times daily for 1 month and then 1 time daily. Please go for your PCP appointment in [place] on Dec 15th.  Also, possible cause of your esophagitis ( inflammation of food pipe) is pain reliever's you use for your back pain. I am thinking you can try Tylenol  and adjust medications when go to see your PCP.   I am happy you feel better! Take care!  Also, possible cause of your esophagitis ( inflammation of food pipe) is pain reliever's you use for your back pain. I am thinking you can try Tylenol and Baclofen ( muscle relaxer) and adjust medications when go to see your PCP.   I am happy you feel better! Take care!   Increase activity slowly   Complete by: As directed       Signed: Honor Junes, MD 08/13/2020, 6:22 PM   Pager: [phone]

## 2020-08-18 ENCOUNTER — Emergency Department (HOSPITAL_COMMUNITY)
Admission: EM | Admit: 2020-08-18 | Discharge: 2020-08-19 | Disposition: A | Discharge status: Home / Self Care | Payer: 59 | Attending: Emergency Medicine | Admitting: Emergency Medicine

## 2020-08-18 ENCOUNTER — Encounter (HOSPITAL_COMMUNITY): Payer: Self-pay | Admitting: *Deleted

## 2020-08-18 ENCOUNTER — Emergency Department (HOSPITAL_COMMUNITY): Payer: 59

## 2020-08-18 ENCOUNTER — Other Ambulatory Visit: Payer: Self-pay

## 2020-08-18 DIAGNOSIS — M25561 Pain in right knee: Secondary | ICD-10-CM | POA: Diagnosis not present

## 2020-08-18 DIAGNOSIS — Z85528 Personal history of other malignant neoplasm of kidney: Secondary | ICD-10-CM | POA: Diagnosis not present

## 2020-08-18 DIAGNOSIS — F17200 Nicotine dependence, unspecified, uncomplicated: Secondary | ICD-10-CM | POA: Insufficient documentation

## 2020-08-18 DIAGNOSIS — M25569 Pain in unspecified knee: Secondary | ICD-10-CM | POA: Diagnosis present

## 2020-08-18 NOTE — ED Triage Notes (Signed)
Pt c/o R knee pain x 5 days unrelieved with OTC meds at home. Swelling noted to R knee, pt reports fluid buildup around knee

## 2020-08-19 ENCOUNTER — Emergency Department (HOSPITAL_COMMUNITY): Payer: 59

## 2020-08-19 ENCOUNTER — Other Ambulatory Visit: Payer: Self-pay

## 2020-08-19 LAB — CBC WITH DIFFERENTIAL/PLATELET
Abs Immature Granulocytes: 0.05 10*3/uL (ref 0.00–0.07)
Basophils Absolute: 0.1 10*3/uL (ref 0.0–0.1)
Basophils Relative: 1 %
Eosinophils Absolute: 0.1 10*3/uL (ref 0.0–0.5)
Eosinophils Relative: 1 %
HCT: 39.8 % (ref 39.0–52.0)
Hemoglobin: 12.5 g/dL — ABNORMAL LOW (ref 13.0–17.0)
Immature Granulocytes: 0 %
Lymphocytes Relative: 22 %
Lymphs Abs: 2.6 10*3/uL (ref 0.7–4.0)
MCH: 28.1 pg (ref 26.0–34.0)
MCHC: 31.4 g/dL (ref 30.0–36.0)
MCV: 89.4 fL (ref 80.0–100.0)
Monocytes Absolute: 0.9 10*3/uL (ref 0.1–1.0)
Monocytes Relative: 7 %
Neutro Abs: 8.3 10*3/uL — ABNORMAL HIGH (ref 1.7–7.7)
Neutrophils Relative %: 69 %
Platelets: 404 10*3/uL — ABNORMAL HIGH (ref 150–400)
RBC: 4.45 MIL/uL (ref 4.22–5.81)
RDW: 13.8 % (ref 11.5–15.5)
WBC: 12.1 10*3/uL — ABNORMAL HIGH (ref 4.0–10.5)
nRBC: 0 % (ref 0.0–0.2)

## 2020-08-19 LAB — SYNOVIAL CELL COUNT + DIFF, W/ CRYSTALS
Crystals, Fluid: NONE SEEN
Eosinophils-Synovial: 0 % (ref 0–1)
Lymphocytes-Synovial Fld: 0 % (ref 0–20)
Monocyte-Macrophage-Synovial Fluid: 6 % — ABNORMAL LOW (ref 50–90)
Neutrophil, Synovial: 94 % — ABNORMAL HIGH (ref 0–25)
WBC, Synovial: 17875 /mm3 — ABNORMAL HIGH (ref 0–200)

## 2020-08-19 LAB — COMPREHENSIVE METABOLIC PANEL
ALT: 21 U/L (ref 0–44)
AST: 16 U/L (ref 15–41)
Albumin: 3.2 g/dL — ABNORMAL LOW (ref 3.5–5.0)
Alkaline Phosphatase: 89 U/L (ref 38–126)
Anion gap: 12 (ref 5–15)
BUN: 14 mg/dL (ref 6–20)
CO2: 23 mmol/L (ref 22–32)
Calcium: 8.9 mg/dL (ref 8.9–10.3)
Chloride: 103 mmol/L (ref 98–111)
Creatinine, Ser: 1.17 mg/dL (ref 0.61–1.24)
GFR, Estimated: >60 mL/min (ref >60)
Glucose, Bld: 102 mg/dL — ABNORMAL HIGH (ref 70–99)
Potassium: 4 mmol/L (ref 3.5–5.1)
Sodium: 138 mmol/L (ref 135–145)
Total Bilirubin: 0.8 mg/dL (ref 0.3–1.2)
Total Protein: 6.6 g/dL (ref 6.5–8.1)

## 2020-08-19 LAB — C-REACTIVE PROTEIN: CRP: 3 mg/dL — ABNORMAL HIGH (ref <1.0)

## 2020-08-19 LAB — SEDIMENTATION RATE: Sed Rate: 33 mm/hr — ABNORMAL HIGH (ref 0–16)

## 2020-08-19 MED ORDER — BUPIVACAINE HCL (PF) 0.5 % IJ SOLN
10.0000 mL | Freq: Once | INTRAMUSCULAR | Status: AC
  Administered 2020-08-19: 10 mL
  Filled 2020-08-19: qty 10

## 2020-08-19 MED ORDER — MORPHINE SULFATE (PF) 4 MG/ML IV SOLN
4.0000 mg | Freq: Once | INTRAVENOUS | Status: AC
  Administered 2020-08-19: 4 mg via INTRAVENOUS
  Filled 2020-08-19: qty 1

## 2020-08-19 MED ORDER — METHYLPREDNISOLONE ACETATE 80 MG/ML IJ SUSP
80.0000 mg | Freq: Once | INTRAMUSCULAR | Status: AC
  Administered 2020-08-19: 80 mg via INTRA_ARTICULAR
  Filled 2020-08-19: qty 1

## 2020-08-19 MED ORDER — HYDROCODONE-ACETAMINOPHEN 5-325 MG PO TABS
1.0000 | ORAL_TABLET | Freq: Once | ORAL | Status: AC
  Administered 2020-08-19: 1 via ORAL
  Filled 2020-08-19: qty 1

## 2020-08-19 MED ORDER — HYDROMORPHONE HCL 1 MG/ML IJ SOLN
1.0000 mg | Freq: Once | INTRAMUSCULAR | Status: AC
  Administered 2020-08-19: 1 mg via INTRAVENOUS
  Filled 2020-08-19: qty 1

## 2020-08-19 MED ORDER — LIDOCAINE HCL (PF) 1 % IJ SOLN
5.0000 mL | Freq: Once | INTRAMUSCULAR | Status: AC
  Administered 2020-08-19: 5 mL
  Filled 2020-08-19: qty 5

## 2020-08-19 NOTE — ED Notes (Signed)
MD made aware that patient is requesting updated on plan of care.

## 2020-08-19 NOTE — ED Notes (Signed)
Consent signed by patient and this RN. Meds placed at bedside and PA Jefferey made aware patient is ready for procedure.

## 2020-08-19 NOTE — Procedures (Signed)
Procedure: Right knee aspiration and injection  Indication: Right knee effusion(s)  Surgeon: Silvestre Gunner, PA-C  Assist: None  Anesthesia: Topical refrigerant  EBL: None  Complications: None  Findings: After risks/benefits explained patient desires to undergo procedure. Consent obtained and time out performed. The right knee was sterilely prepped and aspirated. 4ml opaque pale yellow fluid obtained. 1ml 0.5% Marcaine and 80mg  depomedrol instilled. Pt tolerated the procedure well.    Lisette Abu, PA-C Orthopedic Surgery [phone]

## 2020-08-19 NOTE — Discharge Instructions (Addendum)
You were evaluated in the Emergency Department and after careful evaluation, we did not find any emergent condition requiring admission or further testing in the hospital.  Your exam/testing today was overall reassuring.  Please return to the Emergency Department if you experience any worsening of your condition.  Thank you for allowing us to be a part of your care.  

## 2020-08-19 NOTE — ED Notes (Signed)
Patient updated that orthopedist should be coming to see him soon.

## 2020-08-19 NOTE — ED Notes (Signed)
Patient requesting to eat. Dr. Sedonia Small advised pt may have food at this time, sandwich bag provided to patient.

## 2020-08-19 NOTE — Consult Note (Signed)
Reason for Consult:Right knee pain Referring Physician: Viona Gilmore Time called: 0730 Time at bedside: 1021   Joshua Lewis is an 59 y.o. male.  HPI: Tor comes in with a 5d hx/o right knee pain and swelling. He denies any known antecedent event. He denies fevers, chills, sweats, N/V, or hx/o gout. He thinks he may have had something similar years ago but can't really remember. He underwent a dry tap by EDP earlier. MRI showed mod effusion, some synovitis, and a meniscal tear and orthopedic surgery was consulted.  Past Medical History:  Diagnosis Date  . Chronic back pain greater than 3 months duration   . Kidney carcinoma [facility])     Past Surgical History:  Procedure Laterality Date  . ESOPHAGOGASTRODUODENOSCOPY (EGD) WITH PROPOFOL Left 08/09/2020   Procedure: ESOPHAGOGASTRODUODENOSCOPY (EGD) WITH PROPOFOL;  Surgeon: Clarene Essex, MD;  Location: [facility];  Service: Endoscopy;  Laterality: Left;  . HAND SURGERY    . HERNIA REPAIR    . NEPHRECTOMY     right    Family History  Problem Relation Age of Onset  . Cancer Mother   . Diabetes Mother   . Hypertension Mother     Social History:  reports that he has been smoking. He has been smoking about 0.50 packs per day. He has never used smokeless tobacco. He reports that he does not drink alcohol and does not use drugs.  Allergies: No Known Allergies  Medications: I have reviewed the patient's current medications.  Results for orders placed or performed during the hospital encounter of 08/18/20 (from the past 48 hour(s))  CBC with Differential     Status: Abnormal   Collection Time: 08/19/20  4:02 AM  Result Value Ref Range   WBC 12.1 (H) 4.0 - 10.5 K/uL   RBC 4.45 4.22 - 5.81 MIL/uL   Hemoglobin 12.5 (L) 13.0 - 17.0 g/dL   HCT 39.8 39.0 - 52.0 %   MCV 89.4 80.0 - 100.0 fL   MCH 28.1 26.0 - 34.0 pg   MCHC 31.4 30.0 - 36.0 g/dL   RDW 13.8 11.5 - 15.5 %   Platelets 404 (H) 150 - 400 K/uL   nRBC 0.0 0.0 - 0.2 %   Neutrophils  Relative % 69 %   Neutro Abs 8.3 (H) 1.7 - 7.7 K/uL   Lymphocytes Relative 22 %   Lymphs Abs 2.6 0.7 - 4.0 K/uL   Monocytes Relative 7 %   Monocytes Absolute 0.9 0.1 - 1.0 K/uL   Eosinophils Relative 1 %   Eosinophils Absolute 0.1 0.0 - 0.5 K/uL   Basophils Relative 1 %   Basophils Absolute 0.1 0.0 - 0.1 K/uL   Immature Granulocytes 0 %   Abs Immature Granulocytes 0.05 0.00 - 0.07 K/uL    Comment: Performed at [facility], [address] [place], [place] [address]  Comprehensive metabolic panel     Status: Abnormal   Collection Time: 08/19/20  4:02 AM  Result Value Ref Range   Sodium 138 135 - 145 mmol/L   Potassium 4.0 3.5 - 5.1 mmol/L   Chloride 103 98 - 111 mmol/L   CO2 23 22 - 32 mmol/L   Glucose, Bld 102 (H) 70 - 99 mg/dL    Comment: Glucose reference range applies only to samples taken after fasting for at least 8 hours.   BUN 14 6 - 20 mg/dL   Creatinine, Ser 1.17 0.61 - 1.24 mg/dL   Calcium 8.9 8.9 - 10.3 mg/dL  Total Protein 6.6 6.5 - 8.1 g/dL   Albumin 3.2 (L) 3.5 - 5.0 g/dL   AST 16 15 - 41 U/L   ALT 21 0 - 44 U/L   Alkaline Phosphatase 89 38 - 126 U/L   Total Bilirubin 0.8 0.3 - 1.2 mg/dL   GFR, Estimated >60 >60 mL/min    Comment: (NOTE) Calculated using the CKD-EPI Creatinine Equation (2021)    Anion gap 12 5 - 15    Comment: Performed at [facility] [address] [place], [place] [address]  Sedimentation rate     Status: Abnormal   Collection Time: 08/19/20  4:02 AM  Result Value Ref Range   Sed Rate 33 (H) 0 - 16 mm/hr    Comment: Performed at [facility] [address], [place], [place] [address]  C-reactive protein     Status: Abnormal   Collection Time: 08/19/20  4:02 AM  Result Value Ref Range   CRP 3.0 (H) <1.0 mg/dL    Comment: Performed at [facility] [address] [place], [place] [address]    MR KNEE RIGHT WO CONTRAST  Result Date: 08/19/2020 CLINICAL DATA:  Knee pain and swelling. EXAM: MRI OF THE RIGHT  KNEE WITHOUT CONTRAST TECHNIQUE: Multiplanar, multisequence MR imaging of the knee was performed. No intravenous contrast was administered. COMPARISON:  Radiographs 08/18/2020 FINDINGS: MENISCI Medial meniscus: Oblique coursing inferior articular surface tear with a flap component and flipped meniscal fragment in the medial gutter. Lateral meniscus:  Intact LIGAMENTS Cruciates:  Intact Collaterals:  Intact CARTILAGE Patellofemoral:  Normal Medial:  Mild degenerative chondrosis. Lateral:  Minimal degenerative chondrosis. Joint:  Moderate-sized joint effusion and mild synovitis. Popliteal Fossa:  Moderate-sized leaking Baker's cyst. Extensor Mechanism: The patella retinacular structures are intact and the quadriceps and patellar tendons are intact. Bones: No acute bony findings. No bone contusion, marrow edema or osteochondral lesion. Other: Unremarkable knee musculature. IMPRESSION: 1. Medial meniscus tear. 2. Intact ligamentous structures and no acute bony findings. 3. Moderate-sized joint effusion and mild synovitis. Moderate-sized leaking Baker's cyst. Electronically Signed   By: Marijo Sanes M.D.   On: 08/19/2020 06:44   DG Knee Complete 4 Views Right  Result Date: 08/18/2020 CLINICAL DATA:  Fluid buildup EXAM: RIGHT KNEE - COMPLETE 4+ VIEW COMPARISON:  None. FINDINGS: No fracture or malalignment. Joint spaces appear maintained. Sclerotic and lucent lesion within the distal shaft of the femur with cortical thickening/sclerosis. No associated soft tissue mass. Positive for knee effusion. IMPRESSION: 1. No acute osseous abnormality. 2. Positive for knee effusion. 3. Sclerotic and lucent lesion within the distal shaft of the femur, nonspecific in appearance and without highly aggressive features, however if focal pain in the region, further evaluation with nonemergent MRI would be advised. Electronically Signed   By: Donavan Foil M.D.   On: 08/18/2020 20:32    Review of Systems  Constitutional: Negative  for chills, diaphoresis and fever.  HENT: Negative for ear discharge, ear pain, hearing loss and tinnitus.   Eyes: Negative for photophobia and pain.  Respiratory: Negative for cough and shortness of breath.   Cardiovascular: Negative for chest pain.  Gastrointestinal: Negative for abdominal pain, nausea and vomiting.  Genitourinary: Negative for dysuria, flank pain, frequency and urgency.  Musculoskeletal: Positive for arthralgias (Right knee). Negative for back pain, myalgias and neck pain.  Neurological: Negative for dizziness and headaches.  Hematological: Does not bruise/bleed easily.  Psychiatric/Behavioral: The patient is not nervous/anxious.    Blood pressure  126/64, pulse 87, temperature 98 F (36.7 C), resp. rate 17, height 5\' 9"  (1.753 m), weight 79.8 kg, SpO2 100 %. Physical Exam Constitutional:      General: He is not in acute distress.    Appearance: He is well-developed and well-nourished. He is not diaphoretic.  HENT:     Head: Normocephalic and atraumatic.  Eyes:     General: No scleral icterus.       Right eye: No discharge.        Left eye: No discharge.     Conjunctiva/sclera: Conjunctivae normal.  Cardiovascular:     Rate and Rhythm: Normal rate and regular rhythm.  Pulmonary:     Effort: Pulmonary effort is normal. No respiratory distress.  Musculoskeletal:     Cervical back: Normal range of motion.     Comments: RLE No traumatic wounds, ecchymosis, or rash  Mild TTP knee, erythema anteriorly, mild warmth, able to AROM knee 80-180  Mod knee effusion  Knee stable to varus/ valgus and anterior/posterior stress  Sens DPN, SPN, TN intact  Motor EHL, ext, flex, evers 5/5  DP 2+, PT 2+, No significant edema  Skin:    General: Skin is warm and dry.  Neurological:     Mental Status: He is alert.  Psychiatric:        Mood and Affect: Mood and affect normal.        Behavior: Behavior normal.     Assessment/Plan: Right knee effusion -- Will attempt  arthrocentesis again. Possibility of sepsis extremely low given ROM.    Lisette Abu, PA-C Orthopedic Surgery [phone] 08/19/2020, 10:28 AM

## 2020-08-19 NOTE — ED Notes (Signed)
Lunch tray ordered 

## 2020-08-19 NOTE — ED Notes (Signed)
Patient denies pain and is resting comfortably.  

## 2020-08-19 NOTE — ED Provider Notes (Signed)
[facility] EMERGENCY DEPARTMENT Provider Note   CSN: [id number] Arrival date & time: 08/18/20  1825     History Chief Complaint  Patient presents with  . Knee Pain    Joshua Lewis is a 59 y.o. male.  HPI      59 year old male with history of renal carcinoma, chronic back pain, opiate dependence, major depressive disorder, recent admission for GI bleed, presents with concern for knee pain.  Had previous evaluation for knee effusion 2 years ago, with negative cultures and no crystals seen.  Reports this pain began 5 days ago.  Denies any history of trauma.  Reports that the pain is severe, 10 out of 10.  It has not been relieved with Tylenol and Motrin used at home.  Pain is constant, worse with movements. Also notes area of swelling above knee cap which is new.  Aching pain across knee. No fevers, chills, nausea or vomiting.  Reports the knee itself feels warm.   Past Medical History:  Diagnosis Date  . Chronic back pain greater than 3 months duration   . Kidney carcinoma [facility])     Patient Active Problem List   Diagnosis Date Noted  . GI bleed 08/08/2020  . MDD (major depressive disorder), severe ([place]) 01/28/2020  . Opiate dependence ([place]) 01/28/2020    Past Surgical History:  Procedure Laterality Date  . ESOPHAGOGASTRODUODENOSCOPY (EGD) WITH PROPOFOL Left 08/09/2020   Procedure: ESOPHAGOGASTRODUODENOSCOPY (EGD) WITH PROPOFOL;  Surgeon: Clarene Essex, MD;  Location: [facility];  Service: Endoscopy;  Laterality: Left;  . HAND SURGERY    . HERNIA REPAIR    . NEPHRECTOMY     right       Family History  Problem Relation Age of Onset  . Cancer Mother   . Diabetes Mother   . Hypertension Mother     Social History   Tobacco Use  . Smoking status: Current Some Day Smoker    Packs/day: 0.50  . Smokeless tobacco: Never Used  Vaping Use  . Vaping Use: Never used  Substance Use Topics  . Alcohol use: No  . Drug use: No    Home Medications Prior  to Admission medications   Medication Sig Start Date End Date Taking? Authorizing Provider  acetaminophen (TYLENOL) 325 MG tablet Take 2 tablets (650 mg total) by mouth every 6 (six) hours as needed for mild pain (or Fever >/= 101). 08/10/20  Yes Dagar, Meredith Staggers, MD  baclofen (LIORESAL) 10 MG tablet Take 1 tablet (10 mg total) by mouth 3 (three) times daily. Patient not taking: Reported on 08/19/2020 08/10/20   Dagar, Meredith Staggers, MD  pantoprazole (PROTONIX) 40 MG tablet Take 1 tablet (40 mg total) by mouth 2 (two) times daily. Patient not taking: Reported on 08/19/2020 08/10/20   Dagar, Meredith Staggers, MD  Ibuprofen-Diphenhydramine HCl (ADVIL PM) 200-25 MG CAPS Take 1 tablet by mouth at bedtime. To help sleep  11/25/11  [provider]    Allergies    Patient has no known allergies.  Review of Systems   Review of Systems  Constitutional: Negative for chills and fever.  Respiratory: Negative for shortness of breath.   Cardiovascular: Negative for chest pain.  Gastrointestinal: Negative for nausea and vomiting.  Musculoskeletal: Positive for arthralgias. Negative for back pain and neck stiffness.  Skin: Negative for rash.  Neurological: Negative for syncope.    Physical Exam Updated Vital Signs BP 134/75   Pulse 72   Temp 98 F (36.7 C)   Resp 16  Ht 5\' 9"  (1.753 m)   Wt 79.8 kg   SpO2 100%   BMI 25.99 kg/m   Physical Exam Vitals and nursing note reviewed.  Constitutional:      General: He is not in acute distress.    Appearance: Normal appearance. He is not ill-appearing, toxic-appearing or diaphoretic.  HENT:     Head: Normocephalic.  Eyes:     Conjunctiva/sclera: Conjunctivae normal.  Cardiovascular:     Rate and Rhythm: Normal rate and regular rhythm.     Pulses: Normal pulses.  Pulmonary:     Effort: Pulmonary effort is normal. No respiratory distress.  Musculoskeletal:        General: Swelling (right knee) present. No deformity or signs of injury.     Cervical back:  No rigidity.  Skin:    General: Skin is warm and dry.     Coloration: Skin is not jaundiced or pale.     Comments: Skin darkening and area of swelling on most anterior portion of knee  Neurological:     General: No focal deficit present.     Mental Status: He is alert and oriented to person, place, and time.     ED Results / Procedures / Treatments   Labs (all labs ordered are listed, but only abnormal results are displayed) Labs Reviewed  SYNOVIAL CELL COUNT + DIFF, W/ CRYSTALS - Abnormal; Notable for the following components:      Result Value   Appearance-Synovial TURBID (*)    WBC, Synovial 17,875 (*)    Neutrophil, Synovial 94 (*)    Monocyte-Macrophage-Synovial Fluid 6 (*)    All other components within normal limits  CBC WITH DIFFERENTIAL/PLATELET - Abnormal; Notable for the following components:   WBC 12.1 (*)    Hemoglobin 12.5 (*)    Platelets 404 (*)    Neutro Abs 8.3 (*)    All other components within normal limits  COMPREHENSIVE METABOLIC PANEL - Abnormal; Notable for the following components:   Glucose, Bld 102 (*)    Albumin 3.2 (*)    All other components within normal limits  SEDIMENTATION RATE - Abnormal; Notable for the following components:   Sed Rate 33 (*)    All other components within normal limits  C-REACTIVE PROTEIN - Abnormal; Notable for the following components:   CRP 3.0 (*)    All other components within normal limits  BODY FLUID CULTURE    EKG None  Radiology MR KNEE RIGHT WO CONTRAST  Result Date: 08/19/2020 CLINICAL DATA:  Knee pain and swelling. EXAM: MRI OF THE RIGHT KNEE WITHOUT CONTRAST TECHNIQUE: Multiplanar, multisequence MR imaging of the knee was performed. No intravenous contrast was administered. COMPARISON:  Radiographs 08/18/2020 FINDINGS: MENISCI Medial meniscus: Oblique coursing inferior articular surface tear with a flap component and flipped meniscal fragment in the medial gutter. Lateral meniscus:  Intact LIGAMENTS  Cruciates:  Intact Collaterals:  Intact CARTILAGE Patellofemoral:  Normal Medial:  Mild degenerative chondrosis. Lateral:  Minimal degenerative chondrosis. Joint:  Moderate-sized joint effusion and mild synovitis. Popliteal Fossa:  Moderate-sized leaking Baker's cyst. Extensor Mechanism: The patella retinacular structures are intact and the quadriceps and patellar tendons are intact. Bones: No acute bony findings. No bone contusion, marrow edema or osteochondral lesion. Other: Unremarkable knee musculature. IMPRESSION: 1. Medial meniscus tear. 2. Intact ligamentous structures and no acute bony findings. 3. Moderate-sized joint effusion and mild synovitis. Moderate-sized leaking Baker's cyst. Electronically Signed   By: Marijo Sanes M.D.   On: 08/19/2020 06:44  DG Knee Complete 4 Views Right  Result Date: 08/18/2020 CLINICAL DATA:  Fluid buildup EXAM: RIGHT KNEE - COMPLETE 4+ VIEW COMPARISON:  None. FINDINGS: No fracture or malalignment. Joint spaces appear maintained. Sclerotic and lucent lesion within the distal shaft of the femur with cortical thickening/sclerosis. No associated soft tissue mass. Positive for knee effusion. IMPRESSION: 1. No acute osseous abnormality. 2. Positive for knee effusion. 3. Sclerotic and lucent lesion within the distal shaft of the femur, nonspecific in appearance and without highly aggressive features, however if focal pain in the region, further evaluation with nonemergent MRI would be advised. Electronically Signed   By: Donavan Foil M.D.   On: 08/18/2020 20:32    Procedures .Joint Aspiration/Arthrocentesis  Date/Time: 08/19/2020 4:33 PM Performed by: Gareth Morgan, MD Authorized by: Gareth Morgan, MD   Consent:    Consent obtained:  Verbal   Consent given by:  Patient   Risks, benefits, and alternatives were discussed: yes     Risks discussed:  Bleeding, infection, nerve damage, incomplete drainage and pain   Alternatives discussed:  No  treatment Universal protocol:    Patient identity confirmed:  Verbally with patient Location:    Location:  Knee   Knee:  R knee Procedure details:    Approach:  Lateral   Aspirate amount:  Drops   Aspirate characteristics:  Blood-tinged   Specimen collected: no   Post-procedure details:    Dressing:  Gauze roll Comments:     Arthrocentesis attempted without successful acquisition of enough fluid for testing.   (including critical care time)  Medications Ordered in ED Medications  morphine 4 MG/ML injection 4 mg (4 mg Intravenous Given 08/19/20 0341)  lidocaine (PF) (XYLOCAINE) 1 % injection 5 mL (5 mLs Other Given 08/19/20 0342)  HYDROmorphone (DILAUDID) injection 1 mg (1 mg Intravenous Given 08/19/20 0512)  HYDROcodone-acetaminophen (NORCO/VICODIN) 5-325 MG per tablet 1 tablet (1 tablet Oral Given 08/19/20 0914)  bupivacaine (MARCAINE) 0.5 % injection 10 mL (10 mLs Infiltration Given 08/19/20 1151)  methylPREDNISolone acetate (DEPO-MEDROL) injection 80 mg (80 mg Intra-articular Given 08/19/20 1151)    ED Course  I have reviewed the triage vital signs and the nursing notes.  Pertinent labs & imaging results that were available during my care of the patient were reviewed by me and considered in my medical decision making (see chart for details).    MDM Rules/Calculators/A&P                          59 year old male with history of renal carcinoma, chronic back pain, opiate dependence, major depressive disorder, recent admission for GI bleed, presents with concern for knee pain.  XR shows sclerotic and lucent lesion within the distal shaft of the femur and knee joint effusion.  Given presence of this abnormal lesion on XR< effusion, pain and joint warmth discussed risks of possible septic arthritis.  Labs with elevated CRP, WBC elevated, temp 99.    Unfortunately only able to obtain drops of synovial fluid followed by bloody aspiration.  Had lesion that reports is new and tender to  most anterior portion of knee--performed incision completed without purulence expressed and do not feel this represents abscess.  Given abnormality of distal femur/lesion on XR and pain, obtained MRI knee.Marland Kitchen  Unfortunately, the MRI does not go proximally enough to evaluate the lesion seen on XR.  Called Orthopedics regarding this abnormality/r/o osteomyleitis, dry tap. MRI does show meniscal abnormality which is likely cause of pain.  Care signed out with Orthopedic evaluation pending.     Final Clinical Impression(s) / ED Diagnoses Final diagnoses:  Knee pain, unspecified chronicity, unspecified laterality    Rx / DC Orders ED Discharge Orders    None       Gareth Morgan, MD 08/19/20 1635

## 2020-08-19 NOTE — ED Notes (Signed)
Patient transported to MRI 

## 2020-08-22 LAB — BODY FLUID CULTURE: Culture: NO GROWTH

## 2021-03-31 ENCOUNTER — Other Ambulatory Visit (HOSPITAL_BASED_OUTPATIENT_CLINIC_OR_DEPARTMENT_OTHER): Payer: Self-pay

## 2021-08-22 ENCOUNTER — Emergency Department (HOSPITAL_COMMUNITY): Payer: No Typology Code available for payment source

## 2021-08-22 ENCOUNTER — Inpatient Hospital Stay (HOSPITAL_COMMUNITY)
Admission: EM | Admit: 2021-08-22 | Discharge: 2021-08-25 | DRG: 917 | Disposition: A | Discharge status: Home Health | Payer: No Typology Code available for payment source | Attending: Internal Medicine | Admitting: Internal Medicine

## 2021-08-22 DIAGNOSIS — Z8249 Family history of ischemic heart disease and other diseases of the circulatory system: Secondary | ICD-10-CM

## 2021-08-22 DIAGNOSIS — T40601A Poisoning by unspecified narcotics, accidental (unintentional), initial encounter: Secondary | ICD-10-CM | POA: Diagnosis not present

## 2021-08-22 DIAGNOSIS — Y92810 Car as the place of occurrence of the external cause: Secondary | ICD-10-CM

## 2021-08-22 DIAGNOSIS — Z905 Acquired absence of kidney: Secondary | ICD-10-CM

## 2021-08-22 DIAGNOSIS — F1110 Opioid abuse, uncomplicated: Secondary | ICD-10-CM | POA: Diagnosis present

## 2021-08-22 DIAGNOSIS — R569 Unspecified convulsions: Secondary | ICD-10-CM | POA: Diagnosis present

## 2021-08-22 DIAGNOSIS — R4182 Altered mental status, unspecified: Secondary | ICD-10-CM | POA: Diagnosis present

## 2021-08-22 DIAGNOSIS — N189 Chronic kidney disease, unspecified: Secondary | ICD-10-CM | POA: Diagnosis present

## 2021-08-22 DIAGNOSIS — N179 Acute kidney failure, unspecified: Secondary | ICD-10-CM | POA: Diagnosis present

## 2021-08-22 DIAGNOSIS — J690 Pneumonitis due to inhalation of food and vomit: Secondary | ICD-10-CM | POA: Diagnosis present

## 2021-08-22 DIAGNOSIS — F17210 Nicotine dependence, cigarettes, uncomplicated: Secondary | ICD-10-CM | POA: Diagnosis present

## 2021-08-22 DIAGNOSIS — Z20822 Contact with and (suspected) exposure to covid-19: Secondary | ICD-10-CM | POA: Diagnosis present

## 2021-08-22 DIAGNOSIS — J9601 Acute respiratory failure with hypoxia: Secondary | ICD-10-CM | POA: Diagnosis present

## 2021-08-22 DIAGNOSIS — G8929 Other chronic pain: Secondary | ICD-10-CM | POA: Diagnosis present

## 2021-08-22 DIAGNOSIS — Z833 Family history of diabetes mellitus: Secondary | ICD-10-CM

## 2021-08-22 DIAGNOSIS — Z85528 Personal history of other malignant neoplasm of kidney: Secondary | ICD-10-CM

## 2021-08-22 LAB — I-STAT ARTERIAL BLOOD GAS, ED
Acid-Base Excess: 3 mmol/L — ABNORMAL HIGH (ref 0.0–2.0)
Bicarbonate: 29.9 mmol/L — ABNORMAL HIGH (ref 20.0–28.0)
Calcium, Ion: 1.18 mmol/L (ref 1.15–1.40)
HCT: 43 % (ref 39.0–52.0)
Hemoglobin: 14.6 g/dL (ref 13.0–17.0)
O2 Saturation: 100 %
Patient temperature: 98.6
Potassium: 3.8 mmol/L (ref 3.5–5.1)
Sodium: 137 mmol/L (ref 135–145)
TCO2: 32 mmol/L (ref 22–32)
pCO2 arterial: 53 mmHg — ABNORMAL HIGH (ref 32.0–48.0)
pH, Arterial: 7.36 (ref 7.350–7.450)
pO2, Arterial: 553 mmHg — ABNORMAL HIGH (ref 83.0–108.0)

## 2021-08-22 LAB — CBC WITH DIFFERENTIAL/PLATELET
Abs Immature Granulocytes: 0.09 10*3/uL — ABNORMAL HIGH (ref 0.00–0.07)
Basophils Absolute: 0.1 10*3/uL (ref 0.0–0.1)
Basophils Relative: 0 %
Eosinophils Absolute: 0 10*3/uL (ref 0.0–0.5)
Eosinophils Relative: 0 %
HCT: 44.3 % (ref 39.0–52.0)
Hemoglobin: 13.8 g/dL (ref 13.0–17.0)
Immature Granulocytes: 1 %
Lymphocytes Relative: 7 %
Lymphs Abs: 1.3 10*3/uL (ref 0.7–4.0)
MCH: 28.2 pg (ref 26.0–34.0)
MCHC: 31.2 g/dL (ref 30.0–36.0)
MCV: 90.4 fL (ref 80.0–100.0)
Monocytes Absolute: 1 10*3/uL (ref 0.1–1.0)
Monocytes Relative: 6 %
Neutro Abs: 14.7 10*3/uL — ABNORMAL HIGH (ref 1.7–7.7)
Neutrophils Relative %: 86 %
Platelets: 273 10*3/uL (ref 150–400)
RBC: 4.9 MIL/uL (ref 4.22–5.81)
RDW: 16.8 % — ABNORMAL HIGH (ref 11.5–15.5)
WBC: 17.1 10*3/uL — ABNORMAL HIGH (ref 4.0–10.5)
nRBC: 0 % (ref 0.0–0.2)

## 2021-08-22 LAB — TROPONIN I (HIGH SENSITIVITY): Troponin I (High Sensitivity): 9 ng/L (ref <18)

## 2021-08-22 LAB — COMPREHENSIVE METABOLIC PANEL
ALT: 24 U/L (ref 0–44)
AST: 38 U/L (ref 15–41)
Albumin: 3.9 g/dL (ref 3.5–5.0)
Alkaline Phosphatase: 91 U/L (ref 38–126)
Anion gap: 13 (ref 5–15)
BUN: 18 mg/dL (ref 8–23)
CO2: 23 mmol/L (ref 22–32)
Calcium: 8.6 mg/dL — ABNORMAL LOW (ref 8.9–10.3)
Chloride: 102 mmol/L (ref 98–111)
Creatinine, Ser: 1.34 mg/dL — ABNORMAL HIGH (ref 0.61–1.24)
GFR, Estimated: >60 mL/min (ref >60)
Glucose, Bld: 233 mg/dL — ABNORMAL HIGH (ref 70–99)
Potassium: 4.3 mmol/L (ref 3.5–5.1)
Sodium: 138 mmol/L (ref 135–145)
Total Bilirubin: 0.4 mg/dL (ref 0.3–1.2)
Total Protein: 6.8 g/dL (ref 6.5–8.1)

## 2021-08-22 LAB — ETHANOL: Alcohol, Ethyl (B): <10 mg/dL (ref <10)

## 2021-08-22 MED ORDER — SUCCINYLCHOLINE CHLORIDE 200 MG/10ML IV SOSY
PREFILLED_SYRINGE | INTRAVENOUS | Status: AC
  Filled 2021-08-22: qty 10

## 2021-08-22 MED ORDER — ETOMIDATE 2 MG/ML IV SOLN
INTRAVENOUS | Status: AC
  Filled 2021-08-22: qty 20

## 2021-08-22 MED ORDER — ROCURONIUM BROMIDE 10 MG/ML (PF) SYRINGE
PREFILLED_SYRINGE | INTRAVENOUS | Status: AC
  Filled 2021-08-22: qty 10

## 2021-08-22 MED ORDER — LORAZEPAM 2 MG/ML IJ SOLN
INTRAMUSCULAR | Status: AC
  Administered 2021-08-22: 2 mg
  Filled 2021-08-22: qty 1

## 2021-08-22 MED ORDER — ONDANSETRON HCL 4 MG/2ML IJ SOLN
INTRAMUSCULAR | Status: AC
  Filled 2021-08-22: qty 2

## 2021-08-22 MED ORDER — PROPOFOL 1000 MG/100ML IV EMUL
5.0000 ug/kg/min | INTRAVENOUS | Status: DC
  Administered 2021-08-22: 10 ug/kg/min via INTRAVENOUS
  Filled 2021-08-22: qty 100

## 2021-08-22 NOTE — ED Provider Notes (Signed)
[facility] EMERGENCY DEPARTMENT Provider Note   CSN: [id number] Arrival date & time: 08/22/21  2249     History Chief Complaint  Patient presents with   Altered Mental Status    Pt biba from car, unresponsive, unknown down time. Hx of opiod abuse    Joshua Lewis is a 60 y.o. male.  The history is provided by the patient and medical records.  Altered Mental Status Presenting symptoms: unresponsiveness   Severity:  Severe Most recent episode:  Today Episode history:  Unable to specify Timing:  Constant Progression:  Partially resolved Chronicity:  New Context comment:  Suspected drug overdose, given narcan en route     Past Medical History:  Diagnosis Date   Chronic back pain greater than 3 months duration    Kidney carcinoma [facility])     Patient Active Problem List   Diagnosis Date Noted   GI bleed 08/08/2020   MDD (major depressive disorder), severe ([place]) 01/28/2020   Opiate dependence ([place]) 01/28/2020    Past Surgical History:  Procedure Laterality Date   ESOPHAGOGASTRODUODENOSCOPY (EGD) WITH PROPOFOL Left 08/09/2020   Procedure: ESOPHAGOGASTRODUODENOSCOPY (EGD) WITH PROPOFOL;  Surgeon: Clarene Essex, MD;  Location: [place] [facility];  Service: Endoscopy;  Laterality: Left;   HAND SURGERY     HERNIA REPAIR     NEPHRECTOMY     right       Family History  Problem Relation Age of Onset   Cancer Mother    Diabetes Mother    Hypertension Mother     Social History   Tobacco Use   Smoking status: Some Days    Packs/day: 0.50    Types: Cigarettes   Smokeless tobacco: Never  Vaping Use   Vaping Use: Never used  Substance Use Topics   Alcohol use: No   Drug use: No    Home Medications Prior to Admission medications   Medication Sig Start Date End Date Taking? Authorizing Provider  pantoprazole (PROTONIX) 40 MG tablet TAKE 1 TABLET (40 MG TOTAL) BY MOUTH TWO TIMES DAILY. Patient not taking: Reported on 08/19/2020 08/10/20 08/10/21  Dagar,  Meredith Staggers, MD  Ibuprofen-Diphenhydramine HCl (ADVIL PM) 200-25 MG CAPS Take 1 tablet by mouth at bedtime. To help sleep  11/25/11  [provider]    Allergies    Patient has no known allergies.  Review of Systems   Review of Systems  Unable to perform ROS: Mental status change   Physical Exam Updated Vital Signs BP (!) 148/117   Pulse (!) 105   Resp (!) 6   Ht _0  (1.753 m)   Wt 79.8 kg Comment: from 2021 records, please update  SpO2 99%   BMI 25.99 kg/m   Physical Exam Vitals and nursing note reviewed.  Constitutional:      General: He is in acute distress.     Appearance: He is ill-appearing.     Comments: NPA in place to L nare  HENT:     Head: Normocephalic and atraumatic.     Right Ear: External ear normal.     Left Ear: External ear normal.     Nose: Nose normal. No congestion.     Mouth/Throat:     Mouth: Mucous membranes are moist.     Pharynx: Oropharynx is clear. No posterior oropharyngeal erythema.  Eyes:     Extraocular Movements: Extraocular movements intact.     Conjunctiva/sclera: Conjunctivae normal.     Comments: 3 mm pupils bilaterally, equal and reactive  Cardiovascular:  Rate and Rhythm: Regular rhythm. Tachycardia present.     Pulses: Normal pulses.     Heart sounds: No murmur heard. Pulmonary:     Effort: Respiratory distress (Bradypneic) present.     Breath sounds: Normal breath sounds. No wheezing, rhonchi or rales.  Abdominal:     General: Abdomen is flat. Bowel sounds are normal.     Palpations: Abdomen is soft.     Tenderness: There is no abdominal tenderness. There is no guarding or rebound.  Musculoskeletal:        General: No swelling, tenderness, deformity or signs of injury. Normal range of motion.     Cervical back: Normal range of motion.  Skin:    General: Skin is warm and dry.     Capillary Refill: Capillary refill takes less than 2 seconds.     Findings: No rash.  Neurological:     Mental Status: He is  unresponsive.     GCS: GCS eye subscore is 4. GCS verbal subscore is 1. GCS motor subscore is 3.     Motor: Abnormal muscle tone and seizure activity (Coarse tremors of the bilateral upper extremities with increased tone.  Resistant to passive range of motion.) present.  Psychiatric:        Mood and Affect: Mood normal.    ED Results / Procedures / Treatments   Labs (all labs ordered are listed, but only abnormal results are displayed) Labs Reviewed  CBC WITH DIFFERENTIAL/PLATELET - Abnormal; Notable for the following components:      Result Value   WBC 17.1 (*)    RDW 16.8 (*)    Neutro Abs 14.7 (*)    Abs Immature Granulocytes 0.09 (*)    All other components within normal limits  I-STAT ARTERIAL BLOOD GAS, ED - Abnormal; Notable for the following components:   pCO2 arterial 53.0 (*)    pO2, Arterial 553 (*)    Bicarbonate 29.9 (*)    Acid-Base Excess 3.0 (*)    All other components within normal limits  COMPREHENSIVE METABOLIC PANEL  ETHANOL  RAPID URINE DRUG SCREEN, HOSP PERFORMED  TROPONIN I (HIGH SENSITIVITY)    EKG None  Radiology DG Chest Portable 1 View  Result Date: 08/22/2021 CLINICAL DATA:  Post intubation EXAM: PORTABLE CHEST 1 VIEW COMPARISON:  05/13/2020 FINDINGS: Endotracheal tube terminates 5 cm above the carina. Enteric tube courses into the stomach. Lungs are essentially clear. Possible trace left pleural effusion. No pneumothorax. The heart is normal in size. IMPRESSION: Endotracheal tube terminates 5 cm above the carina. Enteric tube courses into the stomach. Possible trace left pleural effusion. Electronically Signed   By: Julian Hy M.D.   On: 08/22/2021 23:24    Procedures Date/Time: 08/22/2021 11:11 PM Performed by: Idamae Lusher, MD Pre-anesthesia Checklist: Patient identified, Emergency Drugs available, Suction available, Patient being monitored and Timeout performed Oxygen Delivery Method: Ambu bag Preoxygenation: Pre-oxygenation with  100% oxygen Induction Type: Rapid sequence Ventilation: Mask ventilation without difficulty Laryngoscope Size: Mac and 3 Grade View: Grade I Tube size: 8.0 mm Number of attempts: 1 Airway Equipment and Method: Rigid stylet Placement Confirmation: ETT inserted through vocal cords under direct vision, Positive ETCO2, CO2 detector and Breath sounds checked- equal and bilateral Secured at: 25 cm Tube secured with: ETT holder Dental Injury: Teeth and Oropharynx as per pre-operative assessment  Difficulty Due To: Difficulty was anticipated      Medications Ordered in ED Medications  succinylcholine (ANECTINE) 200 MG/10ML syringe (has no administration in time  range)  propofol (DIPRIVAN) 1000 MG/100ML infusion (10 mcg/kg/min  79.8 kg Intravenous New Bag/Given 08/22/21 2319)  rocuronium bromide 100 MG/10ML SOSY (  Given 08/22/21 2258)  etomidate (AMIDATE) 2 MG/ML injection (  Given 08/22/21 2258)  ondansetron (ZOFRAN) 4 MG/2ML injection (  Given 08/22/21 2256)  LORazepam (ATIVAN) 2 MG/ML injection (2 mg  Given 08/22/21 2256)    ED Course  I have reviewed the triage vital signs and the nursing notes.  Pertinent labs & imaging results that were available during my care of the patient were reviewed by me and considered in my medical decision making (see chart for details).    MDM Rules/Calculators/A&P                          60 year old male with history of hypertension and CKD presenting with unresponsiveness following suspected drug overdose.  Patient given Narcan in route by EMS.  He became alert after naloxone, but after a period of time he became unresponsive and developed a leftward stare and had diffuse tremor and increased tone of his extremities.  He remains unresponsive on arrival.  Attempted 2 mg of Ativan without resolution of seizure-like activity.  The patient then began vomiting.  He was intubated for airway protection as above.  He was sent for emergent head imaging to  further evaluate for intracranial hemorrhage.  He has no documented seizure history.  Altered mental status and questionable seizure activity may be related to hypoxic ischemic event in the setting of suspected drug overdose.  There are also concerns for possible intracranial mass or abscess in setting of polysubstance use.  X-ray showed adequate tube placement with no focal infiltrates.  Patient awaiting head CT and toxic metabolic labs at shift change.  Current status and plan of care discussed with oncoming physician.   Final Clinical Impression(s) / ED Diagnoses Final diagnoses:  Altered mental status, unspecified altered mental status type    Rx / DC Orders ED Discharge Orders     None        Idamae Lusher, MD 08/22/21 2339    Margette Fast, MD 08/25/21 [phone]

## 2021-08-22 NOTE — ED Triage Notes (Signed)
Pt biba found unresponive in car by family, unknown downtime. Pt given 4mg  narcan IN, NPA inplace. Pt vomiting on arrival to room.

## 2021-08-22 NOTE — ED Provider Notes (Signed)
Care assumed from Dr. Laverta Baltimore, patient brought in unresponsive, possible opiate overdose but failed to respond adequately to naloxone.  Currently intubated and being sent for CT of head.  CBC shows mild leukocytosis, other labs pending.  CT of head is normal.  ABG shows slight elevation of PCO2 with normal pH.  Mild renal insufficiency is present not significantly changed from baseline.  Ethanol level is not detectable.  Drug screen is pending.  Case is discussed with Dr. Patsey Berthold of critical care service who agrees to admit the patient.   Delora Fuel, MD 13/64/38 Benancio Deeds

## 2021-08-23 ENCOUNTER — Inpatient Hospital Stay (HOSPITAL_COMMUNITY): Payer: No Typology Code available for payment source

## 2021-08-23 DIAGNOSIS — G8929 Other chronic pain: Secondary | ICD-10-CM

## 2021-08-23 DIAGNOSIS — Z905 Acquired absence of kidney: Secondary | ICD-10-CM | POA: Diagnosis not present

## 2021-08-23 DIAGNOSIS — Z833 Family history of diabetes mellitus: Secondary | ICD-10-CM | POA: Diagnosis not present

## 2021-08-23 DIAGNOSIS — Y92810 Car as the place of occurrence of the external cause: Secondary | ICD-10-CM | POA: Diagnosis not present

## 2021-08-23 DIAGNOSIS — T402X1A Poisoning by other opioids, accidental (unintentional), initial encounter: Secondary | ICD-10-CM

## 2021-08-23 DIAGNOSIS — R4182 Altered mental status, unspecified: Secondary | ICD-10-CM | POA: Diagnosis present

## 2021-08-23 DIAGNOSIS — T50901A Poisoning by unspecified drugs, medicaments and biological substances, accidental (unintentional), initial encounter: Secondary | ICD-10-CM | POA: Diagnosis not present

## 2021-08-23 DIAGNOSIS — F1110 Opioid abuse, uncomplicated: Secondary | ICD-10-CM | POA: Diagnosis present

## 2021-08-23 DIAGNOSIS — J690 Pneumonitis due to inhalation of food and vomit: Secondary | ICD-10-CM

## 2021-08-23 DIAGNOSIS — Z85528 Personal history of other malignant neoplasm of kidney: Secondary | ICD-10-CM | POA: Diagnosis not present

## 2021-08-23 DIAGNOSIS — R569 Unspecified convulsions: Secondary | ICD-10-CM

## 2021-08-23 DIAGNOSIS — N289 Disorder of kidney and ureter, unspecified: Secondary | ICD-10-CM

## 2021-08-23 DIAGNOSIS — Z8719 Personal history of other diseases of the digestive system: Secondary | ICD-10-CM | POA: Diagnosis not present

## 2021-08-23 DIAGNOSIS — Z8249 Family history of ischemic heart disease and other diseases of the circulatory system: Secondary | ICD-10-CM | POA: Diagnosis not present

## 2021-08-23 DIAGNOSIS — N189 Chronic kidney disease, unspecified: Secondary | ICD-10-CM | POA: Diagnosis present

## 2021-08-23 DIAGNOSIS — J9601 Acute respiratory failure with hypoxia: Secondary | ICD-10-CM

## 2021-08-23 DIAGNOSIS — F17210 Nicotine dependence, cigarettes, uncomplicated: Secondary | ICD-10-CM | POA: Diagnosis present

## 2021-08-23 DIAGNOSIS — M549 Dorsalgia, unspecified: Secondary | ICD-10-CM | POA: Diagnosis not present

## 2021-08-23 DIAGNOSIS — N179 Acute kidney failure, unspecified: Secondary | ICD-10-CM | POA: Diagnosis present

## 2021-08-23 DIAGNOSIS — T40601A Poisoning by unspecified narcotics, accidental (unintentional), initial encounter: Secondary | ICD-10-CM | POA: Diagnosis present

## 2021-08-23 DIAGNOSIS — Z20822 Contact with and (suspected) exposure to covid-19: Secondary | ICD-10-CM | POA: Diagnosis present

## 2021-08-23 LAB — CBC
HCT: 40.2 % (ref 39.0–52.0)
Hemoglobin: 13.1 g/dL (ref 13.0–17.0)
MCH: 27.9 pg (ref 26.0–34.0)
MCHC: 32.6 g/dL (ref 30.0–36.0)
MCV: 85.7 fL (ref 80.0–100.0)
Platelets: 226 10*3/uL (ref 150–400)
RBC: 4.69 MIL/uL (ref 4.22–5.81)
RDW: 16.6 % — ABNORMAL HIGH (ref 11.5–15.5)
WBC: 14.3 10*3/uL — ABNORMAL HIGH (ref 4.0–10.5)
nRBC: 0 % (ref 0.0–0.2)

## 2021-08-23 LAB — BASIC METABOLIC PANEL
Anion gap: 8 (ref 5–15)
BUN: 15 mg/dL (ref 8–23)
CO2: 26 mmol/L (ref 22–32)
Calcium: 8.8 mg/dL — ABNORMAL LOW (ref 8.9–10.3)
Chloride: 104 mmol/L (ref 98–111)
Creatinine, Ser: 1.27 mg/dL — ABNORMAL HIGH (ref 0.61–1.24)
GFR, Estimated: >60 mL/min (ref >60)
Glucose, Bld: 80 mg/dL (ref 70–99)
Potassium: 4.4 mmol/L (ref 3.5–5.1)
Sodium: 138 mmol/L (ref 135–145)

## 2021-08-23 LAB — RESP PANEL BY RT-PCR (FLU A&B, COVID) ARPGX2
Influenza A by PCR: NEGATIVE
Influenza B by PCR: NEGATIVE
SARS Coronavirus 2 by RT PCR: NEGATIVE

## 2021-08-23 LAB — GLUCOSE, CAPILLARY
Glucose-Capillary: 112 mg/dL — ABNORMAL HIGH (ref 70–99)
Glucose-Capillary: 90 mg/dL (ref 70–99)
Glucose-Capillary: 113 mg/dL — ABNORMAL HIGH (ref 70–99)
Glucose-Capillary: 107 mg/dL — ABNORMAL HIGH (ref 70–99)

## 2021-08-23 LAB — MAGNESIUM: Magnesium: 2 mg/dL (ref 1.7–2.4)

## 2021-08-23 LAB — MRSA NEXT GEN BY PCR, NASAL: MRSA by PCR Next Gen: NOT DETECTED

## 2021-08-23 LAB — PHOSPHORUS: Phosphorus: 3.8 mg/dL (ref 2.5–4.6)

## 2021-08-23 LAB — RAPID URINE DRUG SCREEN, HOSP PERFORMED
Amphetamines: NOT DETECTED
Barbiturates: NOT DETECTED
Benzodiazepines: NOT DETECTED
Cocaine: NOT DETECTED
Opiates: POSITIVE — AB
Tetrahydrocannabinol: NOT DETECTED

## 2021-08-23 LAB — HIV ANTIBODY (ROUTINE TESTING W REFLEX): HIV Screen 4th Generation wRfx: NONREACTIVE

## 2021-08-23 LAB — TROPONIN I (HIGH SENSITIVITY): Troponin I (High Sensitivity): 15 ng/L (ref <18)

## 2021-08-23 MED ORDER — CHLORHEXIDINE GLUCONATE 0.12% ORAL RINSE (MEDLINE KIT)
15.0000 mL | Freq: Two times a day (BID) | OROMUCOSAL | Status: DC
  Administered 2021-08-23 – 2021-08-24 (×3): 15 mL via OROMUCOSAL

## 2021-08-23 MED ORDER — DOCUSATE SODIUM 50 MG/5ML PO LIQD: 100.0000 mg | Freq: Two times a day (BID) | ORAL | Status: DC | PRN

## 2021-08-23 MED ORDER — CHLORHEXIDINE GLUCONATE CLOTH 2 % EX PADS
6.0000 | MEDICATED_PAD | Freq: Every day | CUTANEOUS | Status: DC
  Administered 2021-08-23: 6 via TOPICAL

## 2021-08-23 MED ORDER — ORAL CARE MOUTH RINSE
15.0000 mL | OROMUCOSAL | Status: DC
  Administered 2021-08-23 – 2021-08-25 (×18): 15 mL via OROMUCOSAL

## 2021-08-23 MED ORDER — POLYETHYLENE GLYCOL 3350 17 G PO PACK: 17.0000 g | PACK | Freq: Every day | ORAL | Status: DC | PRN

## 2021-08-23 MED ORDER — AMPICILLIN-SULBACTAM SODIUM 3 (2-1) G IJ SOLR
3.0000 g | Freq: Four times a day (QID) | INTRAMUSCULAR | Status: DC
  Administered 2021-08-23 (×2): 3 g via INTRAVENOUS
  Filled 2021-08-23 (×2): qty 8

## 2021-08-23 MED ORDER — PANTOPRAZOLE SODIUM 40 MG IV SOLR
40.0000 mg | Freq: Every day | INTRAVENOUS | Status: DC
  Administered 2021-08-23: 40 mg via INTRAVENOUS
  Filled 2021-08-23: qty 40

## 2021-08-23 MED ORDER — DOCUSATE SODIUM 100 MG PO CAPS: 100.0000 mg | ORAL_CAPSULE | Freq: Two times a day (BID) | ORAL | Status: DC | PRN

## 2021-08-23 MED ORDER — HEPARIN SODIUM (PORCINE) 5000 UNIT/ML IJ SOLN
5000.0000 [IU] | Freq: Three times a day (TID) | INTRAMUSCULAR | Status: DC
  Administered 2021-08-23 – 2021-08-25 (×8): 5000 [IU] via SUBCUTANEOUS
  Filled 2021-08-23 (×8): qty 1

## 2021-08-23 MED ORDER — SODIUM CHLORIDE 0.9 % IV SOLN
3.0000 g | Freq: Four times a day (QID) | INTRAVENOUS | Status: DC
  Administered 2021-08-23 – 2021-08-25 (×9): 3 g via INTRAVENOUS
  Filled 2021-08-23 (×9): qty 8

## 2021-08-23 NOTE — ED Notes (Signed)
Wife at bedside, asked if she wanted patients clothing that was cut off. Wife states no to throw them away, she removed patients wallet from pocket and personal belongings

## 2021-08-23 NOTE — Progress Notes (Signed)
eLink Physician-Brief Progress Note Patient Name: Joshua Lewis DOB: Jun 04, 1961 MRN: [id number]   Date of Service  08/23/2021  HPI/Events of Note  Patient transported to Ed after being found obtunded in his car with near respiratory arrest secondary to suspected illicit drug overdose, he was intubated in the ED for airway protection.  eICU Interventions  New Patient Evaluation.        Kerry Kass Tymar Polyak 08/23/2021, 3:09 AM

## 2021-08-23 NOTE — Evaluation (Addendum)
Physical Therapy Evaluation Patient Details Name: Daveyon Kitchings MRN: [id number] DOB: Jun 27, 1961 Today's Date: 08/23/2021  History of Present Illness  60 yo admitted 12/12 after found unresponsive in his car by family member with possible OD. Intubated 12/12-12/13. Head CT negative, EEG with encephalopathy. PMHx; LBP, renal CA s/p nephrectomy, renal insufficiency  Clinical Impression  Pt pleasantly confused with decreased attention, awareness, orientation and memory. Pt with generalized weakness, decreased transfers, balance and function who will benefit from acute therapy to decrease burden of care. Encouraged progressive mobility with nursing as cognition improves. If pt able to progress and has 24hr assist home may be appropriate but will depend on pt progression.   94% on RA HR 88 121/72 (87)       Recommendations for follow up therapy are one component of a multi-disciplinary discharge planning process, led by the attending physician.  Recommendations may be updated based on patient status, additional functional criteria and insurance authorization.  Follow Up Recommendations Home health PT    Assistance Recommended at Discharge Frequent or constant Supervision/Assistance  Functional Status Assessment Patient has had a recent decline in their functional status and demonstrates the ability to make significant improvements in function in a reasonable and predictable amount of time.  Equipment Recommendations  Rolling walker (2 wheels)    Recommendations for Other Services       Precautions / Restrictions Precautions Precautions: Fall      Mobility  Bed Mobility Overal bed mobility: Needs Assistance Bed Mobility: Supine to Sit;Sit to Supine     Supine to sit: Min guard;HOB elevated Sit to supine: Min assist   General bed mobility comments: guarding for lines and safety with HOB 20 degrees. Increased time and min assist to fully clear legs for return to bed     Transfers Overall transfer level: Needs assistance   Transfers: Sit to/from Stand Sit to Stand: Min assist           General transfer comment: Min assist to stand from bed and side step toward [facility] with mod cues and physical assist for balance with repetition of cues needed    Ambulation/Gait               General Gait Details: not safe to attempt without +2 at this time  Stairs            Wheelchair Mobility    Modified [name] (Stroke Patients Only)       Balance Overall balance assessment: Needs assistance   Sitting balance-Leahy Scale: Fair       Standing balance-Leahy Scale: Poor                               Pertinent Vitals/Pain Pain Assessment: No/denies pain    Home Living Family/patient expects to be discharged to:: Private residence Living Arrangements: Spouse/significant other Available Help at Discharge: Family;Available 24 hours/day Type of Home: House Home Access: Stairs to enter   Entrance Stairs-Number of Steps: 5 Alternate Level Stairs-Number of Steps: 7 Home Layout: Multi-level;Bed/bath upstairs Home Equipment: Conservation officer, nature (2 wheels) Additional Comments: 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    Prior Function Prior Level of Function : Independent/Modified Independent                     Hand Dominance        Extremity/Trunk Assessment   Upper Extremity Assessment Upper Extremity Assessment: Generalized weakness  Lower Extremity Assessment Lower Extremity Assessment: Generalized weakness    Cervical / Trunk Assessment Cervical / Trunk Assessment: Normal  Communication   Communication: No difficulties  Cognition Arousal/Alertness: Lethargic Behavior During Therapy: Flat affect Overall Cognitive Status: Impaired/Different from baseline Area of Impairment: Orientation;Memory;Following commands;Attention;Safety/judgement;Awareness;Problem solving                 Orientation  Level: Disoriented to;Time;Situation Current Attention Level: Focused Memory: Decreased short-term memory Following Commands: Follows one step commands consistently Safety/Judgement: Decreased awareness of deficits;Decreased awareness of safety Awareness: Intellectual Problem Solving: Slow processing;Requires verbal cues General Comments: pt unable to don sock with verbal cues alone. donned left sock and then pt tried to place additional sock over this one. Maintaining attention for seconds needing repetition for all cues and questions. Pt stating month as "April and October" when cued the month of Christmas he was unable to correct. Frequently stating "man I must have been sleeping"        General Comments      Exercises     Assessment/Plan    PT Assessment Patient needs continued PT services  PT Problem List Decreased strength;Decreased balance;Decreased cognition;Decreased activity tolerance;Decreased coordination;Decreased safety awareness;Decreased mobility       PT Treatment Interventions Gait training;Therapeutic activities;Therapeutic exercise;Stair training;Cognitive remediation;Neuromuscular re-education;Balance training;Functional mobility training;DME instruction;Patient/family education    PT Goals (Current goals can be found in the Care Plan section)  Acute Rehab PT Goals Patient Stated Goal: return home and to work PT Goal Formulation: Patient unable to participate in goal setting Time For Goal Achievement: 09/06/21 Potential to Achieve Goals: Fair    Frequency Min 3X/week   Barriers to discharge        Co-evaluation               AM-PAC PT "6 Clicks" Mobility  Outcome Measure Help needed turning from your back to your side while in a flat bed without using bedrails?: A Little Help needed moving from lying on your back to sitting on the side of a flat bed without using bedrails?: A Little Help needed moving to and from a bed to a chair (including a  wheelchair)?: A Little Help needed standing up from a chair using your arms (e.g., wheelchair or bedside chair)?: A Lot Help needed to walk in hospital room?: A Lot Help needed climbing 3-5 steps with a railing? : Total 6 Click Score: 14    End of Session   Activity Tolerance: Patient tolerated treatment well Patient left: in bed;with call bell/phone within reach;with bed alarm set Nurse Communication: Mobility status PT Visit Diagnosis: Other abnormalities of gait and mobility (R26.89);Muscle weakness (generalized) (M62.81);Unsteadiness on feet (R26.81)    Time: 9470-9628 PT Time Calculation (min) (ACUTE ONLY): 19 min   Charges:   PT Evaluation $PT Eval Moderate Complexity: 1 Mod          [place], PT Acute Rehabilitation Services Pager: [phone] Office: [phone]   Sandy Salaam Zniyah Midkiff 08/23/2021, 3:28 PM

## 2021-08-23 NOTE — Consult Note (Signed)
Neurology Consultation  Reason for Consult: Altered mental status following presumed opiate overdose Referring Physician: Dr. Tamala Julian  CC: "I'm hungry"  History is obtained from: Chart review, patient  HPI: Joshua Lewis is a 60 y.o. male with a medical history significant for chronic back pain with associated opiate use disorder and renal cell carcinoma s/p right nephrectomy who presented to the ED 12/12 via EMS for evaluation of respiratory arrest and unresponsive state after being found unresponsive in his car with snoring respirations by his wife. Patient's friend reported to patient's wife that Joshua Lewis had taken some pills but did not specify what type of pills he had ingested. On EMS arrival, the patient was found in respiratory distress and given Narcan with initial improvement in his mental status. Patient was then intubated but has been extubated earlier today.  He continues to have altered mental status with disorientation to time and situation, increased latency of responses and bradykinesia.   ROS: Unable to obtain due to altered mental status.   Past Medical History:  Diagnosis Date   Chronic back pain greater than 3 months duration    Kidney carcinoma [facility])    Past Surgical History:  Procedure Laterality Date   ESOPHAGOGASTRODUODENOSCOPY (EGD) WITH PROPOFOL Left 08/09/2020   Procedure: ESOPHAGOGASTRODUODENOSCOPY (EGD) WITH PROPOFOL;  Surgeon: Clarene Essex, MD;  Location: [place];  Service: Endoscopy;  Laterality: Left;   HAND SURGERY     HERNIA REPAIR     NEPHRECTOMY     right   Family History  Problem Relation Age of Onset   Cancer Mother    Diabetes Mother    Hypertension Mother    Social History:   reports that he has been smoking. He has been smoking an average of .5 packs per day. He has never used smokeless tobacco. He reports that he does not drink alcohol and does not use drugs.  Medications  Current Facility-Administered Medications:     Ampicillin-Sulbactam (UNASYN) 3 g in sodium chloride 0.9 % 100 mL IVPB, 3 g, Intravenous, Q6H, Candee Furbish, MD   chlorhexidine gluconate (MEDLINE KIT) (PERIDEX) 0.12 % solution 15 mL, 15 mL, Mouth Rinse, BID, Collier Bullock, MD, 15 mL at 08/23/21 0827   Chlorhexidine Gluconate Cloth 2 % PADS 6 each, 6 each, Topical, Daily, Collier Bullock, MD, 6 each at 08/23/21 0231   docusate sodium (COLACE) capsule 100 mg, 100 mg, Oral, BID PRN, Candee Furbish, MD   heparin injection 5,000 Units, 5,000 Units, Subcutaneous, Q8H, [place] Crane M, [place], 5,000 Units at 08/23/21 0507   MEDLINE mouth rinse, 15 mL, Mouth Rinse, 10 times per day, Collier Bullock, MD, 15 mL at 08/23/21 1059   polyethylene glycol (MIRALAX / GLYCOLAX) packet 17 g, 17 g, Oral, Daily PRN, Candee Furbish, MD   Exam: Current vital signs: BP 118/83    Pulse 79    Temp 97.7 F (36.5 C) (Bladder)    Resp 18    Ht '5\' 9"'  (1.753 m)    Wt 67.5 kg    SpO2 98%    BMI 21.98 kg/m  Vital signs in last 24 hours: Temp:  [97 F (36.1 C)-98.2 F (36.8 C)] 97.7 F (36.5 C) (12/13 1118) Pulse Rate:  [59-105] 79 (12/13 1100) Resp:  [0-24] 18 (12/13 1100) BP: (92-159)/(62-117) 118/83 (12/13 1100) SpO2:  [95 %-100 %] 98 % (12/13 1100) FiO2 (%):  [40 %-100 %] 40 % (12/13 0742) Weight:  [67.5 kg-79.8 kg] 67.5 kg (12/13 0227)  GENERAL: Awake,  drowsy, slow to respond with bradykinesia Psych: Affect labile.  Patient perseverates on the thought that he is in the hospital because his wife is sick despite being told that he is the patient.  He is cooperative with examination Head: Normocephalic and atraumatic, without obvious abnormality EENT: Normal conjunctivae, dry mucous membranes, no OP obstruction LUNGS: Normal respiratory effort. Non-labored breathing on room air CV: Regular rate and rhythm on telemetry ABDOMEN: Soft, non-tender, non-distended Extremities: warm, well perfused, without obvious deformity  NEURO:  Mental Status: Awake,  alert, and oriented to person and place, disoriented to time and situation.  Able to follow simple and 2 step commands. He cannot explain why he is in the hospital and does not recall events of last night Speech/Language: speech is slow with delayed responses.   Naming, repetition, fluency, and comprehension intact without aphasia  No neglect is noted Cranial Nerves:  II: PERRL 1 mm/brisk. visual fields full.  III, IV, VI: EOMI. Lid elevation symmetric and full.  V: Sensation is intact to light touch and symmetrical to face. Blinks to threat.  VII: Face is symmetric resting and smiling.  VIII: Hearing intact to voice IX, X:Phonation normal.  XI: Normal sternocleidomastoid and trapezius muscle strength XII: Tongue protrudes midline without fasciculations.   Motor: 5/5 strength is all muscle groups.  Tone is normal. Bulk is normal.  Sensation: Intact to light touch bilaterally in all four extremities. No extinction to DSS present.  Coordination: FTN intact bilaterally. HKS intact bilaterally. No pronator drift. Alternating hand movements.  DTRs: 2+ triceps, biceps and brachioradialis, 3+ patellar with 4 beats of clonus to right ankle dorsiflexion. Gait: Deferred   Labs I have reviewed labs in epic and the results pertinent to this consultation are:  CBC    Component Value Date/Time   WBC 14.3 (H) 08/23/2021 0449   RBC 4.69 08/23/2021 0449   HGB 13.1 08/23/2021 0449   HCT 40.2 08/23/2021 0449   PLT 226 08/23/2021 0449   MCV 85.7 08/23/2021 0449   MCH 27.9 08/23/2021 0449   MCHC 32.6 08/23/2021 0449   RDW 16.6 (H) 08/23/2021 0449   LYMPHSABS 1.3 08/22/2021 2303   MONOABS 1.0 08/22/2021 2303   EOSABS 0.0 08/22/2021 2303   BASOSABS 0.1 08/22/2021 2303   CMP     Component Value Date/Time   NA 138 08/23/2021 0449   K 4.4 08/23/2021 0449   CL 104 08/23/2021 0449   CO2 26 08/23/2021 0449   GLUCOSE 80 08/23/2021 0449   BUN 15 08/23/2021 0449   CREATININE 1.27 (H) 08/23/2021  0449   CALCIUM 8.8 (L) 08/23/2021 0449   PROT 6.8 08/22/2021 2303   ALBUMIN 3.9 08/22/2021 2303   AST 38 08/22/2021 2303   ALT 24 08/22/2021 2303   ALKPHOS 91 08/22/2021 2303   BILITOT 0.4 08/22/2021 2303   GFRNONAA >60 08/23/2021 0449   GFRAA >60 05/13/2020 0720   Lipid Panel     Component Value Date/Time   CHOL 204 (H) 01/28/2020 0342   TRIG 68 01/28/2020 0342   HDL 36 (L) 01/28/2020 0342   CHOLHDL 5.7 01/28/2020 0342   VLDL 14 01/28/2020 0342   LDLCALC 154 (H) 01/28/2020 0342   Drugs of Abuse     Component Value Date/Time   LABOPIA POSITIVE (A) 08/22/2021 2303   COCAINSCRNUR NONE DETECTED 08/22/2021 2303   LABBENZ NONE DETECTED 08/22/2021 2303   AMPHETMU NONE DETECTED 08/22/2021 2303   THCU NONE DETECTED 08/22/2021 2303   LABBARB NONE DETECTED 08/22/2021 2303  Imaging I have reviewed the images obtained:  CT-scan of the brain- No acute abnormalities  MRI examination of the brain- pending  EEG- no seizures. Generalized slowing.  Assessment: Patient is a 60 year old male with a history of chronic low back pain, opioid use disorder, RCC with right nephrectomy and renal insufficiency.  He was found unresponsive in his vehicle at 2100 yesterday evening.   A friend who had been with him stated that he had ingested some pills but did not specify what type.  He was given narcan, and level of consciousness transiently improved.  He was given ativan for seizure-like activity and was intubated for inability to protect his airway.  He was extubated this morning and continues to have altered mental status with disorientation to time and situation, delayed responses and bradykinesia.  Patient continues to have altered mental status after a presumed overdose of unknown substance.  UDS positive for opiates.  Patient endorses taking muscle relaxant that he does not have a prescription for.  EEG negative for seizure activity.  Will obtain brain MRI to further evaluate for causes of  decreased LOC and re-evaluate tomorrow (specific MRI patterns have been reported in opiate/fentanyl overdoses and MRI may be helpful to ascertain etiology of his current picture)  Recommendations: - Brain MRI - Re-evaluate tomorrow - No need for ongoing AEDs but will need to maintain seizure precautions including no driving for 6 months per [facility].  Detailed seizure precautions below.  SEIZURE PRECAUTIONS Per [facility] statutes, patients with seizures are not allowed to drive until they have been seizure-free for six months.   Use caution when using heavy equipment or power tools. Avoid working on ladders or at heights. Take showers instead of baths. Ensure the water temperature is not too high on the home water heater. Do not go swimming alone. Do not lock yourself in a room alone (i.e. bathroom). When caring for infants or small children, sit down when holding, feeding, or changing them to minimize risk of injury to the child in the event you have a seizure. Maintain good sleep hygiene. Avoid alcohol.    If patient has another seizure, call 911 and bring them back to the ED if: A.  The seizure lasts longer than 5 minutes.      B.  The patient doesn't wake shortly after the seizure or has new problems such as difficulty seeing, speaking or moving following the seizure C.  The patient was injured during the seizure D.  The patient has a temperature over 102 F (39C) E.  The patient vomited during the seizure and now is having trouble breathing    [place] , MSN, AGACNP-BC [facility] See Amion for schedule and pager information 08/23/2021 2:44 PM   Attending Neurohospitalist Addendum Patient seen and examined with APP/Resident. Agree with the history and physical as documented above. Agree with the plan as documented, which I helped formulate. I have independently reviewed the chart, obtained history, review of systems and examined the  patient.I have personally reviewed pertinent head/neck/spine imaging (CT/MRI). Please feel free to call with any questions. Plan discussed with Dr. Tamala Julian.   -- Amie [place], MD Neurologist [facility] Pager: [phone]

## 2021-08-23 NOTE — Plan of Care (Signed)

## 2021-08-23 NOTE — Procedures (Signed)
Patient Name: Joshua Lewis  MRN: [id number]  Epilepsy Attending: Lora Havens  Referring Physician/Provider: Lestine Mount, PA-C Date:08/23/2021 Duration: 23.49 mins  Patient history: 60 year old male with altered mental status and seizure-like activity.  EEG to evaluate for seizure.  Level of alertness: Awake  AEDs during EEG study: None  Technical aspects: This EEG study was done with scalp electrodes positioned according to the 10-20 International system of electrode placement. Electrical activity was acquired at a sampling rate of 500Hz  and reviewed with a high frequency filter of 70Hz  and a low frequency filter of 1Hz . EEG data were recorded continuously and digitally stored.   Description: No clear posterior dominant rhythm was seen the EEG showed continuous generalized predominantly 6 Hz theta slowing admixed with intermittent generalized 2 to 3 Hz delta slowing.  Hyperventilation and photic stimulation were not performed.     ABNORMALITY - Continuous slow, generalized  IMPRESSION: This study is suggestive of moderate diffuse encephalopathy, nonspecific etiology. No seizures or epileptiform discharges were seen throughout the recording.  Joshua Lewis [name]

## 2021-08-23 NOTE — Care Management (Signed)
Admission reported to [place] through portal. Confirmation number [id number]

## 2021-08-23 NOTE — Progress Notes (Signed)
NAME:  Joshua Lewis, MRN:  [id number], DOB:  08/07/1961, LOS: 0 ADMISSION DATE:  08/22/2021, CONSULTATION DATE:  12/13 REFERRING MD:  Roxanne Mins, CHIEF COMPLAINT:  Opioid overdose/unresponsive   History of Present Illness:  Joshua Lewis is a 60 yo male with chronic low back pain, hx of RCC s/p right nephrectomy, and renal insufficiency. He presented to [facility] on 12/12 after being found unresponsive in his car by his family member with unknown period of down time. Per the patient's wife, he had gotten off of work around 1700 on 12/12 and went to run errands afterwards. Around 2100, a friend came to their home and told the wife that the patient was unresponsive in his car, with suspected opioid overdose. EMS was called and patient was given narcan, however, only minimally responsive to this intervention. Upon arrival to the ED, there was then concern for seizure activity and patient was given ativan with no resolution of seizure like activity. Patient was also noted to be vomiting on arrival, with GCS of 6, thus, he was intubated for failure to protect his airway. CT head with no acute intracranial abnormalities.  Pertinent  Medical History  Chronic low back pain RCC s/p R nephrectomy Renal insufficiency  Significant Hospital Events: Including procedures, antibiotic start and stop dates in addition to other pertinent events   12/12 - BIB EMS after found unresponsive in vehicle, likely opiate OD. Unclear downtime. Never pulseless/did not require CPR. Narcan 4mg  IN with some response. Respiratory support with NPA and eventual intubation in ED. C/f seizure-like activity. CT Head NAICA. Unasyn for ?aspiration.  Interim History / Subjective:  Sedated and appears to be resting comfortably. Unable to follow commands at this time.   Objective   Blood pressure 96/64, pulse 64, temperature 98.2 F (36.8 C), temperature source Bladder, resp. rate 16, height 5\' 9"  (1.753 m), weight 67.5 kg, SpO2 100 %.    Vent  Mode: PRVC FiO2 (%):  [40 %-100 %] 40 % Set Rate:  [16 bmp] 16 bmp Vt Set:  [550 mL] 550 mL PEEP:  [5 cmH20] 5 cmH20 Plateau Pressure:  [15 cmH20-19 cmH20] 19 cmH20   Intake/Output Summary (Last 24 hours) at 08/23/2021 0648 Last data filed at 08/23/2021 0600 Gross per 24 hour  Intake 120.18 ml  Output 710 ml  Net -589.82 ml   Filed Weights   08/22/21 2300 08/23/21 0227  Weight: 79.8 kg 67.5 kg    Examination: General: ill appearing male, NAD HENT: [place], AT, pupils ~62mm, sluggishly reactive to light Lungs: Diminished breath sounds, remains on vent.  Cardiovascular: RRR, no murmur Abdomen: Soft, nontender, +BS Extremities: No LE edema Neuro: Sedated, unable to follow commands GU: Foley  Resolved Hospital Problem list   N/A  Assessment & Plan:  Unresponsive/AMS likely in the setting of opioid overdose Seizure like activity UDS positive for opioids. S/p 4 mg intranasal narcan with initial improvement of alertness, followed by decreased responsiveness, increased extremity tone, tremor, and GCS 6. Also noted to have leftward stare, diffuse tremor, and increased extremity tone- attempted 2 mg ativan with no resolution of seizure like activity. Intubated upon arrival.  - Neuro consulted, appreciate recommendations  - EEG - Consider AED load - MRI when patient more stable  Acute hypoxemic respiratory failure in the setting of opioid overdose CXR with trace left pleural effusion. ABG with normal pH and slightly elevated CO2. Vent settings: PRVC RR 16, Tv 550, PEEP 5, FiO2 40% with peak pressure of 25 and plateau of  19.  - Maintain SpO2 >90% - On mechanical ventilation, SBTs as tolerated - Pulmonary hygiene - Wean propofol as able  Possible aspiration pneumonia Leukocytosis  Episode of vomiting on arrival with GCS of 6. WBC 17 on admission, improved to 14 today - Continue unasyn - Continue to trend WBC, LA  AKI Hx of RCC s/p R nephrectomy Baseline Cr within normal limits.  Cr elevated to 1.34 on arrival, 1.27 this morning. - Continue to monitor BMP - Avoid nephrotoxic agents  Hx of opioid use disorder Chronic low back pain UDS positive for opioids on admission.  - Limit opioid analgesics  - Substance abuse counseling when patient more stable   Best Practice (right click and "Reselect all SmartList Selections" daily)   Diet/type: NPO DVT prophylaxis: prophylactic heparin  GI prophylaxis: PPI Lines: N/A Foley:  Yes, and it is still needed Code Status:  full code Last date of multidisciplinary goals of care discussion [Pending]  Labs   CBC: Recent Labs  Lab 08/22/21 2303 08/22/21 2322 08/23/21 0449  WBC 17.1*  --  14.3*  NEUTROABS 14.7*  --   --   HGB 13.8 14.6 13.1  HCT 44.3 43.0 40.2  MCV 90.4  --  85.7  PLT 273  --  539    Basic Metabolic Panel: Recent Labs  Lab 08/22/21 2303 08/22/21 2322 08/23/21 0449  NA 138 137 138  K 4.3 3.8 4.4  CL 102  --  104  CO2 23  --  26  GLUCOSE 233*  --  80  BUN 18  --  15  CREATININE 1.34*  --  1.27*  CALCIUM 8.6*  --  8.8*  MG  --   --  2.0  PHOS  --   --  3.8   GFR: Estimated Creatinine Clearance: 58.3 mL/min (A) (by C-G formula based on SCr of 1.27 mg/dL (H)). Recent Labs  Lab 08/22/21 2303 08/23/21 0449  WBC 17.1* 14.3*    Liver Function Tests: Recent Labs  Lab 08/22/21 2303  AST 38  ALT 24  ALKPHOS 91  BILITOT 0.4  PROT 6.8  ALBUMIN 3.9   No results for input(s): LIPASE, AMYLASE in the last 168 hours. No results for input(s): AMMONIA in the last 168 hours.  ABG    Component Value Date/Time   PHART 7.360 08/22/2021 2322   PCO2ART 53.0 (H) 08/22/2021 2322   PO2ART 553 (H) 08/22/2021 2322   HCO3 29.9 (H) 08/22/2021 2322   TCO2 32 08/22/2021 2322   O2SAT 100.0 08/22/2021 2322     Coagulation Profile: No results for input(s): INR, PROTIME in the last 168 hours.  Cardiac Enzymes: No results for input(s): CKTOTAL, CKMB, CKMBINDEX, TROPONINI in the last 168  hours.  HbA1C: No results found for: HGBA1C  CBG: Recent Labs  Lab 08/23/21 0225  GLUCAP 112*    Review of Systems:     Critical care time: 35 minutes

## 2021-08-23 NOTE — H&P (Signed)
NAME:  Joshua Lewis, MRN:  [id number], DOB:  May 29, 1961, LOS: 0 ADMISSION DATE:  08/22/2021 CONSULTATION DATE:  08/23/2021 REFERRING MD:  Roxanne Mins - EDP CHIEF COMPLAINT:  Unresponsive, possible OD  History of Present Illness:  60 year old man who presented to [facility] ED 12/12 via EMS after being found unresponsive in car by family member with unknown downtime. PMHx significant for chronic low back pain, RCC s/p R nephrectomy, renal insufficiency, GIB, opioid abuse.  History is obtained from patient's wife (at bedside). She states that her husband got off of work at [employer] around 1700 and told her that he was going to go get some plants for outside their residence. It was getting late and she began to worry; around 2100 patient's friend came to her door and told her that patient was not responding but was "starting to come to". He admitted that they had been using pills but would not tell patient's wife what type; stated "he just took too much for him." (Of note, patient previously struggled with opiate use after back surgery). She ran to see patient and found him in the car, unresponsive with snoring respirations. Per patient's wife, he did not lose a pulse/receive CPR. She contacted EMS who treated patient for respiratory arrest. Narcan 4mg  IN was given with initial improvement in alertness but recurrent unresponsiveness with leftward gaze, diffuse tremor and increased tone of extremities.  There was ongoing concern for seizure activity with and Ativan 2mg  was administered without effect. Patient was vomiting on arrival to ED with GCS 6, not protecting his airway. He was subsequently intubated. CT Head was obtained, NAICA.  PCCM was consulted for admission and further management.  Pertinent Medical History:  Chronic low back pain, RCC s/p R nephrectomy, renal insufficiency  Significant Hospital Events: Including procedures, antibiotic start and stop dates in addition to other pertinent events   12/12  - BIB EMS after found unresponsive in vehicle, likely opiate OD. Unclear downtime. Never pulseless/did not require CPR. Narcan 4mg  IN with some response. Respiratory support with NPA and eventual intubation in ED. C/f seizure-like activity. CT Head NAICA. Unasyn for ?aspiration.  Interim History / Subjective:  PCCM consulted for admission/management  Objective:  Blood pressure 129/84, pulse 73, temperature (!) 97 F (36.1 C), temperature source Rectal, resp. rate 18, height 5\' 9"  (1.753 m), weight 79.8 kg, SpO2 100 %.    Vent Mode: PRVC FiO2 (%):  [100 %] 100 % Set Rate:  [16 bmp] 16 bmp Vt Set:  [550 mL] 550 mL PEEP:  [5 cmH20] 5 cmH20 Plateau Pressure:  [15 cmH20] 15 cmH20  No intake or output data in the 24 hours ending 08/23/21 0030 Filed Weights   08/22/21 2300  Weight: 79.8 kg   Physical Examination: General: Acutely ill-appearing middle-aged man in NAD. HEENT: /AT, anicteric sclera, pupils equal round and 63mm, sluggishly reactive to light, dry mucous membranes. Neuro: Comatose. Does not respond to verbal, tactile or noxious stimuli. Does not withdraw to pain.Not following commands. No spontaneous movement of extremities noted on exam. +Corneal, +Cough, and +Gag  CV: RRR, no m/g/r. PULM: Breathing even and unlabored on vent (PEEP 5, FiO2 40%). Lung fields diminished at bilateral bases, no wheeze/rhonchi/rales noted. GI: Soft, nontender, nondistended. Normoactive bowel sounds. Extremities: No LE edema noted. Skin: Warm/dry, no rashes.  Resolved Hospital Problem List:    Assessment & Plan:  Altered mental status/unresponsiveness with concern for opiate overdose Concern for seizure activity BIB EMS after found unresponsive in car after taking  some pills with a friend. Required Narcan 4mg  IN. Initial improvement in alertness followed by decreased responsiveness, leftward gaze deviation, increased extremity tone and generalized tremor. Intubated shortly after arrival to ED. UDS  +opiates. CT Head NAICA. - Admit to ICU for close monitoring - F/u EEG, may need LTM - Consider AED load - MRI when stable/clinically appropriate  - Neuro consult  Acute hypoxemic respiratory failure in the setting of presumed opiate overdose - Continue full vent support (4-8cc/kg IBW) - Wean FiO2 for O2 sat > 90% - Daily WUA/SBT - VAP bundle - Pulmonary hygiene - PAD protocol for sedation: Propofol for goal RASS 0 to -1, limit as able to assess neurologic exam - Follow CXR, ABG  Concern for aspiration pneumonia Leukocytosis Vomiting on arrival to ED with GCS 6 and inability to protect airway. WBC 17 on admission. - Empiric Unasyn for aspiration PNA coverage - Follow CXR - Trend WBC, LA - Follow Cx data  Mild AKI Chronic renal insufficiency s/p R nephrectomy History of RCC History of R nephrectomy for RCC with resultant renal insufficiency. Baseline Cr ~1.1. - Trend BMP - Replete electrolytes as indicated - Monitor I&Os - F/u urine studies - Avoid nephrotoxic agents as able - Ensure adequate renal perfusion  Chronic back pain History of opioid abuse UDS + opiates. - Limit opioid analgesics as able - Prioritize pain control with adjunct agents - Substance abuse counseling when able to participate in care  Best Practice: (right click and "Reselect all SmartList Selections" daily)   Diet/type: NPO w/ meds via tube DVT prophylaxis: prophylactic heparin  GI prophylaxis: PPI Lines: N/A Foley:  Yes, and it is still needed Code Status:  full code Last date of multidisciplinary goals of care discussion [Pending]  Labs:  CBC: Recent Labs  Lab 08/22/21 2303 08/22/21 2322  WBC 17.1*  --   NEUTROABS 14.7*  --   HGB 13.8 14.6  HCT 44.3 43.0  MCV 90.4  --   PLT 273  --    Basic Metabolic Panel: Recent Labs  Lab 08/22/21 2303 08/22/21 2322  NA 138 137  K 4.3 3.8  CL 102  --   CO2 23  --   GLUCOSE 233*  --   BUN 18  --   CREATININE 1.34*  --   CALCIUM 8.6*   --    GFR: Estimated Creatinine Clearance: 57.9 mL/min (A) (by C-G formula based on SCr of 1.34 mg/dL (H)). Recent Labs  Lab 08/22/21 2303  WBC 17.1*   Liver Function Tests: Recent Labs  Lab 08/22/21 2303  AST 38  ALT 24  ALKPHOS 91  BILITOT 0.4  PROT 6.8  ALBUMIN 3.9   No results for input(s): LIPASE, AMYLASE in the last 168 hours. No results for input(s): AMMONIA in the last 168 hours.  ABG:    Component Value Date/Time   PHART 7.360 08/22/2021 2322   PCO2ART 53.0 (H) 08/22/2021 2322   PO2ART 553 (H) 08/22/2021 2322   HCO3 29.9 (H) 08/22/2021 2322   TCO2 32 08/22/2021 2322   O2SAT 100.0 08/22/2021 2322    Coagulation Profile: No results for input(s): INR, PROTIME in the last 168 hours.  Cardiac Enzymes: No results for input(s): CKTOTAL, CKMB, CKMBINDEX, TROPONINI in the last 168 hours.  HbA1C: No results found for: HGBA1C  CBG: No results for input(s): GLUCAP in the last 168 hours.  Review of Systems:   Patient is encephalopathic and/or intubated. Therefore history has been obtained from chart review/wife at  bedside.   Past Medical History:  He,  has a past medical history of Chronic back pain greater than 3 months duration and Kidney carcinoma ([place]).   Surgical History:   Past Surgical History:  Procedure Laterality Date   ESOPHAGOGASTRODUODENOSCOPY (EGD) WITH PROPOFOL Left 08/09/2020   Procedure: ESOPHAGOGASTRODUODENOSCOPY (EGD) WITH PROPOFOL;  Surgeon: Clarene Essex, MD;  Location: [facility] ENDOSCOPY;  Service: Endoscopy;  Laterality: Left;   HAND SURGERY     HERNIA REPAIR     NEPHRECTOMY     right    Social History:   reports that he has been smoking. He has been smoking an average of .5 packs per day. He has never used smokeless tobacco. He reports that he does not drink alcohol and does not use drugs.   Family History:  His family history includes Cancer in his mother; Diabetes in his mother; Hypertension in his mother.   Allergies: No Known  Allergies   Home Medications: Prior to Admission medications   Medication Sig Start Date End Date Taking? Authorizing Provider  pantoprazole (PROTONIX) 40 MG tablet TAKE 1 TABLET (40 MG TOTAL) BY MOUTH TWO TIMES DAILY. Patient not taking: Reported on 08/19/2020 08/10/20 08/10/21  Dagar, Meredith Staggers, MD  Ibuprofen-Diphenhydramine HCl (ADVIL PM) 200-25 MG CAPS Take 1 tablet by mouth at bedtime. To help sleep  11/25/11  [provider]    Critical care time: 40 minutes   Lestine Mount, PA-C [facility] 08/23/21 12:30 AM  Please see [email or web address] for pager details.  From 7A-7P if no response, please call [phone] After hours, please call ELink [phone]

## 2021-08-23 NOTE — Progress Notes (Signed)
Pharmacy Antibiotic Note  Joshua Lewis is a 60 y.o. male admitted on 08/22/2021 with concern for aspiration pneumonia.  Pharmacy has been consulted for Unasyn dosing.  Plan: Unasyn 3g IV Q6H.  Height: 5\' 9"  (175.3 cm) Weight: 79.8 kg (176 lb) (from 2021 records, please update) IBW/kg (Calculated) : 70.7  Temp (24hrs), Avg:97.4 F (36.3 C), Min:97 F (36.1 C), Max:97.7 F (36.5 C)  Recent Labs  Lab 08/22/21 2303  WBC 17.1*  CREATININE 1.34*    Estimated Creatinine Clearance: 57.9 mL/min (A) (by C-G formula based on SCr of 1.34 mg/dL (H)).    No Known Allergies   Thank you for allowing pharmacy to be a part of this patient's care.  Wynona Neat, PharmD, BCPS  08/23/2021 1:33 AM

## 2021-08-23 NOTE — Progress Notes (Signed)
EEG completed, results pending. 

## 2021-08-23 NOTE — Procedures (Signed)
Extubation Procedure Note  Patient Details:   Name: Joshua Lewis DOB: 19-Oct-1960 MRN: [id number]   Airway Documentation:    Vent end date: 08/23/21 Vent end time: 0820   Evaluation  O2 sats: stable throughout Complications: No apparent complications Patient did tolerate procedure well. Bilateral Breath Sounds: Clear, Diminished   Yes,  Per order, pt was extubated to 3L [place]. Prior to extubation, pt did have a positive cuff leak. Pt tolerated well with SVS and no stridor noted. Pt was able to state his name. RT will continue to monitor pt.  Jorje Guild 08/23/2021, 8:28 AM

## 2021-08-24 ENCOUNTER — Inpatient Hospital Stay (HOSPITAL_COMMUNITY): Payer: No Typology Code available for payment source

## 2021-08-24 DIAGNOSIS — T50901A Poisoning by unspecified drugs, medicaments and biological substances, accidental (unintentional), initial encounter: Secondary | ICD-10-CM

## 2021-08-24 LAB — GLUCOSE, CAPILLARY
Glucose-Capillary: 102 mg/dL — ABNORMAL HIGH (ref 70–99)
Glucose-Capillary: 111 mg/dL — ABNORMAL HIGH (ref 70–99)
Glucose-Capillary: 88 mg/dL (ref 70–99)
Glucose-Capillary: 129 mg/dL — ABNORMAL HIGH (ref 70–99)
Glucose-Capillary: 110 mg/dL — ABNORMAL HIGH (ref 70–99)
Glucose-Capillary: 136 mg/dL — ABNORMAL HIGH (ref 70–99)

## 2021-08-24 NOTE — Progress Notes (Signed)
Neurology Progress Note  Subjective: Patient appears to be much more awake and interactive with examiner today but does have some residual confusion and perseveration.   Exam: Vitals:   08/24/21 0352 08/24/21 0800  BP: 124/75 124/81  Pulse: 71 68  Resp: 18 17  Temp: 98.9 F (37.2 C) 98.7 F (37.1 C)  SpO2: 95% 96%   Gen: Laying in hospital bed, in no acute distress.  Resp: non-labored breathing, no respiratory distress on room air Abd: soft, non-tender, non-distended  Neuro: Mental Status: Awake, alert, and oriented to self and month. He incorrectly states that the year is 2021 and that he is 60 years old.  Patient is able to state that he is in the hospital but still believes that he is visiting his sick wife. Does not recall events leading up to hospitalization. When examiner explains his presentation patient endorses that he may have taken his wife's muscle relaxer medications and Advil PM.  Patient perseverates on being concerned about his wife, wanting to leave, and wanting food. He follows commands without difficulty.  Cranial Nerves: PERRL, EOMI, visual fields are full, facial sensation is intact and symmetric to light touch, face is symmetric resting and smiling, hearing is intact to voice, palate rises symmetrically, phonation intact, shoulders shrug symmetrically, tongue is midline Motor: Moves all extremities well with antigravity movement without vertical drift. 5/5 strength throughout. Sensory:Intact and symmetric to light touch throughout DTR: 2+ and symmetric throughout Gait: Deferred  Pertinent Labs: CBC    Component Value Date/Time   WBC 14.3 (H) 08/23/2021 0449   RBC 4.69 08/23/2021 0449   HGB 13.1 08/23/2021 0449   HCT 40.2 08/23/2021 0449   PLT 226 08/23/2021 0449   MCV 85.7 08/23/2021 0449   MCH 27.9 08/23/2021 0449   MCHC 32.6 08/23/2021 0449   RDW 16.6 (H) 08/23/2021 0449   LYMPHSABS 1.3 08/22/2021 2303   MONOABS 1.0 08/22/2021 2303   EOSABS 0.0  08/22/2021 2303   BASOSABS 0.1 08/22/2021 2303   CMP     Component Value Date/Time   NA 138 08/23/2021 0449   K 4.4 08/23/2021 0449   CL 104 08/23/2021 0449   CO2 26 08/23/2021 0449   GLUCOSE 80 08/23/2021 0449   BUN 15 08/23/2021 0449   CREATININE 1.27 (H) 08/23/2021 0449   CALCIUM 8.8 (L) 08/23/2021 0449   PROT 6.8 08/22/2021 2303   ALBUMIN 3.9 08/22/2021 2303   AST 38 08/22/2021 2303   ALT 24 08/22/2021 2303   ALKPHOS 91 08/22/2021 2303   BILITOT 0.4 08/22/2021 2303   GFRNONAA >60 08/23/2021 0449   GFRAA >60 05/13/2020 0720   Imaging Reviewed:  MRI brain wo contrast: Prematurely terminated examination, as described. No evidence of acute intracranial abnormality. Mild chronic small vessel ischemic changes within the cerebral white matter. 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 Mild paranasal sinus disease, as described.   Assessment:  60 y.o. male with PMHx of chronic low back pain, opioid use disorder, RCC with right nephrectomy and renal insufficiency who was was found unresponsive in his vehicle at 2100 on 12/12.  A friend who had been with him stated that he had ingested some pills but did not specify what type.  He was given narcan, and level of consciousness transiently improved. While in the ED, patient was given ativan for seizure-like activity and was intubated for inability to protect his airway. He was extubated this morning and continues to have AMS with disorientation to time and situation and increased latency  of responses.  UDS positive for opiates. Patient endorses taking muscle relaxant and that he does not have a prescription for and Advil PM.  EEG negative for seizure activity.  MRI brain was without acute intracranial abnormality. Patient's mental status and alertness are much improved from examination yesterday.  We will expect slow gradual improvement if there was true overdosing. Electrographic and imaging studies of the brain are  reassuring. No further investigation required from a neurological standpoint.  Recommendations: - No need for ongoing AEDs but will need to maintain seizure precautions including no driving for 6 months per [facility].  Detailed seizure precautions below.   SEIZURE PRECAUTIONS Per [facility] statutes, patients with seizures are not allowed to drive until they have been seizure-free for six months.    Use caution when using heavy equipment or power tools. Avoid working on ladders or at heights. Take showers instead of baths. Ensure the water temperature is not too high on the home water heater. Do not go swimming alone. Do not lock yourself in a room alone (i.e. bathroom). When caring for infants or small children, sit down when holding, feeding, or changing them to minimize risk of injury to the child in the event you have a seizure. Maintain good sleep hygiene. Avoid alcohol.    If patient has another seizure, call 911 and bring them back to the ED if: A.  The seizure lasts longer than 5 minutes.      B.  The patient doesn't wake shortly after the seizure or has new problems such as difficulty seeing, speaking or moving following the seizure C.  The patient was injured during the seizure D.  The patient has a temperature over 102 F (39C) E.  The patient vomited during the seizure and now is having trouble breathing  Anibal Henderson, AGACNP-BC [facility] [phone]   Attending Neurohospitalist Addendum Patient seen and examined with APP/Resident. Agree with the history and physical as documented above. Agree with the plan as documented, which I helped formulate. I have independently reviewed the chart, obtained history, review of systems and examined the patient.I have personally reviewed pertinent head/neck/spine imaging (CT/MRI). Please feel free to call with any questions.  -- Amie Portland, MD Neurologist [facility] Pager:  [phone]

## 2021-08-24 NOTE — Plan of Care (Signed)

## 2021-08-24 NOTE — Progress Notes (Signed)
PROGRESS NOTE    Joshua Lewis  TFT:[id number] DOB: 01/08/1961 DOA: 08/22/2021 PCP: Milana Na., MD   Chief Complaint  Patient presents with   Altered Mental Status    Pt biba from car, unresponsive, unknown down time. Hx of opiod abuse    Brief Narrative:    60 year old man who presented to [facility] ED 12/12 via EMS after being found unresponsive in car by family member with unknown downtime. PMHx significant for chronic low back pain, RCC s/p R nephrectomy, renal insufficiency, GIB, opioid abuse.   History is obtained from patient's wife (at bedside). She states that her husband got off of work at [employer] around 1700 and told her that he was going to go get some plants for outside their residence. It was getting late and she began to worry; around 2100 patient's friend came to her door and told her that patient was not responding but was "starting to come to". He admitted that they had been using pills but would not tell patient's wife what type; stated "he just took too much for him." (Of note, patient previously struggled with opiate use after back surgery). She ran to see patient and found him in the car, unresponsive with snoring respirations. Per patient's wife, he did not lose a pulse/receive CPR. She contacted EMS who treated patient for respiratory arrest. Narcan 64m IN was given with initial improvement in alertness but recurrent unresponsiveness with leftward gaze, diffuse tremor and increased tone of extremities.   There was ongoing concern for seizure activity with and Ativan 243mwas administered without effect. Patient was vomiting on arrival to ED with GCS 6, not protecting his airway. He was subsequently intubated. CT Head was obtained, NAICA. -Sent was successfully extubated yesterday, and [facility].   Assessment & Plan:   Principal Problem:   Altered mental status    Altered mental status/unresponsiveness with concern for opiate  overdose Concern for seizure activity -He did have good response to Narcan initially, but still intubated for airway protection, is currently extubated and able to protect his airways. -Most likely due to opiate use. -Neurology input greatly appreciated, EEG and MRI brain reassuring, no indication for AED, but recommendation    Acute hypoxemic respiratory failure in the setting of presumed opiate overdose -Successfully extubated, wean oxygen as tolerated.  Concern for aspiration pneumonia Leukocytosis -Continue with Unasyn leukocytosis trending down.   Mild AKI Chronic renal insufficiency s/p R nephrectomy History of RCC History of R nephrectomy for RCC with resultant renal insufficiency. Baseline Cr ~1.1. -Continue to trend BMP and avoid nephrotoxic medications.  Chronic back pain History of opioid abuse UDS + opiates. - Limit opioid analgesics as able - Prioritize pain control with adjunct agents - Substance abuse counseling when able to participate in care patient does not appear to be on any narcotics on PDMP  DVT prophylaxis: Heparin Code Status: Full Family Communication: None at bedside Disposition:   Status is: Inpatient  Remains inpatient appropriate because: Remains altered, mentation not back to baseline       Consultants:  PCCM Neurology   Subjective:  No significant events overnight as discussed with staff, patient report he is hungry this morning and asking for food  Objective: Vitals:   08/24/21 0002 08/24/21 0352 08/24/21 0500 08/24/21 0800  BP: 116/73 124/75  124/81  Pulse: 74 71  68  Resp: _0 Temp: 98.7 F (37.1 C) 98.9 F (37.2 C)  98.7 F (37.1  C)  TempSrc: Oral Oral  Oral  SpO2: 93% 95%  96%  Weight:   68.1 kg   Height:        Intake/Output Summary (Last 24 hours) at 08/24/2021 1203 Last data filed at 08/24/2021 1055 Gross per 24 hour  Intake 579.31 ml  Output 500 ml  Net 79.31 ml   Filed Weights   08/22/21 2300  08/23/21 0227 08/24/21 0500  Weight: 79.8 kg 67.5 kg 68.1 kg    Examination:  Awake Alert, Oriented X 2, he is confused, but communicative, follow all commands. Symmetrical Chest wall movement, Good air movement bilaterally, CTAB RRR,No Gallops,Rubs or new Murmurs, No Parasternal Heave +ve B.Sounds, Abd Soft, No tenderness, No rebound - guarding or rigidity. No Cyanosis, Clubbing or edema, No new Rash or bruise      Data Reviewed: I have personally reviewed following labs and imaging studies  CBC: Recent Labs  Lab 08/22/21 2303 08/22/21 2322 08/23/21 0449  WBC 17.1*  --  14.3*  NEUTROABS 14.7*  --   --   HGB 13.8 14.6 13.1  HCT 44.3 43.0 40.2  MCV 90.4  --  85.7  PLT 273  --  253    Basic Metabolic Panel: Recent Labs  Lab 08/22/21 2303 08/22/21 2322 08/23/21 0449  NA 138 137 138  K 4.3 3.8 4.4  CL 102  --  104  CO2 23  --  26  GLUCOSE 233*  --  80  BUN 18  --  15  CREATININE 1.34*  --  1.27*  CALCIUM 8.6*  --  8.8*  MG  --   --  2.0  PHOS  --   --  3.8    GFR: Estimated Creatinine Clearance: 58.8 mL/min (A) (by C-G formula based on SCr of 1.27 mg/dL (H)).  Liver Function Tests: Recent Labs  Lab 08/22/21 2303  AST 38  ALT 24  ALKPHOS 91  BILITOT 0.4  PROT 6.8  ALBUMIN 3.9    CBG: Recent Labs  Lab 08/23/21 1117 08/23/21 1935 08/23/21 2351 08/24/21 0358 08/24/21 0820  GLUCAP 113* 107* 111* 102* 88     Recent Results (from the past 240 hour(s))  Resp Panel by RT-PCR (Flu A&B, Covid) Nasopharyngeal Swab     Status: None   Collection Time: 08/23/21 12:09 AM   Specimen: Nasopharyngeal Swab; Nasopharyngeal(NP) swabs in vial transport medium  Result Value Ref Range Status   SARS Coronavirus 2 by RT PCR NEGATIVE NEGATIVE Final    Comment: (NOTE) SARS-CoV-2 target nucleic acids are NOT DETECTED.  The SARS-CoV-2 RNA is generally detectable in upper respiratory specimens during the acute phase of infection. The lowest concentration of  SARS-CoV-2 viral copies this assay can detect is 138 copies/mL. A negative result does not preclude SARS-Cov-2 infection and should not be used as the sole basis for treatment or other patient management decisions. A negative result may occur with  improper specimen collection/handling, submission of specimen other than nasopharyngeal swab, presence of viral mutation(s) within the areas targeted by this assay, and inadequate number of viral copies(<138 copies/mL). A negative result must be combined with clinical observations, patient history, and epidemiological information. The expected result is Negative.  Fact Sheet for Patients:  EntrepreneurPulse.com.au  Fact Sheet for Healthcare Providers:  IncredibleEmployment.be  This test is no t yet approved or cleared by the [place] FDA and  has been authorized for detection and/or diagnosis of SARS-CoV-2 by FDA under an Emergency Use Authorization (EUA). This EUA will  remain  in effect (meaning this test can be used) for the duration of the COVID-19 declaration under Section 564(b)(1) of the Act, 21 U.S.C.section 360bbb-3(b)(1), unless the authorization is terminated  or revoked sooner.       Influenza A by PCR NEGATIVE NEGATIVE Final   Influenza B by PCR NEGATIVE NEGATIVE Final    Comment: (NOTE) The Xpert Xpress SARS-CoV-2/FLU/RSV plus assay is intended as an aid in the diagnosis of influenza from Nasopharyngeal swab specimens and should not be used as a sole basis for treatment. Nasal washings and aspirates are unacceptable for Xpert Xpress SARS-CoV-2/FLU/RSV testing.  Fact Sheet for Patients: EntrepreneurPulse.com.au  Fact Sheet for Healthcare Providers: IncredibleEmployment.be  This test is not yet approved or cleared by the [place] FDA and has been authorized for detection and/or diagnosis of SARS-CoV-2 by FDA under an Emergency Use  Authorization (EUA). This EUA will remain in effect (meaning this test can be used) for the duration of the COVID-19 declaration under Section 564(b)(1) of the Act, 21 U.S.C. section 360bbb-3(b)(1), unless the authorization is terminated or revoked.  Performed at [facility], [place] [address], [place], [place] [address]   MRSA Next Gen by PCR, Nasal     Status: None   Collection Time: 08/23/21  2:26 AM   Specimen: Nasal Mucosa; Nasal Swab  Result Value Ref Range Status   MRSA by PCR Next Gen NOT DETECTED NOT DETECTED Final    Comment: (NOTE) The GeneXpert MRSA Assay (FDA approved for NASAL specimens only), is one component of a comprehensive MRSA colonization surveillance program. It is not intended to diagnose MRSA infection nor to guide or monitor treatment for MRSA infections. Test performance is not FDA approved in patients less than 21 years old. Performed at [facility], [place] [address] [place],  [address]          Radiology Studies: CT Head Wo Contrast  Result Date: 08/22/2021 CLINICAL DATA:  Mental status change unresponsive EXAM: CT HEAD WITHOUT CONTRAST TECHNIQUE: Contiguous axial images were obtained from the base of the skull through the vertex without intravenous contrast. COMPARISON:  CT brain 07/14/2013 FINDINGS: Brain: No acute territorial infarction, hemorrhage or intracranial mass. The ventricles are nonenlarged. Vascular: No hyperdense vessels.  No unexpected calcification. Skull: Normal. Negative for fracture or focal lesion. Sinuses/Orbits: Mucosal thickening in the sinuses. Fluid and debris within the posterior nasopharynx. Other: None. IMPRESSION: Negative non contrasted CT appearance of the brain Electronically Signed   By: Donavan Foil M.D.   On: 08/22/2021 23:56   MR BRAIN WO CONTRAST  Result Date: 08/23/2021 CLINICAL DATA:  Provided history: Mental status change, unknown cause. EXAM: MRI HEAD WITHOUT CONTRAST TECHNIQUE: Multiplanar,  multiecho pulse sequences of the brain and surrounding structures were obtained without intravenous contrast. COMPARISON:  Prior head CT examinations 08/22/2021 and earlier. FINDINGS: Brain: Due to altered mental status, the patient was unable to tolerate the full examination. As a result, only diffusion-weighted imaging, a sagittal T1 weighted sequence, an axial T2 TSE sequence, an axial T2 FLAIR sequence and an axial SWI sequence could be obtained. Cerebral volume appears normal for age. Mild ill-defined T2 FLAIR hyperintense signal abnormality within the bilateral periatrial white matter, nonspecific but compatible with chronic small vessel ischemic disease. 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 There is no acute infarct. No evidence of an intracranial mass. No chronic intracranial blood products. No extra-axial fluid collection. No midline shift. Vascular: Maintained flow voids within the proximal large  arterial vessels. Skull and upper cervical spine: No focal suspicious marrow lesion. Sinuses/Orbits: Visualized orbits show no acute finding. Mild mucosal thickening within the left ethmoid sinuses. Small left maxillary sinus mucous retention cyst. IMPRESSION: Prematurely terminated examination, as described. No evidence of acute intracranial abnormality. Mild chronic small vessel ischemic changes within the cerebral white matter. 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 Mild paranasal sinus disease, as described. Electronically Signed   By: Kellie Simmering D.O.   On: 08/23/2021 17:45   DG Chest Port 1 View  Result Date: 08/24/2021 CLINICAL DATA:  Difficulty breathing EXAM: PORTABLE CHEST 1 VIEW COMPARISON:  Previous studies including the examination of 08/22/2021 FINDINGS: There is interval removal of endotracheal tube and enteric tube. Cardiac size is within normal limits. Low position of diaphragms suggests possible COPD. Lung fields are clear of any infiltrates or  pulmonary edema. There is no pleural effusion or pneumothorax. IMPRESSION: There are no signs of pulmonary edema or new focal infiltrates. COPD. Electronically Signed   By: Elmer Picker M.D.   On: 08/24/2021 08:35   DG Chest Portable 1 View  Result Date: 08/22/2021 CLINICAL DATA:  Post intubation EXAM: PORTABLE CHEST 1 VIEW COMPARISON:  05/13/2020 FINDINGS: Endotracheal tube terminates 5 cm above the carina. Enteric tube courses into the stomach. Lungs are essentially clear. Possible trace left pleural effusion. No pneumothorax. The heart is normal in size. IMPRESSION: Endotracheal tube terminates 5 cm above the carina. Enteric tube courses into the stomach. Possible trace left pleural effusion. Electronically Signed   By: Julian Hy M.D.   On: 08/22/2021 23:24   EEG adult  Result Date: 08/23/2021 Lora Havens, MD     08/23/2021 10:56 AM Patient Name: Joshua Lewis MRN: [id number] Epilepsy Attending: Lora Havens Referring Physician/Provider: Lestine Mount, PA-C Date:08/23/2021 Duration: 23.49 mins Patient history: 60 year old male with altered mental status and seizure-like activity.  EEG to evaluate for seizure. Level of alertness: Awake AEDs during EEG study: None Technical aspects: This EEG study was done with scalp electrodes positioned according to the 10-20 International system of electrode placement. Electrical activity was acquired at a sampling rate of _0  and reviewed with a high frequency filter of _1  and a low frequency filter of _2 . EEG data were recorded continuously and digitally stored. Description: No clear posterior dominant rhythm was seen the EEG showed continuous generalized predominantly 6 Hz theta slowing admixed with intermittent generalized 2 to 3 Hz delta slowing.  Hyperventilation and photic stimulation were not performed.   ABNORMALITY - Continuous slow, generalized IMPRESSION: This study is suggestive of moderate diffuse encephalopathy, nonspecific  etiology. No seizures or epileptiform discharges were seen throughout the recording. Priyanka [name]        Scheduled Meds:  chlorhexidine gluconate (MEDLINE KIT)  15 mL Mouth Rinse BID   Chlorhexidine Gluconate Cloth  6 each Topical Daily   heparin  5,000 Units Subcutaneous Q8H   mouth rinse  15 mL Mouth Rinse 10 times per day   Continuous Infusions:  ampicillin-sulbactam (UNASYN) IV 3 g (08/24/21 0930)     LOS: 1 day       Phillips Climes, MD [facility]   To contact the attending provider between 7A-7P or the covering provider during after hours 7P-7A, please log into the web site [email or web address] and access using universal [place] password for that web site. If you do not have the password, please call the hospital operator.  08/24/2021, 12:03 PM

## 2021-08-24 NOTE — Evaluation (Signed)
Occupational Therapy Evaluation Patient Details Name: Joshua Lewis MRN: [id number] DOB: Feb 25, 1961 Today's Date: 08/24/2021   History of Present Illness 60 yo admitted 12/12 after found unresponsive in his car by family member with possible OD. Intubated 12/12-12/13. Head CT negative, EEG with encephalopathy. PMHx; LBP, renal CA s/p nephrectomy, renal insufficiency   Clinical Impression   Pt typically independent in ADL and mobility. 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 Today Pt is pleasantly confused, persevarating on "I can't figure out why I am here and what happened". Min vc for problem solving functional tasks. Able to don socks without assist EOB, min A to ambulate to sink, min guard for sink level grooming (able to open all containers etc), and enjoyed hallway ambulation at min A. Multiple small LOB during dynamic movement requiring min A from therapist with gait belt. Much improved from previous session yesterday with PT. OT will continue to follow acutely and at this time recommending outpatient therapy for cognition (either OT or SLP) to maximize safety and independence in ADL and functional transfers.     Recommendations for follow up therapy are one component of a multi-disciplinary discharge planning process, led by the attending physician.  Recommendations may be updated based on patient status, additional functional criteria and insurance authorization.   Follow Up Recommendations  Outpatient OT (for cognition, or SLP)    Assistance Recommended at Discharge Frequent or constant Supervision/Assistance (will potentially improve and need less supervision)  Functional Status Assessment  Patient has had a recent decline in their functional status and demonstrates the ability to make significant improvements in function in a reasonable and predictable amount of time.  Equipment Recommendations  None recommended by OT    Recommendations for Other Services PT  consult;Speech consult (cognition)     Precautions / Restrictions Precautions Precautions: Fall Restrictions Weight Bearing Restrictions: No      Mobility Bed Mobility Overal bed mobility: Needs Assistance Bed Mobility: Supine to Sit;Sit to Supine     Supine to sit: Min guard;HOB elevated Sit to supine: Min guard   General bed mobility comments: guarding for lines and safety with HOB 20 degrees.    Transfers Overall transfer level: Needs assistance   Transfers: Sit to/from Stand Sit to Stand: Min guard           General transfer comment: min guard for line management      Balance Overall balance assessment: Needs assistance Sitting-balance support: No upper extremity supported;Feet supported Sitting balance-Leahy Scale: Fair     Standing balance support: No upper extremity supported Standing balance-Leahy Scale: Fair Standing balance comment: min guard static standing, min A for dynamic standing tasks                           ADL either performed or assessed with clinical judgement   ADL Overall ADL's : Needs assistance/impaired Eating/Feeding: Modified independent;Sitting   Grooming: Wash/dry hands;Wash/dry face;Oral care;Min guard;Standing Grooming Details (indicate cue type and reason): sink level Upper Body Bathing: Min guard;Standing   Lower Body Bathing: Min guard;Sitting/lateral leans   Upper Body Dressing : Set up   Lower Body Dressing: Set up;Sitting/lateral leans Lower Body Dressing Details (indicate cue type and reason): to don socks EOB, no cues needed today and donned both socks on both feet Toilet Transfer: Minimal assistance;Ambulation;Regular Toilet;Grab bars Toilet Transfer Details (indicate cue type and reason): slight LOB requiring therapist assist to correct with gait belt x3 Toileting-  Clothing Manipulation and Hygiene: Min guard;Sit to/from stand       Functional mobility during ADLs: Minimal assistance (gait belt  essential) General ADL Comments: decreased cognition and balance     Vision Baseline Vision/History: 1 Wears glasses Ability to See in Adequate Light: 0 Adequate Patient Visual Report: No change from baseline Vision Assessment?: Vision impaired- to be further tested in functional context Additional Comments: was walking into items in the hallway on his left. continue to evaluate     Perception     Praxis      Pertinent Vitals/Pain Pain Assessment: No/denies pain Facial Expression: Relaxed, neutral Pain Intervention(s): Monitored during session     Hand Dominance Right   Extremity/Trunk Assessment Upper Extremity Assessment Upper Extremity Assessment: Generalized weakness   Lower Extremity Assessment Lower Extremity Assessment: Defer to PT evaluation   Cervical / Trunk Assessment Cervical / Trunk Assessment: Normal   Communication Communication Communication: No difficulties   Cognition Arousal/Alertness: Lethargic Behavior During Therapy: WFL for tasks assessed/performed (very pleasant) Overall Cognitive Status: Impaired/Different from baseline Area of Impairment: Memory;Following commands;Attention;Safety/judgement;Awareness;Problem solving;Orientation                 Orientation Level: Disoriented to;Situation ("i don't know what happened") Current Attention Level: Sustained Memory: Decreased short-term memory Following Commands: Follows one step commands consistently;Follows multi-step commands inconsistently Safety/Judgement: Decreased awareness of deficits;Decreased awareness of safety Awareness: Emergent Problem Solving: Requires verbal cues;Difficulty sequencing General Comments: Pt with improved cognition from previous PT evaluation. perseverating on "I don't know what happened and how I got here" min verbal cues for problem solving functional tasks and managing lines. Pt with multiple LOB in standing- steadied by OT but decreased awareness of deficits  and safety     General Comments  VSS throughout session    Exercises     Shoulder Instructions      Home Living Family/patient expects to be discharged to:: Private residence Living Arrangements: Spouse/significant other Available Help at Discharge: Family;Available 24 hours/day Type of Home: House Home Access: Stairs to enter [employer] of Steps: 5 Entrance Stairs-Rails: Right;Left;Can reach both Home Layout: Multi-level;Bed/bath upstairs Alternate Level Stairs-Number of Steps: 7   Bathroom Shower/Tub: Teacher, early years/pre: Standard     Home Equipment: Conservation officer, nature (2 wheels)   Additional Comments: 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, drives      Prior Functioning/Environment Prior Level of Function : Independent/Modified Independent                        OT Problem List: Decreased activity tolerance;Impaired balance (sitting and/or standing);Decreased cognition;Decreased safety awareness      OT Treatment/Interventions: Self-care/ADL training;Therapeutic activities;Cognitive remediation/compensation;Patient/family education;Balance training    OT Goals(Current goals can be found in the care plan section) Acute Rehab OT Goals Patient Stated Goal: get better to get back to work OT Goal Formulation: With patient Time For Goal Achievement: 09/07/21 Potential to Achieve Goals: Good ADL Goals Pt Will Perform Grooming: with modified independence;standing Pt Will Perform Upper Body Dressing: with modified independence;standing Pt Will Perform Lower Body Dressing: with modified independence;sit to/from stand Pt Will Transfer to Toilet: with modified independence;ambulating;regular height toilet Pt Will Perform Toileting - Clothing Manipulation and hygiene: with modified independence;sit to/from stand Additional ADL Goal #1: Pt will demonstrate increased STM by recalling 3 words from beginning of session at the end  OT  Frequency: Min 2X/week   Barriers to D/C:  Co-evaluation              AM-PAC OT "6 Clicks" Daily Activity     Outcome Measure Help from another person eating meals?: None Help from another person taking care of personal grooming?: A Little Help from another person toileting, which includes using toliet, bedpan, or urinal?: A Little Help from another person bathing (including washing, rinsing, drying)?: A Little Help from another person to put on and taking off regular upper body clothing?: None Help from another person to put on and taking off regular lower body clothing?: A Little 6 Click Score: 20   End of Session Equipment Utilized During Treatment: Gait belt Nurse Communication: Mobility status;Precautions  Activity Tolerance: Patient tolerated treatment well Patient left: in bed;with call bell/phone within reach;with nursing/sitter in room  OT Visit Diagnosis: Unsteadiness on feet (R26.81);Muscle weakness (generalized) (M62.81);Other symptoms and signs involving cognitive function                Time: 6010-9323 OT Time Calculation (min): 27 min Charges:  OT General Charges $OT Visit: 1 Visit OT Evaluation $OT Eval Low Complexity: 1 Low OT Treatments $Self Care/Home Management : 8-22 mins  Jesse Sans OTR/L Acute Rehabilitation Services Pager: [phone] Office: [place] 08/24/2021, 11:33 AM

## 2021-08-25 LAB — CBC
HCT: 37.3 % — ABNORMAL LOW (ref 39.0–52.0)
Hemoglobin: 12.1 g/dL — ABNORMAL LOW (ref 13.0–17.0)
MCH: 27.9 pg (ref 26.0–34.0)
MCHC: 32.4 g/dL (ref 30.0–36.0)
MCV: 86.1 fL (ref 80.0–100.0)
Platelets: 217 10*3/uL (ref 150–400)
RBC: 4.33 MIL/uL (ref 4.22–5.81)
RDW: 16.5 % — ABNORMAL HIGH (ref 11.5–15.5)
WBC: 8.6 10*3/uL (ref 4.0–10.5)
nRBC: 0 % (ref 0.0–0.2)

## 2021-08-25 LAB — BASIC METABOLIC PANEL
Anion gap: 5 (ref 5–15)
BUN: 12 mg/dL (ref 8–23)
CO2: 25 mmol/L (ref 22–32)
Calcium: 8.5 mg/dL — ABNORMAL LOW (ref 8.9–10.3)
Chloride: 106 mmol/L (ref 98–111)
Creatinine, Ser: 1.03 mg/dL (ref 0.61–1.24)
GFR, Estimated: >60 mL/min (ref >60)
Glucose, Bld: 103 mg/dL — ABNORMAL HIGH (ref 70–99)
Potassium: 4 mmol/L (ref 3.5–5.1)
Sodium: 136 mmol/L (ref 135–145)

## 2021-08-25 LAB — GLUCOSE, CAPILLARY
Glucose-Capillary: 111 mg/dL — ABNORMAL HIGH (ref 70–99)
Glucose-Capillary: 154 mg/dL — ABNORMAL HIGH (ref 70–99)

## 2021-08-25 LAB — PHOSPHORUS: Phosphorus: 2.4 mg/dL — ABNORMAL LOW (ref 2.5–4.6)

## 2021-08-25 LAB — MAGNESIUM: Magnesium: 2 mg/dL (ref 1.7–2.4)

## 2021-08-25 NOTE — Progress Notes (Signed)
Discharge paperwork reviewed with pt. Pt verbalized understanding. Awaiting Melburn Popper to transport pt home around 1600.

## 2021-08-25 NOTE — Discharge Instructions (Signed)
SEIZURE PRECAUTIONS °Per [place] DMV statutes, patients with seizures are not allowed to drive until they have been seizure-free for six months. °  °Use caution when using heavy equipment or power tools. Avoid working on ladders or at heights. Take showers instead of baths. Ensure the water temperature is not too high on the home water heater. Do not go swimming alone. Do not lock yourself in a room alone (i.e. bathroom). When caring for infants or small children, sit down when holding, feeding, or changing them to minimize risk of injury to the child in the event you have a seizure. Maintain good sleep hygiene. Avoid alcohol. °  °If patient has another seizure, call 911 and bring them back to the ED if: °A.  The seizure lasts longer than 5 minutes.      °B.  The patient doesn't wake shortly after the seizure or has new problems such as difficulty seeing, speaking or moving following the seizure °C.  The patient was injured during the seizure °D.  The patient has a temperature over 102 F (39C) °E.  The patient vomited during the seizure and now is having trouble breathing ° ° °

## 2021-08-25 NOTE — TOC Initial Note (Addendum)
Transition of Care [facility]) - Initial/Assessment Note    Patient Details  Name: Joshua Lewis MRN: [id number] Date of Birth: 1961/03/16  Transition of Care [facility]) CM/SW Contact:    Joshua Bender, RN Phone Number: 08/25/2021, 8:42 AM  Clinical Narrative:           Spoke to wife about transition need. Patient has only [employer]. I spoke to Joshua Lewis who is [facility] [place] CSW and she states the patient doesn't have PCP with the [place] because he was last visit was 01/2011. Joshua Lewis is going to call me back today once she checks with her manager on how to proceed to get [name] orders through the [place]. Wife is also requesting walker and 3&1. DME form faxed to the [place].  900-Spoke with Joshua Lewis and she is going to call the wife. The patient will have to go to the [place] to be seen by a doctor before the [place] will approve Home Health or DME.  TOC will continue to follow  Expected Discharge Plan: [name] Barriers to Discharge: Continued Medical Work up   Patient Goals and CMS Choice   CMS Medicare.gov Compare Post Acute Care list provided to:: Patient Represenative (must comment) (wife) Choice offered to / list presented to : Spouse  Expected Discharge Plan and Services Expected Discharge Plan: [name]   Discharge Planning Services: CM Consult Post Acute Care Choice: Durable Medical Equipment, Home Health Living arrangements for the past 2 months: Apartment                                      Prior Living Arrangements/Services Living arrangements for the past 2 months: Apartment Lives with:: Spouse Patient language and need for interpreter reviewed:: Yes Do you feel safe going back to the place where you live?: Yes      Need for Family Participation in Patient Care: Yes (Comment) Care giver support system in place?: Yes (comment)   Criminal Activity/Legal Involvement Pertinent to Current Situation/Hospitalization: No - Comment as needed  Activities  of Daily Living      Permission Sought/Granted Permission sought to share information with : [facility] administrator granted to share info w AGENCY: home health        Emotional Assessment         Alcohol / Substance Use: Other (comment) Psych Involvement: No (comment)  Admission diagnosis:  Altered mental status [R41.82] Altered mental status, unspecified altered mental status type [R41.82] Patient Active Problem List   Diagnosis Date Noted   Altered mental status 08/23/2021   GI bleed 08/08/2020   MDD (major depressive disorder), severe (Joshua Lewis) 01/28/2020   Opiate dependence ([place]) 01/28/2020   PCP:  Puschinsky, Fransico Him., MD Pharmacy:   [facility] [place] [address] [place] [address] Phone: [phone] Fax: [place] [address] [place] [address] Phone: [phone] Fax: [place], [place] [facility] [address] [place] [address] Phone: [phone] Fax: [phone]     Social Determinants of Health (SDOH) Interventions    Readmission Risk Interventions No flowsheet data found.

## 2021-08-25 NOTE — Progress Notes (Signed)
Physical Therapy Treatment Patient Details Name: Joshua Lewis MRN: [id number] DOB: [birth year] Today's Date: 08/25/2021   History of Present Illness 60 yo admitted 12/12 after found unresponsive in his car by family member with possible OD. Intubated 12/12-12/13. Head CT negative, EEG with encephalopathy. PMHx; LBP, renal CA s/p nephrectomy, renal insufficiency    PT Comments    Pt moving well with continued cognitive deficits but significantly improved from last session. Pt remains with deficits in orientation, STM, simple money management but able to demonstrate improved gait, and function with long hall ambulation and stairs this session. Pt continues to require supervision at D/C for cognition but no longer requires HHPT or DME.   Recommendations for follow up therapy are one component of a multi-disciplinary discharge planning process, led by the attending physician.  Recommendations may be updated based on patient status, additional functional criteria and insurance authorization.  Follow Up Recommendations  No PT follow up     Assistance Recommended at Discharge Frequent or constant Supervision/Assistance  Equipment Recommendations  None recommended by PT    Recommendations for Other Services       Precautions / Restrictions Precautions Precautions: Other (comment) Precaution Comments: supervision for cognition     Mobility  Bed Mobility Overal bed mobility: Modified Independent                  Transfers Overall transfer level: Modified independent                      Ambulation/Gait Ambulation/Gait assistance: Supervision Gait Distance (Feet): 550 Feet Assistive device: None Gait Pattern/deviations: WFL(Within Functional Limits)   Gait velocity interpretation: >2.62 ft/sec, indicative of community ambulatory   General Gait Details: pt with steady gait without AD and able to locate room with environmental reminders but not room number,  supervision for IV and safety due to cognition   Stairs Stairs: Yes Stairs assistance: Modified independent (Device/Increase time) Stair Management: One rail Right;Alternating pattern;Forwards Number of Stairs: 11 General stair comments: good stability with use of rail   Wheelchair Mobility    Modified [name] (Stroke Patients Only)       Balance Overall balance assessment: No apparent balance deficits (not formally assessed)                                          Cognition Arousal/Alertness: Awake/alert Behavior During Therapy: WFL for tasks assessed/performed Overall Cognitive Status: Impaired/Different from baseline Area of Impairment: Orientation;Memory;Safety/judgement                 Orientation Level: Disoriented to;Time;Situation Current Attention Level: Selective Memory: Decreased short-term memory Following Commands: Follows one step commands consistently;Follows multi-step commands inconsistently Safety/Judgement: Decreased awareness of deficits;Decreased awareness of safety Awareness: Emergent   General Comments: pt unable to recall situation, does not recall day of the week even after education, unable to recall room number but could accurately find it        Exercises      General Comments        Pertinent Vitals/Pain Pain Assessment: No/denies pain    Home Living                          Prior Function            PT Goals (current goals can now be found in  the care plan section) Progress towards PT goals: Progressing toward goals    Frequency    Min 3X/week      PT Plan Discharge plan needs to be updated    Co-evaluation              AM-PAC PT "6 Clicks" Mobility   Outcome Measure  Help needed turning from your back to your side while in a flat bed without using bedrails?: None Help needed moving from lying on your back to sitting on the side of a flat bed without using bedrails?:  None Help needed moving to and from a bed to a chair (including a wheelchair)?: A Little Help needed standing up from a chair using your arms (e.g., wheelchair or bedside chair)?: A Little Help needed to walk in hospital room?: A Little Help needed climbing 3-5 steps with a railing? : A Little 6 Click Score: 20    End of Session Equipment Utilized During Treatment: Gait belt Activity Tolerance: Patient tolerated treatment well Patient left: in chair;with call bell/phone within reach;with nursing/sitter in room;with chair alarm set Nurse Communication: Mobility status PT Visit Diagnosis: Other abnormalities of gait and mobility (R26.89)     Time: 9326-7124 PT Time Calculation (min) (ACUTE ONLY): 23 min  Charges:  $Gait Training: 8-22 mins $Therapeutic Activity: 8-22 mins                     Khadeejah Castner P, PT Acute Rehabilitation Services Pager: [phone] Office: [phone]    Letisha Yera B Jamil Castillo 08/25/2021, 1:34 PM

## 2021-08-25 NOTE — Discharge Summary (Signed)
Physician Discharge Summary  Joshua Lewis KNL:[id number] DOB: 04-Jan-1961 DOA: 08/22/2021  PCP: Milana Na., MD  Admit date: 08/22/2021 Discharge date: 08/25/2021  Admitted From: Home Disposition:  Home   Recommendations for Outpatient Follow-up:  Follow up with PCP in 1-2 weeks Please obtain BMP/CBC in one week  Please continue counseling about substance abuse  Home Health:YES   Discharge Condition:Stable CODE STATUS:FULL Diet recommendation: Heart Healthy Brief/Interim Summary:  Altered mental status/unresponsiveness with concern for opiate overdose Concern for seizure activity -He did have good response to Narcan initially, but still intubated for airway protection, is currently extubated and able to protect his airways.  Most likely his presentation is secondary to opiates abuse, he does endorse he took some pills from a friend, he was counseled. -Neurology input greatly appreciated, EEG and MRI brain reassuring, no indication for AED, patient was informed about seizure precautions and no driving for 49-month.  Acute hypoxemic respiratory failure in the setting of presumed opiate overdose -Successfully extubated, wean oxygen as tolerated. 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 Baseline Cr ~1.1.    Chronic back pain History of opioid abuse UDS + opiates. - Limit opioid analgesics as able - Substance abuse counseling when able to participate in care patient does not appear to be on any narcotics on PDMP  Discharge Diagnoses:  Principal Problem:   Altered mental status    Discharge Instructions  Discharge Instructions     Diet - low sodium heart healthy   Complete by: As directed    Discharge instructions    Complete by: As directed    [place] [facility] statutes, patients with seizures are not allowed to drive until they have been seizure-free for six months.    Use caution when using heavy equipment or power tools. Avoid working on ladders or at heights. Take showers instead of baths. Ensure the water temperature is not too high on the home water heater. Do not go swimming alone. Do not lock yourself in a room alone (i.e. bathroom). When caring for infants or small children, sit down when holding, feeding, or changing them to minimize risk of injury to the child in the event you have a seizure. Maintain good sleep hygiene. Avoid alcohol.    If patient has another seizure, call 911 and bring them back to the ED if: A.  The seizure lasts longer than 5 minutes.      B.  The patient doesn't wake shortly after the seizure or has new problems such as difficulty seeing, speaking or moving following the seizure C.  The patient was injured during the seizure D.  The patient has a temperature over 102 F (39C) E.  The patient vomited during the seizure and now is having trouble breathing   Increase activity slowly   Complete by: As directed       Allergies as of 08/25/2021   No Known Allergies      Medication List     STOP taking these medications    pantoprazole 40 MG tablet Commonly known as: PROTONIX        Follow-up Information     Puschinsky, Fransico Him., MD Follow up.   Specialty: General Surgery Contact information: [address] [place] [address] [phone]  No Known Allergies  Consultations: PCCM Neurology   Procedures/Studies: CT Head Wo Contrast  Result Date: 08/22/2021 CLINICAL DATA:  Mental status change unresponsive EXAM: CT HEAD WITHOUT CONTRAST TECHNIQUE: Contiguous axial images were obtained from the base of the skull through the vertex without intravenous contrast. COMPARISON:  CT brain  07/14/2013 FINDINGS: Brain: No acute territorial infarction, hemorrhage or intracranial mass. The ventricles are nonenlarged. Vascular: No hyperdense vessels.  No unexpected calcification. Skull: Normal. Negative for fracture or focal lesion. Sinuses/Orbits: Mucosal thickening in the sinuses. Fluid and debris within the posterior nasopharynx. Other: None. IMPRESSION: Negative non contrasted CT appearance of the brain Electronically Signed   By: Donavan Foil M.D.   On: 08/22/2021 23:56   MR BRAIN WO CONTRAST  Result Date: 08/23/2021 CLINICAL DATA:  Provided history: Mental status change, unknown cause. EXAM: MRI HEAD WITHOUT CONTRAST TECHNIQUE: Multiplanar, multiecho pulse sequences of the brain and surrounding structures were obtained without intravenous contrast. COMPARISON:  Prior head CT examinations 08/22/2021 and earlier. FINDINGS: Brain: Due to altered mental status, the patient was unable to tolerate the full examination. As a result, only diffusion-weighted imaging, a sagittal T1 weighted sequence, an axial T2 TSE sequence, an axial T2 FLAIR sequence and an axial SWI sequence could be obtained. Cerebral volume appears normal for age. Mild ill-defined T2 FLAIR hyperintense signal abnormality within the bilateral periatrial white matter, nonspecific but compatible with chronic small vessel ischemic disease. 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 There is no acute infarct. No evidence of an intracranial mass. No chronic intracranial blood products. No extra-axial fluid collection. No midline shift. Vascular: Maintained flow voids within the proximal large arterial vessels. Skull and upper cervical spine: No focal suspicious marrow lesion. Sinuses/Orbits: Visualized orbits show no acute finding. Mild mucosal thickening within the left ethmoid sinuses. Small left maxillary sinus mucous retention cyst. IMPRESSION: Prematurely terminated examination, as described. No evidence of acute  intracranial abnormality. Mild chronic small vessel ischemic changes within the cerebral white matter. 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 Mild paranasal sinus disease, as described. Electronically Signed   By: Kellie Simmering D.O.   On: 08/23/2021 17:45   DG Chest Port 1 View  Result Date: 08/24/2021 CLINICAL DATA:  Difficulty breathing EXAM: PORTABLE CHEST 1 VIEW COMPARISON:  Previous studies including the examination of 08/22/2021 FINDINGS: There is interval removal of endotracheal tube and enteric tube. Cardiac size is within normal limits. Low position of diaphragms suggests possible COPD. Lung fields are clear of any infiltrates or pulmonary edema. There is no pleural effusion or pneumothorax. IMPRESSION: There are no signs of pulmonary edema or new focal infiltrates. COPD. Electronically Signed   By: Elmer Picker M.D.   On: 08/24/2021 08:35   DG Chest Portable 1 View  Result Date: 08/22/2021 CLINICAL DATA:  Post intubation EXAM: PORTABLE CHEST 1 VIEW COMPARISON:  05/13/2020 FINDINGS: Endotracheal tube terminates 5 cm above the carina. Enteric tube courses into the stomach. Lungs are essentially clear. Possible trace left pleural effusion. No pneumothorax. The heart is normal in size. IMPRESSION: Endotracheal tube terminates 5 cm above the carina. Enteric tube courses into the stomach. Possible trace left pleural effusion. Electronically Signed   By: Julian Hy M.D.   On: 08/22/2021 23:24   EEG adult  Result Date: 08/23/2021 Lora Havens, MD     08/23/2021 10:56 AM Patient Name: Maycol Hoying MRN: [id number] Epilepsy Attending: Lora Havens Referring Physician/Provider: Lestine Mount, PA-C Date:08/23/2021 Duration: 23.49 mins Patient history:  60 year old male with altered mental status and seizure-like activity.  EEG to evaluate for seizure. Level of alertness: Awake AEDs during EEG study: None Technical aspects: This EEG study was done with scalp  electrodes positioned according to the 10-20 International system of electrode placement. Electrical activity was acquired at a sampling rate of 500Hz  and reviewed with a high frequency filter of 70Hz  and a low frequency filter of 1Hz . EEG data were recorded continuously and digitally stored. Description: No clear posterior dominant rhythm was seen the EEG showed continuous generalized predominantly 6 Hz theta slowing admixed with intermittent generalized 2 to 3 Hz delta slowing.  Hyperventilation and photic stimulation were not performed.   ABNORMALITY - Continuous slow, generalized IMPRESSION: This study is suggestive of moderate diffuse encephalopathy, nonspecific etiology. No seizures or epileptiform discharges were seen throughout the recording. Priyanka [name]      Subjective:  He denies any complaints today, he is eager to go home.  Discharge Exam: Vitals:   08/25/21 0400 08/25/21 1005  BP: 110/82 124/72  Pulse: 60 67  Resp: 18 18  Temp: 98.7 F (37.1 C) 98 F (36.7 C)  SpO2: 98% 98%   Vitals:   08/25/21 0332 08/25/21 0400 08/25/21 0500 08/25/21 1005  BP: 132/88 110/82  124/72  Pulse: 69 60  67  Resp: 17 18  18   Temp: 98.9 F (37.2 C) 98.7 F (37.1 C)  98 F (36.7 C)  TempSrc: Oral Oral  Oral  SpO2: 100% 98%  98%  Weight:   67.3 kg   Height:        General: Pt is alert, awake, not in acute distress Cardiovascular: RRR, S1/S2 +, no rubs, no gallops Respiratory: CTA bilaterally, no wheezing, no rhonchi Abdominal: Soft, NT, ND, bowel sounds + Extremities: no edema, no cyanosis    The results of significant diagnostics from this hospitalization (including imaging, microbiology, ancillary and laboratory) are listed below for reference.     Microbiology: Recent Results (from the past 240 hour(s))  Resp Panel by RT-PCR (Flu A&B, Covid) Nasopharyngeal Swab     Status: None   Collection Time: 08/23/21 12:09 AM   Specimen: Nasopharyngeal Swab; Nasopharyngeal(NP) swabs  in vial transport medium  Result Value Ref Range Status   SARS Coronavirus 2 by RT PCR NEGATIVE NEGATIVE Final    Comment: (NOTE) SARS-CoV-2 target nucleic acids are NOT DETECTED.  The SARS-CoV-2 RNA is generally detectable in upper respiratory specimens during the acute phase of infection. The lowest concentration of SARS-CoV-2 viral copies this assay can detect is 138 copies/mL. A negative result does not preclude SARS-Cov-2 infection and should not be used as the sole basis for treatment or other patient management decisions. A negative result may occur with  improper specimen collection/handling, submission of specimen other than nasopharyngeal swab, presence of viral mutation(s) within the areas targeted by this assay, and inadequate number of viral copies(<138 copies/mL). A negative result must be combined with clinical observations, patient history, and epidemiological information. The expected result is Negative.  Fact Sheet for Patients:  EntrepreneurPulse.com.au  Fact Sheet for Healthcare Providers:  IncredibleEmployment.be  This test is no t yet approved or cleared by the [place] FDA and  has been authorized for detection and/or diagnosis of SARS-CoV-2 by FDA under an Emergency Use Authorization (EUA). This EUA will remain  in effect (meaning this test can be used) for the duration of the COVID-19 declaration under Section 564(b)(1) of the Act, 21 U.S.C.section 360bbb-3(b)(1), unless the authorization is terminated  or revoked sooner.       Influenza A by PCR NEGATIVE NEGATIVE Final   Influenza B by PCR NEGATIVE NEGATIVE Final    Comment: (NOTE) The Xpert Xpress SARS-CoV-2/FLU/RSV plus assay is intended as an aid in the diagnosis of influenza from Nasopharyngeal swab specimens and should not be used as a sole basis for treatment. Nasal washings and aspirates are unacceptable for Xpert Xpress  SARS-CoV-2/FLU/RSV testing.  Fact Sheet for Patients: EntrepreneurPulse.com.au  Fact Sheet for Healthcare Providers: IncredibleEmployment.be  This test is not yet approved or cleared by the [place] FDA and has been authorized for detection and/or diagnosis of SARS-CoV-2 by FDA under an Emergency Use Authorization (EUA). This EUA will remain in effect (meaning this test can be used) for the duration of the COVID-19 declaration under Section 564(b)(1) of the Act, 21 U.S.C. section 360bbb-3(b)(1), unless the authorization is terminated or revoked.  Performed at [facility], [place] [address] [place], [place] [address]   MRSA Next Gen by PCR, Nasal     Status: None   Collection Time: 08/23/21  2:26 AM   Specimen: Nasal Mucosa; Nasal Swab  Result Value Ref Range Status   MRSA by PCR Next Gen NOT DETECTED NOT DETECTED Final    Comment: (NOTE) The GeneXpert MRSA Assay (FDA approved for NASAL specimens only), is one component of a comprehensive MRSA colonization surveillance program. It is not intended to diagnose MRSA infection nor to guide or monitor treatment for MRSA infections. Test performance is not FDA approved in patients less than 85 years old. Performed at [facility], [place] [address] [place], [place] [address]      Labs: BNP (last 3 results) No results for input(s): BNP in the last 8760 hours. Basic Metabolic Panel: Recent Labs  Lab 08/22/21 2303 08/22/21 2322 08/23/21 0449 08/25/21 0138  NA 138 137 138 136  K 4.3 3.8 4.4 4.0  CL 102  --  104 106  CO2 23  --  26 25  GLUCOSE 233*  --  80 103*  BUN 18  --  15 12  CREATININE 1.34*  --  1.27* 1.03  CALCIUM 8.6*  --  8.8* 8.5*  MG  --   --  2.0 2.0  PHOS  --   --  3.8 2.4*   Liver Function Tests: Recent Labs  Lab 08/22/21 2303  AST 38  ALT 24  ALKPHOS 91  BILITOT 0.4  PROT 6.8  ALBUMIN 3.9   No results for input(s): LIPASE, AMYLASE in the last 168  hours. No results for input(s): AMMONIA in the last 168 hours. CBC: Recent Labs  Lab 08/22/21 2303 08/22/21 2322 08/23/21 0449 08/25/21 0138  WBC 17.1*  --  14.3* 8.6  NEUTROABS 14.7*  --   --   --   HGB 13.8 14.6 13.1 12.1*  HCT 44.3 43.0 40.2 37.3*  MCV 90.4  --  85.7 86.1  PLT 273  --  226 217   Cardiac Enzymes: No results for input(s): CKTOTAL, CKMB, CKMBINDEX, TROPONINI in the last 168 hours. BNP: Invalid input(s): POCBNP CBG: Recent Labs  Lab 08/24/21 1233 08/24/21 1602 08/24/21 1941 08/25/21 0037 08/25/21 0335  GLUCAP 129* 110* 136* 154* 111*   D-Dimer No results for input(s): DDIMER in the last 72 hours. Hgb A1c No results for input(s): HGBA1C in the last 72 hours. Lipid Profile No results for input(s): CHOL, HDL, LDLCALC, TRIG, CHOLHDL, LDLDIRECT in the last 72 hours. Thyroid function studies  No results for input(s): TSH, T4TOTAL, T3FREE, THYROIDAB in the last 72 hours.  Invalid input(s): FREET3 Anemia work up No results for input(s): VITAMINB12, FOLATE, FERRITIN, TIBC, IRON, RETICCTPCT in the last 72 hours. Urinalysis    Component Value Date/Time   COLORURINE YELLOW 08/08/2020 1338   APPEARANCEUR HAZY (A) 08/08/2020 1338   LABSPEC 1.019 08/08/2020 1338   PHURINE 5.0 08/08/2020 1338   GLUCOSEU NEGATIVE 08/08/2020 1338   HGBUR MODERATE (A) 08/08/2020 1338   BILIRUBINUR NEGATIVE 08/08/2020 1338   KETONESUR NEGATIVE 08/08/2020 1338   PROTEINUR 30 (A) 08/08/2020 1338   UROBILINOGEN 0.2 11/14/2013 0943   NITRITE NEGATIVE 08/08/2020 1338   LEUKOCYTESUR NEGATIVE 08/08/2020 1338   Sepsis Labs Invalid input(s): PROCALCITONIN,  WBC,  LACTICIDVEN Microbiology Recent Results (from the past 240 hour(s))  Resp Panel by RT-PCR (Flu A&B, Covid) Nasopharyngeal Swab     Status: None   Collection Time: 08/23/21 12:09 AM   Specimen: Nasopharyngeal Swab; Nasopharyngeal(NP) swabs in vial transport medium  Result Value Ref Range Status   SARS Coronavirus 2 by RT  PCR NEGATIVE NEGATIVE Final    Comment: (NOTE) SARS-CoV-2 target nucleic acids are NOT DETECTED.  The SARS-CoV-2 RNA is generally detectable in upper respiratory specimens during the acute phase of infection. The lowest concentration of SARS-CoV-2 viral copies this assay can detect is 138 copies/mL. A negative result does not preclude SARS-Cov-2 infection and should not be used as the sole basis for treatment or other patient management decisions. A negative result may occur with  improper specimen collection/handling, submission of specimen other than nasopharyngeal swab, presence of viral mutation(s) within the areas targeted by this assay, and inadequate number of viral copies(<138 copies/mL). A negative result must be combined with clinical observations, patient history, and epidemiological information. The expected result is Negative.  Fact Sheet for Patients:  EntrepreneurPulse.com.au  Fact Sheet for Healthcare Providers:  IncredibleEmployment.be  This test is no t yet approved or cleared by the [place] FDA and  has been authorized for detection and/or diagnosis of SARS-CoV-2 by FDA under an Emergency Use Authorization (EUA). This EUA will remain  in effect (meaning this test can be used) for the duration of the COVID-19 declaration under Section 564(b)(1) of the Act, 21 U.S.C.section 360bbb-3(b)(1), unless the authorization is terminated  or revoked sooner.       Influenza A by PCR NEGATIVE NEGATIVE Final   Influenza B by PCR NEGATIVE NEGATIVE Final    Comment: (NOTE) The Xpert Xpress SARS-CoV-2/FLU/RSV plus assay is intended as an aid in the diagnosis of influenza from Nasopharyngeal swab specimens and should not be used as a sole basis for treatment. Nasal washings and aspirates are unacceptable for Xpert Xpress SARS-CoV-2/FLU/RSV testing.  Fact Sheet for Patients: EntrepreneurPulse.com.au  Fact Sheet for  Healthcare Providers: IncredibleEmployment.be  This test is not yet approved or cleared by the [place] FDA and has been authorized for detection and/or diagnosis of SARS-CoV-2 by FDA under an Emergency Use Authorization (EUA). This EUA will remain in effect (meaning this test can be used) for the duration of the COVID-19 declaration under Section 564(b)(1) of the Act, 21 U.S.C. section 360bbb-3(b)(1), unless the authorization is terminated or revoked.  Performed at [facility], [place] [address], [place], [place] [address]   MRSA Next Gen by PCR, Nasal     Status: None   Collection Time: 08/23/21  2:26 AM   Specimen: Nasal Mucosa; Nasal Swab  Result Value Ref Range Status   MRSA by  PCR Next Gen NOT DETECTED NOT DETECTED Final    Comment: (NOTE) The GeneXpert MRSA Assay (FDA approved for NASAL specimens only), is one component of a comprehensive MRSA colonization surveillance program. It is not intended to diagnose MRSA infection nor to guide or monitor treatment for MRSA infections. Test performance is not FDA approved in patients less than 74 years old. Performed at [facility], [place] [address], [place], [place] [address]      Time coordinating discharge: Over 30 minutes  SIGNED:   Phillips Climes, MD  [facility] 08/25/2021, 12:50 PM Pager   If 7PM-7AM, please contact night-coverage [email or web address] Password [email or web address]

## 2021-08-25 NOTE — TOC Transition Note (Signed)
Transition of Care [facility]) - CM/SW Discharge Note   Patient Details  Name: Joshua Lewis MRN: [id number] Date of Birth: 09-Aug-1961  [facility] of [facility]) CM/SW Contact:  Cyndi Bender, RN Phone Number: 08/25/2021, 1:25 PM   Clinical Narrative:     Patient stable for discharge. Orders for HH-PT/OT and DME walker and 3&1 Spoke to [facility] with [facility] and DME will be sent to the room prior to discharge. Spoke to Amy with [facility] and referral accepted. Spoke to wife and told her she needs to take patient to the [place] to register him for benefits. Information placed on AVS. I will call [facility] once patient is ready for discharge.   Final next level of care: [place] Barriers to Discharge: Barriers Resolved   Patient Goals and CMS Choice Patient states their goals for this hospitalization and ongoing recovery are:: return home CMS Medicare.gov Compare Post Acute Care list provided to:: Patient Represenative (must comment) (wife) Choice offered to / list presented to : Spouse  Discharge Placement                   home    Discharge Plan and Services   Discharge Planning Services: CM Consult Post Acute Care Choice: Durable Medical Equipment, Home Health          DME Arranged: 3-N-1, Walker rolling DME Agency: [facility] Date DME Agency Contacted: 08/25/21 Time DME Agency Contacted: [id number] Representative spoke with at DME Agency: [place]: PT, OT HH Agency: [place] Date [place]: 08/25/21 Time [place]: [id number] Representative spoke with at [place]: [place] ([place]) Interventions     Readmission Risk Interventions No flowsheet data found.

## 2021-08-25 NOTE — Progress Notes (Signed)
Pt alert and oriented x4 in no acute distress upon discharge. Pt has taken all his belongings including medical equipment with him. Pt wheeled down to main entrance by RN. [employer] awaiting at main entrance to transport pt home.

## 2021-10-31 ENCOUNTER — Other Ambulatory Visit: Payer: Self-pay

## 2022-12-29 ENCOUNTER — Other Ambulatory Visit (HOSPITAL_COMMUNITY): Payer: Self-pay

## 2023-01-03 ENCOUNTER — Other Ambulatory Visit: Payer: Self-pay

## 2023-02-16 ENCOUNTER — Emergency Department (HOSPITAL_COMMUNITY)
Admission: EM | Admit: 2023-02-16 | Discharge: 2023-02-17 | Disposition: A | Discharge status: Other Acute Inpatient Hospital | Payer: No Typology Code available for payment source | Attending: Emergency Medicine | Admitting: Emergency Medicine

## 2023-02-16 ENCOUNTER — Other Ambulatory Visit: Payer: Self-pay

## 2023-02-16 ENCOUNTER — Encounter (HOSPITAL_COMMUNITY): Payer: Self-pay | Admitting: Emergency Medicine

## 2023-02-16 DIAGNOSIS — S31119A Laceration without foreign body of abdominal wall, unspecified quadrant without penetration into peritoneal cavity, initial encounter: Secondary | ICD-10-CM

## 2023-02-16 DIAGNOSIS — Z79899 Other long term (current) drug therapy: Secondary | ICD-10-CM | POA: Diagnosis not present

## 2023-02-16 DIAGNOSIS — R45851 Suicidal ideations: Secondary | ICD-10-CM | POA: Insufficient documentation

## 2023-02-16 DIAGNOSIS — X789XXA Intentional self-harm by unspecified sharp object, initial encounter: Secondary | ICD-10-CM | POA: Insufficient documentation

## 2023-02-16 DIAGNOSIS — F29 Unspecified psychosis not due to a substance or known physiological condition: Secondary | ICD-10-CM | POA: Insufficient documentation

## 2023-02-16 DIAGNOSIS — S3991XA Unspecified injury of abdomen, initial encounter: Secondary | ICD-10-CM | POA: Diagnosis present

## 2023-02-16 LAB — ACETAMINOPHEN LEVEL: Acetaminophen (Tylenol), Serum: <10 ug/mL — ABNORMAL LOW (ref 10–30)

## 2023-02-16 LAB — COMPREHENSIVE METABOLIC PANEL
ALT: 11 U/L (ref 0–44)
AST: 15 U/L (ref 15–41)
Albumin: 3.5 g/dL (ref 3.5–5.0)
Alkaline Phosphatase: 91 U/L (ref 38–126)
Anion gap: 10 (ref 5–15)
BUN: 10 mg/dL (ref 8–23)
CO2: 21 mmol/L — ABNORMAL LOW (ref 22–32)
Calcium: 8.8 mg/dL — ABNORMAL LOW (ref 8.9–10.3)
Chloride: 107 mmol/L (ref 98–111)
Creatinine, Ser: 1.09 mg/dL (ref 0.61–1.24)
GFR, Estimated: >60 mL/min (ref >60)
Glucose, Bld: 90 mg/dL (ref 70–99)
Potassium: 4.2 mmol/L (ref 3.5–5.1)
Sodium: 138 mmol/L (ref 135–145)
Total Bilirubin: 0.5 mg/dL (ref 0.3–1.2)
Total Protein: 6.6 g/dL (ref 6.5–8.1)

## 2023-02-16 LAB — CBC
HCT: 43.8 % (ref 39.0–52.0)
Hemoglobin: 14 g/dL (ref 13.0–17.0)
MCH: 28.3 pg (ref 26.0–34.0)
MCHC: 32 g/dL (ref 30.0–36.0)
MCV: 88.5 fL (ref 80.0–100.0)
Platelets: 326 10*3/uL (ref 150–400)
RBC: 4.95 MIL/uL (ref 4.22–5.81)
RDW: 14.3 % (ref 11.5–15.5)
WBC: 8.9 10*3/uL (ref 4.0–10.5)
nRBC: 0 % (ref 0.0–0.2)

## 2023-02-16 LAB — RAPID URINE DRUG SCREEN, HOSP PERFORMED
Amphetamines: NOT DETECTED
Barbiturates: NOT DETECTED
Benzodiazepines: NOT DETECTED
Cocaine: POSITIVE — AB
Opiates: NOT DETECTED
Tetrahydrocannabinol: POSITIVE — AB

## 2023-02-16 LAB — ETHANOL: Alcohol, Ethyl (B): <10 mg/dL (ref <10)

## 2023-02-16 LAB — SALICYLATE LEVEL: Salicylate Lvl: <7 mg/dL — ABNORMAL LOW (ref 7.0–30.0)

## 2023-02-16 MED ORDER — ACETAMINOPHEN 325 MG PO TABS: 650.0000 mg | ORAL_TABLET | ORAL | Status: DC | PRN

## 2023-02-16 MED ORDER — ALUM & MAG HYDROXIDE-SIMETH 200-200-20 MG/5ML PO SUSP: 30.0000 mL | Freq: Four times a day (QID) | ORAL | Status: DC | PRN

## 2023-02-16 NOTE — BH Assessment (Signed)
This consult has been deferred to [place]. IRIS coordinator will notify TTS once a provider is chosen for assessment.IRIS coordinator will also inform TTS with the scheduled time for assessment.

## 2023-02-16 NOTE — ED Notes (Signed)
Pt wanded by security, pt resting comfortably.

## 2023-02-16 NOTE — ED Triage Notes (Signed)
Patient tearful in triage stating he is tried of hurting himself. Pt raises his shirt and shows me multiple healed lacerations to abdomen where he a few nights ago cut himself several times with a razor blade. Patient's current plan for suicide is to ask the police to shoot him. Prescribed medication for depression through the [place] but does not take it. States his family has left him "because they think I'm crazy."

## 2023-02-16 NOTE — ED Notes (Signed)
Pt changed into scrubs, belongings placed into locker 7. Pt made comfortable.

## 2023-02-16 NOTE — ED Notes (Signed)
Pt is voluntary. 

## 2023-02-16 NOTE — ED Provider Notes (Signed)
[place] EMERGENCY DEPARTMENT AT [facility] Provider Note   CSN: [id number] Arrival date & time: 02/16/23  1805     History  Chief Complaint  Patient presents with   Suicidal    Joshua Lewis is a 62 y.o. male.  Patient complains of suicidal thoughts.  Patient reports he cut his abdomen multiple times this past week.  Patient reports he plans to kill himself.  Patient reports that he does not have any family.  He is followed by the [place].  He reports he is not currently seeing a counselor or a psychiatrist.  Patient told the nurse that he plans to kill himself by getting a police officers to shoot him.  The history is provided by the patient. No language interpreter was used.       Home Medications Prior to Admission medications   Medication Sig Start Date End Date Taking? Authorizing Provider  Ibuprofen-Diphenhydramine HCl (ADVIL PM) 200-25 MG CAPS Take 1 tablet by mouth at bedtime. To help sleep  11/25/11  [provider]      Allergies    Patient has no known allergies.    Review of Systems   Review of Systems  Psychiatric/Behavioral:  Positive for suicidal ideas.   All other systems reviewed and are negative.   Physical Exam Updated Vital Signs BP (!) 146/98 (BP Location: Right Arm)   Pulse 90   Temp 98.5 F (36.9 C) (Oral)   Resp (!) 24   Ht 5\' 9"  (1.753 m)   Wt 67.3 kg   SpO2 100%   BMI 21.91 kg/m  Physical Exam Vitals and nursing note reviewed.  Constitutional:      Appearance: He is well-developed.  HENT:     Head: Normocephalic.     Mouth/Throat:     Mouth: Mucous membranes are moist.  Eyes:     Extraocular Movements: Extraocular movements intact.     Pupils: Pupils are equal, round, and reactive to light.  Cardiovascular:     Rate and Rhythm: Normal rate.  Pulmonary:     Effort: Pulmonary effort is normal.  Abdominal:     General: There is no distension.     Comments: Total superficial lacerations abdomen appear to be from  multiple dates.  Musculoskeletal:        General: Normal range of motion.     Cervical back: Normal range of motion.  Neurological:     General: No focal deficit present.     Mental Status: He is alert and oriented to person, place, and time.  Psychiatric:        Mood and Affect: Mood normal.     ED Results / Procedures / Treatments   Labs (all labs ordered are listed, but only abnormal results are displayed) Labs Reviewed  COMPREHENSIVE METABOLIC PANEL - Abnormal; Notable for the following components:      Result Value   CO2 21 (*)    Calcium 8.8 (*)    All other components within normal limits  SALICYLATE LEVEL - Abnormal; Notable for the following components:   Salicylate Lvl <7.0 (*)    All other components within normal limits  ACETAMINOPHEN LEVEL - Abnormal; Notable for the following components:   Acetaminophen (Tylenol), Serum <10 (*)    All other components within normal limits  RAPID URINE DRUG SCREEN, HOSP PERFORMED - Abnormal; Notable for the following components:   Cocaine POSITIVE (*)    Tetrahydrocannabinol POSITIVE (*)    All other components within  normal limits  ETHANOL  CBC    EKG None  Radiology No results found.  Procedures Procedures    Medications Ordered in ED Medications - No data to display  ED Course/ Medical Decision Making/ A&P                             Medical Decision Making Patient complains of suicidal thoughts.  He is requesting inpatient treatment.  He is voluntary.  Amount and/or Complexity of Data Reviewed Labs: ordered. Decision-making details documented in ED Course.    Details: Labs ordered reviewed and interpreted.  Patient is positive for cocaine and marijuana.  Risk Risk Details: He has consulted for evaluation.  Patient is voluntarily requesting treatment.           Final Clinical Impression(s) / ED Diagnoses Final diagnoses:  Suicidal ideation  Laceration of abdomen, initial encounter    Rx / DC  Orders ED Discharge Orders     None         Osie Cheeks 02/16/23 2103    Charlynne Pander, MD 02/17/23 1451

## 2023-02-17 ENCOUNTER — Encounter: Payer: Self-pay | Admitting: Nurse Practitioner

## 2023-02-17 ENCOUNTER — Inpatient Hospital Stay
Admission: AD | Admit: 2023-02-17 | Discharge: 2023-03-05 | DRG: 885 | Disposition: A | Discharge status: Home / Self Care | Payer: No Typology Code available for payment source | Source: Intra-hospital | Attending: Psychiatry | Admitting: Psychiatry

## 2023-02-17 DIAGNOSIS — R41843 Psychomotor deficit: Secondary | ICD-10-CM | POA: Diagnosis present

## 2023-02-17 DIAGNOSIS — Z5901 Sheltered homelessness: Secondary | ICD-10-CM | POA: Diagnosis not present

## 2023-02-17 DIAGNOSIS — Z5982 Transportation insecurity: Secondary | ICD-10-CM

## 2023-02-17 DIAGNOSIS — F1190 Opioid use, unspecified, uncomplicated: Secondary | ICD-10-CM | POA: Diagnosis present

## 2023-02-17 DIAGNOSIS — F332 Major depressive disorder, recurrent severe without psychotic features: Secondary | ICD-10-CM | POA: Diagnosis present

## 2023-02-17 DIAGNOSIS — F1590 Other stimulant use, unspecified, uncomplicated: Secondary | ICD-10-CM | POA: Diagnosis present

## 2023-02-17 DIAGNOSIS — Z91199 Patient's noncompliance with other medical treatment and regimen due to unspecified reason: Secondary | ICD-10-CM | POA: Diagnosis not present

## 2023-02-17 DIAGNOSIS — Z85528 Personal history of other malignant neoplasm of kidney: Secondary | ICD-10-CM

## 2023-02-17 DIAGNOSIS — F17210 Nicotine dependence, cigarettes, uncomplicated: Secondary | ICD-10-CM | POA: Diagnosis present

## 2023-02-17 DIAGNOSIS — Z833 Family history of diabetes mellitus: Secondary | ICD-10-CM

## 2023-02-17 DIAGNOSIS — Z8249 Family history of ischemic heart disease and other diseases of the circulatory system: Secondary | ICD-10-CM | POA: Diagnosis not present

## 2023-02-17 DIAGNOSIS — G4700 Insomnia, unspecified: Secondary | ICD-10-CM | POA: Diagnosis present

## 2023-02-17 DIAGNOSIS — F419 Anxiety disorder, unspecified: Secondary | ICD-10-CM | POA: Diagnosis present

## 2023-02-17 DIAGNOSIS — Z79899 Other long term (current) drug therapy: Secondary | ICD-10-CM | POA: Diagnosis not present

## 2023-02-17 DIAGNOSIS — Z9151 Personal history of suicidal behavior: Secondary | ICD-10-CM | POA: Diagnosis not present

## 2023-02-17 DIAGNOSIS — F1424 Cocaine dependence with cocaine-induced mood disorder: Secondary | ICD-10-CM | POA: Diagnosis present

## 2023-02-17 DIAGNOSIS — Z905 Acquired absence of kidney: Secondary | ICD-10-CM

## 2023-02-17 DIAGNOSIS — R440 Auditory hallucinations: Secondary | ICD-10-CM | POA: Diagnosis present

## 2023-02-17 DIAGNOSIS — M549 Dorsalgia, unspecified: Secondary | ICD-10-CM | POA: Diagnosis present

## 2023-02-17 DIAGNOSIS — Z716 Tobacco abuse counseling: Secondary | ICD-10-CM

## 2023-02-17 DIAGNOSIS — F333 Major depressive disorder, recurrent, severe with psychotic symptoms: Secondary | ICD-10-CM

## 2023-02-17 DIAGNOSIS — F329 Major depressive disorder, single episode, unspecified: Principal | ICD-10-CM | POA: Diagnosis present

## 2023-02-17 DIAGNOSIS — Z5941 Food insecurity: Secondary | ICD-10-CM | POA: Diagnosis not present

## 2023-02-17 DIAGNOSIS — R45851 Suicidal ideations: Secondary | ICD-10-CM | POA: Diagnosis present

## 2023-02-17 DIAGNOSIS — G8929 Other chronic pain: Secondary | ICD-10-CM | POA: Diagnosis present

## 2023-02-17 DIAGNOSIS — G2581 Restless legs syndrome: Secondary | ICD-10-CM | POA: Diagnosis present

## 2023-02-17 DIAGNOSIS — F1414 Cocaine abuse with cocaine-induced mood disorder: Secondary | ICD-10-CM | POA: Insufficient documentation

## 2023-02-17 DIAGNOSIS — M545 Low back pain, unspecified: Secondary | ICD-10-CM | POA: Diagnosis present

## 2023-02-17 MED ORDER — MAGNESIUM HYDROXIDE 400 MG/5ML PO SUSP: 30.0000 mL | Freq: Every day | ORAL | Status: DC | PRN

## 2023-02-17 MED ORDER — ALUM & MAG HYDROXIDE-SIMETH 200-200-20 MG/5ML PO SUSP: 30.0000 mL | Freq: Four times a day (QID) | ORAL | Status: DC | PRN

## 2023-02-17 MED ORDER — DIPHENHYDRAMINE HCL 50 MG/ML IJ SOLN: 50.0000 mg | Freq: Three times a day (TID) | INTRAMUSCULAR | Status: DC | PRN

## 2023-02-17 MED ORDER — OLANZAPINE 5 MG PO TBDP: 5.0000 mg | ORAL_TABLET | Freq: Three times a day (TID) | ORAL | Status: DC | PRN

## 2023-02-17 MED ORDER — DIPHENHYDRAMINE HCL 25 MG PO CAPS
50.0000 mg | ORAL_CAPSULE | Freq: Three times a day (TID) | ORAL | Status: DC | PRN
  Administered 2023-03-03: 50 mg via ORAL
  Filled 2023-02-17: qty 2

## 2023-02-17 MED ORDER — QUETIAPINE FUMARATE ER 50 MG PO TB24
50.0000 mg | ORAL_TABLET | Freq: Every day | ORAL | Status: DC
  Filled 2023-02-17: qty 1

## 2023-02-17 MED ORDER — QUETIAPINE FUMARATE ER 50 MG PO TB24
50.0000 mg | ORAL_TABLET | Freq: Every day | ORAL | Status: DC
  Administered 2023-02-17 – 2023-02-21 (×4): 50 mg via ORAL
  Filled 2023-02-17 (×5): qty 1

## 2023-02-17 MED ORDER — HYDROXYZINE HCL 25 MG PO TABS
25.0000 mg | ORAL_TABLET | Freq: Three times a day (TID) | ORAL | Status: DC | PRN
  Administered 2023-02-21 – 2023-02-28 (×3): 25 mg via ORAL
  Filled 2023-02-17 (×3): qty 1

## 2023-02-17 MED ORDER — NICOTINE 14 MG/24HR TD PT24
14.0000 mg | MEDICATED_PATCH | Freq: Every day | TRANSDERMAL | Status: DC
  Administered 2023-02-19 – 2023-03-05 (×15): 14 mg via TRANSDERMAL
  Filled 2023-02-17 (×15): qty 1

## 2023-02-17 MED ORDER — ACETAMINOPHEN 325 MG PO TABS
650.0000 mg | ORAL_TABLET | ORAL | Status: DC | PRN
  Administered 2023-02-22: 650 mg via ORAL
  Filled 2023-02-17: qty 2

## 2023-02-17 MED ORDER — HYDROXYZINE HCL 25 MG PO TABS: 25.0000 mg | ORAL_TABLET | Freq: Three times a day (TID) | ORAL | Status: DC | PRN

## 2023-02-17 MED ORDER — HALOPERIDOL LACTATE 5 MG/ML IJ SOLN: 5.0000 mg | Freq: Three times a day (TID) | INTRAMUSCULAR | Status: DC | PRN

## 2023-02-17 MED ORDER — LORAZEPAM 2 MG PO TABS
2.0000 mg | ORAL_TABLET | Freq: Three times a day (TID) | ORAL | Status: DC | PRN
  Administered 2023-03-03 – 2023-03-05 (×6): 2 mg via ORAL
  Filled 2023-02-17 (×6): qty 1

## 2023-02-17 MED ORDER — LORAZEPAM 2 MG/ML IJ SOLN: 2.0000 mg | Freq: Three times a day (TID) | INTRAMUSCULAR | Status: DC | PRN

## 2023-02-17 MED ORDER — HALOPERIDOL 5 MG PO TABS
5.0000 mg | ORAL_TABLET | Freq: Three times a day (TID) | ORAL | Status: DC | PRN
  Administered 2023-03-03: 5 mg via ORAL
  Filled 2023-02-17: qty 1

## 2023-02-17 NOTE — Consult Note (Signed)
[facility] Consult Note  Patient Name: Joshua Lewis MRN: [id number] DOB: 1961-05-01 DATE OF Consult: 02/17/2023  PRIMARY PSYCHIATRIC DIAGNOSES  1.  MDD, recurrent, severe, with psychotic features  2.  Rule out substance induced mood disorder 3.  Rule out substance induced psychosis   RECOMMENDATIONS  Recommendations: Medication recommendations: Recommend starting Seroquel 50mg  po nightly for mood/psychosis; if tolerates increase to 100mg  nightly; Recommend hydroxyzine 25mg  po TID PRN anxiety; Recommend olanzapine 5mg  po TID PRN agitation  Non-Medication/therapeutic recommendations: Psychiatric hospitalization  Is inpatient psychiatric hospitalization recommended for this patient? Yes (Explain why): Suicidal ideation with intent and plan and unable to engage in safety planning Follow-Up Telepsychiatry C/L services: We will continue to follow this patient with you until stabilized or discharged.  If you have any questions or concerns, please call our TeleCare Coordination service at  [phone] and ask for myself or the provider on-call. Communication: Treatment team members (and family members if applicable) who were involved in treatment/care discussions and planning, and with whom we spoke or engaged with via secure text/chat, include the following: Dr. Silverio Lay; Victorino Dike, RN  Thank you for involving [place] in the care of this patient. If you have any additional questions or concerns, please call [phone] and ask for me or the provider on-call.  TELEPSYCHIATRY ATTESTATION & CONSENT  As the provider for this telehealth consult, I attest that I verified the patient's identity using two separate identifiers, introduced myself to the patient, provided my credentials, disclosed my location, and performed this encounter via a HIPAA-compliant, real-time, face-to-face, two-way, interactive audio and video platform and with the full consent and agreement of the patient (or guardian as applicable.)   Patient physical location: [facility]  Telehealth provider physical location: home office in state of [place]   Video start time: 0156 AM EST Video end time: 0210 AM EST   IDENTIFYING DATA  Joshua Lewis is a 62 y.o. year-old male for whom a psychiatric consultation has been ordered by the primary provider. The patient was identified using two separate identifiers.  CHIEF COMPLAINT/REASON FOR CONSULT  Suicidal ideation    HISTORY OF PRESENT ILLNESS (HPI)  Joshua Lewis is a 62 year old male, experiencing homelessness, with a history of depression, opioid and stimulant use disorders, chronic back pain, kidney carcinoma who presents to the ED with suicidal ideation. Chart reviewed including notes and results. UDS positive for cocaine and THC.   On evaluation, patient noted to be calm, cooperative, depressed, linear, endorsing auditory hallucinations, not appearing internally preoccupied, not responding to internal stimuli. Patient reports he came to the ED due to feeling suicidal and experiencing auditory hallucinations. He reports his family dropped him off to get help. And states his family does not want to see him anymore because he has been trying to hurt himself. Patient reports he has been engaging in non-suicidal self injury by cutting. Patient endorses suicidal ideation with intent and plan to get shot by cops. Patient endorses command auditory hallucinations to kill himself. And reports he has been kicking holes in the wall "because people outside are telling me to." Reports he began to experience auditory hallucinations several days ago. Patient endorses depressed mood, insomnia, fatigue, anhedonia, poor concentration, hopelessness, worthlessness. Denies symptoms consistent with mania/hypomania, paranoia, visual hallucinations, ideas of reference, homicidal ideation.Patient endorses cocaine use 2 times per week, last use yesterday. Denies the use of marijuana, opioids, hallucinogens.  Denies use of alcohol. Patient reports he stopped taking psychiatric medication 1 year ago because he did not  like how he felt on medication. Does not recall what medication he was taking. Patient unable to engage in safety planning at the time of evaluation. Reports he is hopeless and will attempt to kill himself "any way" if he leaves the hospital.      PAST PSYCHIATRIC HISTORY  Current psych meds: Denies  Prior psych meds: Does not recall meds  Outpatient: Primary doctor  Inpatient: Maybe 1-2 times previously  Non-suicidal self injury: Non-suicidal self injury by cutting, last did 2 nights ago Suicide attempts: Denies  Violence: Denies  Drugs/alcohol: Endorses cocaine use 2 times per week, last use yesterday Otherwise as per HPI above.  PAST MEDICAL HISTORY  Past Medical History:  Diagnosis Date   Chronic back pain greater than 3 months duration    Kidney carcinoma (HCC)    Depression  Opioid use disorder  Stimulant use disorder   HOME MEDICATIONS  Facility Ordered Medications  Medication   acetaminophen (TYLENOL) tablet 650 mg   alum & mag hydroxide-simeth (MAALOX/MYLANTA) 200-200-20 MG/5ML suspension 30 mL    ALLERGIES  No Known Allergies  SOCIAL & SUBSTANCE USE HISTORY  Social History   Socioeconomic History   Marital status: Married    Spouse name: Not on file   Number of children: Not on file   Years of education: Not on file   Highest education level: Not on file  Occupational History   Not on file  Tobacco Use   Smoking status: Some Days    Packs/day: .5    Types: Cigarettes   Smokeless tobacco: Never  Vaping Use   Vaping Use: Never used  Substance and Sexual Activity   Alcohol use: No   Drug use: No   Sexual activity: Not on file  Other Topics Concern   Not on file  Social History Narrative   Not on file   Social Determinants of Health   Financial Resource Strain: Not on file  Food Insecurity: Not on file  Transportation Needs: Not on file   Physical Activity: Not on file  Stress: Not on file  Social Connections: Not on file   Social History   Tobacco Use  Smoking Status Some Days   Packs/day: .5   Types: Cigarettes  Smokeless Tobacco Never   Social History   Substance and Sexual Activity  Alcohol Use No   Social History   Substance and Sexual Activity  Drug Use No    Additional pertinent information: States his family kicked him out. Endorses cocaine use twice weekly.   FAMILY HISTORY  Family History  Problem Relation Age of Onset   Cancer Mother    Diabetes Mother    Hypertension Mother    Family Psychiatric History (if known):  Denies   MENTAL STATUS EXAM (MSE)  Presentation  General Appearance:  Appropriate for Environment  Eye Contact: Poor  Speech: Clear and Coherent  Speech Volume: Normal   Mood and Affect  Mood: Depressed  Affect: Congruent   Thought Process  Thought Processes: Coherent   History of Schizophrenia/Schizoaffective disorder: Denies  Hallucinations: Hallucinations: None  Ideas of Reference: None  Suicidal Thoughts: Suicidal Thoughts: Yes, Active  Homicidal Thoughts: Homicidal Thoughts: No   Sensorium  Memory: Immediate Good; Recent Fair; Remote Fair  Judgment: Fair  Insight: Fair   Art therapist  Concentration: Fair  Attention Span:No data recorded Recall:No data recorded Fund of Knowledge: Fair  Language: Good   Psychomotor Activity  Psychomotor Activity: Psychomotor Activity: Normal   Assets  Assets: Communication  Skills   Sleep  Sleep: Poor   VITALS  Blood pressure (!) 146/98, pulse 90, temperature 98.5 F (36.9 C), temperature source Oral, resp. rate (!) 24, height 5\' 9"  (1.753 m), weight 67.3 kg, SpO2 100 %.  LABS  Admission on 02/16/2023  Component Date Value Ref Range Status   Sodium 02/16/2023 138  135 - 145 mmol/L Final   Potassium 02/16/2023 4.2  3.5 - 5.1 mmol/L Final   Chloride 02/16/2023 107  98  - 111 mmol/L Final   CO2 02/16/2023 21 (L)  22 - 32 mmol/L Final   Glucose, Bld 02/16/2023 90  70 - 99 mg/dL Final   Glucose reference range applies only to samples taken after fasting for at least 8 hours.   BUN 02/16/2023 10  8 - 23 mg/dL Final   Creatinine, Ser 02/16/2023 1.09  0.61 - 1.24 mg/dL Final   Calcium 24/40/1027 8.8 (L)  8.9 - 10.3 mg/dL Final   Total Protein 25/36/6440 6.6  6.5 - 8.1 g/dL Final   Albumin 34/74/2595 3.5  3.5 - 5.0 g/dL Final   AST 63/87/5643 15  15 - 41 U/L Final   ALT 02/16/2023 11  0 - 44 U/L Final   Alkaline Phosphatase 02/16/2023 91  38 - 126 U/L Final   Total Bilirubin 02/16/2023 0.5  0.3 - 1.2 mg/dL Final   GFR, Estimated 02/16/2023 >60  >60 mL/min Final   Comment: (NOTE) Calculated using the CKD-EPI Creatinine Equation (2021)    Anion gap 02/16/2023 10  5 - 15 Final   Performed at [facility], [address] [place], [place] [address]   Alcohol, Ethyl (B) 02/16/2023 <10  <10 mg/dL Final   Comment: (NOTE) Lowest detectable limit for serum alcohol is 10 mg/dL.  For medical purposes only. Performed at [facility], [address], [place], [place] [address]    Salicylate Lvl 02/16/2023 <7.0 (L)  7.0 - 30.0 mg/dL Final   Performed at [facility], [address], [place], [place] [address]   Acetaminophen (Tylenol), Serum 02/16/2023 <10 (L)  10 - 30 ug/mL Final   Comment: (NOTE) Therapeutic concentrations vary significantly. A range of 10-30 ug/mL  may be an effective concentration for many patients. However, some  are best treated at concentrations outside of this range. Acetaminophen concentrations >150 ug/mL at 4 hours after ingestion  and >50 ug/mL at 12 hours after ingestion are often associated with  toxic reactions.  Performed at [facility], [address] [place], [place] [address]    WBC 02/16/2023 8.9  4.0 - 10.5 K/uL Final   RBC 02/16/2023 4.95  4.22 - 5.81 MIL/uL Final   Hemoglobin 02/16/2023 14.0   13.0 - 17.0 g/dL Final   HCT 93/23/5573 43.8  39.0 - 52.0 % Final   MCV 02/16/2023 88.5  80.0 - 100.0 fL Final   MCH 02/16/2023 28.3  26.0 - 34.0 pg Final   MCHC 02/16/2023 32.0  30.0 - 36.0 g/dL Final   RDW 22/10/5425 14.3  11.5 - 15.5 % Final   Platelets 02/16/2023 326  150 - 400 K/uL Final   nRBC 02/16/2023 0.0  0.0 - 0.2 % Final   Performed at [facility], [address] [place], [place] [address]   Opiates 02/16/2023 NONE DETECTED  NONE DETECTED Final   Cocaine 02/16/2023 POSITIVE (A)  NONE DETECTED Final   Benzodiazepines 02/16/2023 NONE DETECTED  NONE DETECTED Final   Amphetamines 02/16/2023 NONE  DETECTED  NONE DETECTED Final   Tetrahydrocannabinol 02/16/2023 POSITIVE (A)  NONE DETECTED Final   Barbiturates 02/16/2023 NONE DETECTED  NONE DETECTED Final   Comment: (NOTE) DRUG SCREEN FOR MEDICAL PURPOSES ONLY.  IF CONFIRMATION IS NEEDED FOR ANY PURPOSE, NOTIFY LAB WITHIN 5 DAYS.  LOWEST DETECTABLE LIMITS FOR URINE DRUG SCREEN Drug Class                     Cutoff (ng/mL) Amphetamine and metabolites    1000 Barbiturate and metabolites    200 Benzodiazepine                 200 Opiates and metabolites        300 Cocaine and metabolites        300 THC                            50 Performed at [facility], [address] [place], [place] [address]     PSYCHIATRIC REVIEW OF SYSTEMS (ROS)  ROS: Notable for the following relevant positive findings: Review of Systems  Psychiatric/Behavioral:  Positive for depression, hallucinations, substance abuse and suicidal ideas. The patient has insomnia.     Additional findings:      Musculoskeletal: No abnormal movements observed      Gait & Station: Laying/Sitting      Pain Screening: Denies      Nutrition & Dental Concerns: Denies   RISK FORMULATION/ASSESSMENT  Is the patient experiencing any suicidal or homicidal ideations: Yes       Explain if yes: Endorses suicidal ideation with intent and plan - states if not  "suicide by cop" will find another way Protective factors considered for safety management: Help seeking  Risk factors/concerns considered for safety management:  Depression Substance abuse/dependence Age over 87 Hopelessness Male gender Unmarried Command auditory hallucinations  Non-suicidal self injury  Is there a safety management plan with the patient and treatment team to minimize risk factors and promote protective factors: Yes           Explain: Psychiatric hospitalization  Is crisis care placement or psychiatric hospitalization recommended: Yes     Based on my current evaluation and risk assessment, patient is determined at this time to be at:  High risk  *RISK ASSESSMENT Risk assessment is a dynamic process; it is possible that this patient's condition, and risk level, may change. This should be re-evaluated and managed over time as appropriate. Please re-consult psychiatric consult services if additional assistance is needed in terms of risk assessment and management. If your team decides to discharge this patient, please advise the patient how to best access emergency psychiatric services, or to call 911, if their condition worsens or they feel unsafe in any way.  Joshua Lewis is a 62 year old male, experiencing homelessness, with a history of depression, opioid and stimulant use disorders, chronic back pain, kidney carcinoma who presents to the ED with suicidal ideation. On evaluation, patient noted to be calm, cooperative, depressed, linear, endorsing auditory hallucinations, not appearing internally preoccupied, not responding to internal stimuli. Patient endorses his family is done with him and dropped him off at the ED. He endorses depressed mood, insomnia, fatigue, anhedonia, poor concentration, hopelessness, worthlessness. Endorses suicidal ideation with intent and plan to get the police to shoot him but will find another plan if not. Endorses command auditory hallucinations  telling him to kill himself that began several days  ago. Joshua Lewis cocaine use 2 times per week, last use yesterday. Patient unable to engage in safety planning at the time of evaluation. Reports he is hopeless and will attempt to kill himself "any way" if he leaves the hospital. Patient's presentation is consistent with MDD, recurrent, severe with psychotic features; rule out substance induced mood disorder/psychosis. Patient endorses suicidal ideation with intent and plan. Patient is high risk for suicide due to depression, substance use, hopelessness, male gender, unmarried, non-suicidal self injury, command auditory hallucinations. Therefore, inpatient psychiatric hospitalization is recommended.      Adria Dill, MD Telepsychiatry Consult Services

## 2023-02-17 NOTE — BH Assessment (Signed)
[facility] tele[facility] contacted TTS and informed this clinician that pt will be assessed by Dr. Gilman Schmidt at 2:15 am.

## 2023-02-17 NOTE — Consult Note (Signed)
  Pt seen by Dr. Gilman Schmidt with [facility] today and recommended for inpatient psychiatric treatment. See below  Joshua Lewis is a 62 year old male, experiencing homelessness, with a history of depression, opioid and stimulant use disorders, chronic back pain, kidney carcinoma who presents to the ED with suicidal ideation. On evaluation, patient noted to be calm, cooperative, depressed, linear, endorsing auditory hallucinations, not appearing internally preoccupied, not responding to internal stimuli. Patient endorses his family is done with him and dropped him off at the ED. He endorses depressed mood, insomnia, fatigue, anhedonia, poor concentration, hopelessness, worthlessness. Endorses suicidal ideation with intent and plan to get the police to shoot him but will find another plan if not. Endorses command auditory hallucinations telling him to kill himself that began several days ago. Endorses cocaine use 2 times per week, last use yesterday. Patient unable to engage in safety planning at the time of evaluation. Reports he is hopeless and will attempt to kill himself "any way" if he leaves the hospital. Patient's presentation is consistent with MDD, recurrent, severe with psychotic features; rule out substance induced mood disorder/psychosis. Patient endorses suicidal ideation with intent and plan. Patient is high risk for suicide due to depression, substance use, hopelessness, male gender, unmarried, non-suicidal self injury, command auditory hallucinations. Therefore, inpatient psychiatric hospitalization is recommended.   Will continue recommendation for Inpatient psychiatric treatment at this time.

## 2023-02-17 NOTE — BH Assessment (Signed)
Pt was seen by [facility] tele[facility]. Dr. Gilman Schmidt recommends inpatient hospitalization. [facility] to assist with placement.

## 2023-02-17 NOTE — Progress Notes (Signed)
[facility] LCSW Progress Note   02/17/2023    4:34 PM  Joshua Lewis   [id number]   Type of Contact and Topic:  Psychiatric Bed Placement   Pt accepted to [facility]    Patient meets inpatient criteria per Eligha Bridegroom, NP    The attending provider will be Dr. Toni Amend  Call report to [phone]  Denton Ar, RN @ [facility] ED notified.     Pt scheduled  to arrive at [facility] TODAY awaiting voluntary consent forms.   Damita Dunnings, MSW, LCSW-A  4:39 PM 02/17/2023

## 2023-02-17 NOTE — ED Provider Notes (Signed)
Emergency Medicine Observation Re-evaluation Note  Joshua Lewis is a 62 y.o. male, seen on rounds today.  Pt initially presented to the ED for complaints of Suicidal Currently, the patient is comfortable.  Physical Exam  BP 130/82 (BP Location: Left Arm)   Pulse (!) 57   Temp 98.5 F (36.9 C) (Oral)   Resp 20   Ht 5\' 9"  (1.753 m)   Wt 67.3 kg   SpO2 98%   BMI 21.91 kg/m  Physical Exam General: NAD   ED Course / MDM  EKG:   I have reviewed the labs performed to date as well as medications administered while in observation.  Recent changes in the last 24 hours include no acute events reported.  Plan  Current plan is for psych placement.    Wynetta Fines, MD 02/17/23 [phone]

## 2023-02-17 NOTE — ED Notes (Signed)
Belongings located in locker #7.  Belongings noted to be in locker 7 at this time are as followed: 4 shirts, 1 pair of pants, 1 pair of shorts, 1 pair of shoes, 1 pair of socks, 1 hat.   Valuables located in belongings bag at this time are as followed: cell phone w/ screen not intact, charging cable, charging cord, Eva, [employer] 1885, SS card, [employer] [id number], visa [phone], multiple ID cards.  Belongings placed back in locker #7 and valuables locked up w/ security.

## 2023-02-17 NOTE — ED Notes (Signed)
Patient voiced concern for [place] another [place] without transport home. RN explained that facility can assist patient in transport home.

## 2023-02-18 ENCOUNTER — Other Ambulatory Visit: Payer: Self-pay

## 2023-02-18 MED ORDER — ESCITALOPRAM OXALATE 10 MG PO TABS
10.0000 mg | ORAL_TABLET | Freq: Every day | ORAL | Status: DC
  Administered 2023-02-18 – 2023-02-21 (×4): 10 mg via ORAL
  Filled 2023-02-18 (×5): qty 1

## 2023-02-18 NOTE — [facility] Suicide Risk Assessment (Signed)
[facility] INPATIENT:  Family/Significant Other Suicide Prevention Education  Suicide Prevention Education:  Contact Attempts: wife/Louise Duell [phone], has been identified by the patient as the family member/significant other with whom the patient will be residing, and identified as the person(s) who will aid the patient in the event of a mental health crisis.  With written consent from the patient, two attempts were made to provide suicide prevention education, prior to and/or following the patient's discharge.  We were unsuccessful in providing suicide prevention education.  A suicide education pamphlet was given to the patient to share with family/significant other.  Date and time of first attempt: 02/18/2023 at 5:00 pm Date and time of second attempt:Second attempt needed  [name] 02/18/2023, 5:06 PM

## 2023-02-18 NOTE — Tx Team (Cosign Needed)
Initial Treatment Plan 02/18/2023 1:28 AM Joshua Lewis ZOX:[id number]    PATIENT STRESSORS: Financial difficulties   Marital or family conflict   Substance abuse     PATIENT STRENGTHS: General fund of knowledge  Motivation for treatment/growth    PATIENT IDENTIFIED PROBLEMS: Depression    Substance Abuse                    DISCHARGE CRITERIA:  Improved stabilization in mood, thinking, and/or behavior Motivation to continue treatment in a less acute level of care  PRELIMINARY DISCHARGE PLAN: Outpatient therapy Participate in family therapy  PATIENT/FAMILY INVOLVEMENT: This treatment plan has been presented to and reviewed with the patient, Joshua Lewis,  The patient and family have been given the opportunity to ask questions and make suggestions.  Governor Specking Aleaha Fickling, RN 02/18/2023, 1:28 AM
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
## 2023-02-18 NOTE — Plan of Care (Signed)
Pt denies SI / HI /AVH at this time. Pt provides minimal information during interview. Pt is adherent with new scheduled medication. Staff will continue to monitor Q 15 for safety.    Problem: Education: Goal: Knowledge of General Education information will improve Description: Including pain rating scale, medication(s)/side effects and non-pharmacologic comfort measures Outcome: Not Progressing   Problem: Health Behavior/Discharge Planning: Goal: Ability to manage health-related needs will improve Outcome: Not Progressing   Problem: Clinical Measurements: Goal: Ability to maintain clinical measurements within normal limits will improve Outcome: Not Progressing

## 2023-02-18 NOTE — H&P (Signed)
Psychiatric Admission Assessment Adult  Patient Identification: Joshua Lewis MRN:  [id number] Date of Evaluation:  02/18/2023 Chief Complaint:  MDD (major depressive disorder) [F32.9] Principal Diagnosis: MDD (major depressive disorder) Diagnosis:  Principal Problem:   MDD (major depressive disorder) Chief Complaints : " I was having suicidal thoughts and depression".   History of Present Illness: Joshua Lewis is a 62 year old male, experiencing homelessness, with a history of depression, opioid and stimulant use disorders, chronic back pain, kidney carcinoma who presents to the ED with suicidal ideation. Chart reviewed including notes and results. UDS positive for cocaine and THC. Patient tearful in triage stating he is tried of hurting himself. Pt raises his shirt and shows me multiple healed lacerations to abdomen where he a few nights ago cut himself several times with a razor blade. Patient's current plan for suicide is to ask the police to shoot him. Prescribed medication for depression through the [place] but does not take it. States his family has left him "because they think I'm crazy."  On initial evaluation through [facility],  patient noted to be calm, cooperative, depressed, linear, endorsing auditory hallucinations, not appearing internally preoccupied, not responding to internal stimuli. Patient reports he came to the ED due to feeling suicidal and experiencing auditory hallucinations. He reports his family dropped him off to get help. And states his family does not want to see him anymore because he has been trying to hurt himself. Patient reports he has been engaging in non-suicidal self injury by cutting. Patient endorses suicidal ideation with intent and plan to get shot by cops. Patient endorses command auditory hallucinations to kill himself. And reports he has been kicking holes in the wall "because people outside are telling me to." Reports he began to experience auditory hallucinations  several days ago. Patient endorses depressed mood, insomnia, fatigue, anhedonia, poor concentration, hopelessness, worthlessness. Denies symptoms consistent with mania/hypomania, paranoia, visual hallucinations, ideas of reference, homicidal ideation.Patient endorses cocaine use 2 times per week, last use yesterday. Denies the use of marijuana, opioids, hallucinogens. Denies use of alcohol. Patient reports he stopped taking psychiatric medication 1 year ago because he did not like how he felt on medication. Does not recall what medication he was taking. Patient unable to engage in safety planning at the time of evaluation. Reports he is hopeless and will attempt to kill himself "any way" if he leaves the hospital.  Patient has been calm, compliant with his meds and slept well last night. He presents now in a moderate to severely depressed mood, has a flat affect and poor eye contact. He is displaying psychomotor retardation and anxiety. He reports staying in a motel with his family but ran out of funds and now he is homeless, while his family is staying with some friends. He has a long H/O depression and substance abuse and has been prescribed meds before but he has not been compliant. He has been getting increasingly despondent over the last few weeks, started feeling suicidal 2 days ago and reached out for help at [facility]. He has made some superficial scratches on his abdomen and reports 1 past H/O suicide attempt by cutting. He is denying active SI at this time, is denying any HI or AVH. He has been using cocaine occasionally and had AH while using it. He reports feelings of hopelessness, anhedonia, poor energy and guilt. He reports being from [place] area and his family will leave to return there in 10 days if he cannot get himself together. He is  agreeable to starting Lexapro and get help he needs to get better.   Associated Signs/Symptoms: Depression Symptoms:  depressed  mood, anhedonia, insomnia, psychomotor retardation, fatigue, feelings of worthlessness/guilt, hopelessness, recurrent thoughts of death, suicidal thoughts without plan, anxiety, loss of energy/fatigue, (Hypo) Manic Symptoms:  None Anxiety Symptoms:  Excessive Worry, Social Anxiety, Psychotic Symptoms:   Denies currently PTSD Symptoms: Negative Total Time spent with patient: 1 hour  Past Psychiatric History: Current psych meds: Denies  Prior psych meds: Does not recall meds  Outpatient: Primary doctor  Inpatient: Maybe 1-2 times previously  Non-suicidal self injury: Non-suicidal self injury by cutting, last did 2 nights ago Suicide attempts: Denies  Violence: Denies  Drugs/alcohol: Endorses cocaine use 2 times per week, last use yesterday Otherwise as per HPI above. MDD, Cocaine and THC Abuse Is the patient at risk to self? Yes.    Has the patient been a risk to self in the past 6 months? Yes.    Has the patient been a risk to self within the distant past? No.  Is the patient a risk to others? No.  Has the patient been a risk to others in the past 6 months? No.  Has the patient been a risk to others within the distant past? No.   [place] Scale:  Flowsheet Row Admission (Current) from 02/17/2023 in [facility] INPATIENT BE[facility] ED from 02/16/2023 in [facility] Emergency Department at [facility]) [facility]  C-SSRS RISK CATEGORY High Risk High Risk High Risk        Prior Inpatient Therapy: Yes.    Prior Outpatient Therapy: Yes.   If yes, [facility]  Alcohol Screening: 1. How often do you have a drink containing alcohol?: 4 or more times a week 2. How many drinks containing alcohol do you have on a typical day when you are drinking?: 5 or 6 3. How often do you have six or more drinks on one occasion?: Weekly AUDIT-C Score: 9 4. How often during the last year have you found that you were not  able to stop drinking once you had started?: Monthly 5. How often during the last year have you failed to do what was normally expected from you because of drinking?: Monthly 6. How often during the last year have you needed a first drink in the morning to get yourself going after a heavy drinking session?: Monthly 7. How often during the last year have you had a feeling of guilt of remorse after drinking?: Weekly 8. How often during the last year have you been unable to remember what happened the night before because you had been drinking?: Monthly 9. Have you or someone else been injured as a result of your drinking?: No 10. Has a relative or friend or a doctor or another health worker been concerned about your drinking or suggested you cut down?: No Alcohol Use Disorder Identification Test Final Score (AUDIT): 20 Alcohol Brief Interventions/Follow-up: Alcohol education/Brief advice Substance Abuse History in the last 12 months:  Yes.   Consequences of Substance Abuse: Negative Family Consequences:    Previous Psychotropic Medications: Yes  Psychological Evaluations: No  Past Medical History:  Past Medical History:  Diagnosis Date   Chronic back pain greater than 3 months duration    Kidney carcinoma [facility])     Past Surgical History:  Procedure Laterality Date   ESOPHAGOGASTRODUODENOSCOPY (EGD) WITH PROPOFOL Left 08/09/2020   Procedure: ESOPHAGOGASTRODUODENOSCOPY (EGD) WITH PROPOFOL;  Surgeon:  Vida Rigger, MD;  Location: [facility] ENDOSCOPY;  Service: Endoscopy;  Laterality: Left;   HAND SURGERY     HERNIA REPAIR     NEPHRECTOMY     right   Family History:  Family History  Problem Relation Age of Onset   Cancer Mother    Diabetes Mother    Hypertension Mother    Family Psychiatric  History: Denies  Tobacco Screening:  Social History   Tobacco Use  Smoking Status Some Days   Packs/day: 0.50   Years: 25.00   Additional pack years: 0.00   Total pack years: 12.50   Types:  Cigarettes  Smokeless Tobacco Never    BH Tobacco Counseling     Are you interested in Tobacco Cessation Medications?  Yes, implement Nicotene Replacement Protocol Counseled patient on smoking cessation:  Yes Reason Tobacco Screening Not Completed: No value filed.       Social History:  Social History   Substance and Sexual Activity  Alcohol Use Yes   Alcohol/week: 10.0 standard drinks of alcohol   Types: 10 Cans of beer per week     Social History   Substance and Sexual Activity  Drug Use Yes   Frequency: 1.0 times per week   Types: Cocaine    Additional Social History:                           Allergies:  No Known Allergies Lab Results:  Results for orders placed or performed during the hospital encounter of 02/16/23 (from the past 48 hour(s))  Rapid urine drug screen (hospital performed)     Status: Abnormal   Collection Time: 02/16/23  6:53 PM  Result Value Ref Range   Opiates NONE DETECTED NONE DETECTED   Cocaine POSITIVE (A) NONE DETECTED   Benzodiazepines NONE DETECTED NONE DETECTED   Amphetamines NONE DETECTED NONE DETECTED   Tetrahydrocannabinol POSITIVE (A) NONE DETECTED   Barbiturates NONE DETECTED NONE DETECTED    Comment: (NOTE) DRUG SCREEN FOR MEDICAL PURPOSES ONLY.  IF CONFIRMATION IS NEEDED FOR ANY PURPOSE, NOTIFY LAB WITHIN 5 DAYS.  LOWEST DETECTABLE LIMITS FOR URINE DRUG SCREEN Drug Class                     Cutoff (ng/mL) Amphetamine and metabolites    1000 Barbiturate and metabolites    200 Benzodiazepine                 200 Opiates and metabolites        300 Cocaine and metabolites        300 THC                            50 Performed at [facility], [address], [place], [place] [address]   Comprehensive metabolic panel     Status: Abnormal   Collection Time: 02/16/23  7:02 PM  Result Value Ref Range   Sodium 138 135 - 145 mmol/L   Potassium 4.2 3.5 - 5.1 mmol/L   Chloride 107 98 - 111 mmol/L   CO2 21 (L)  22 - 32 mmol/L   Glucose, Bld 90 70 - 99 mg/dL    Comment: Glucose reference range applies only to samples taken after fasting for at least 8 hours.   BUN 10 8 - 23 mg/dL   Creatinine, Ser 5.78 0.61 - 1.24 mg/dL   Calcium 8.8 (L)  8.9 - 10.3 mg/dL   Total Protein 6.6 6.5 - 8.1 g/dL   Albumin 3.5 3.5 - 5.0 g/dL   AST 15 15 - 41 U/L   ALT 11 0 - 44 U/L   Alkaline Phosphatase 91 38 - 126 U/L   Total Bilirubin 0.5 0.3 - 1.2 mg/dL   GFR, Estimated >14 >78 mL/min    Comment: (NOTE) Calculated using the CKD-EPI Creatinine Equation (2021)    Anion gap 10 5 - 15    Comment: Performed at [facility], [address] [place], [place] [address]  Ethanol     Status: None   Collection Time: 02/16/23  7:02 PM  Result Value Ref Range   Alcohol, Ethyl (B) <10 <10 mg/dL    Comment: (NOTE) Lowest detectable limit for serum alcohol is 10 mg/dL.  For medical purposes only. Performed at [facility], [address], [place], [place] [address]   Salicylate level     Status: Abnormal   Collection Time: 02/16/23  7:02 PM  Result Value Ref Range   Salicylate Lvl <7.0 (L) 7.0 - 30.0 mg/dL    Comment: Performed at [facility], [address], [place], [place] [address]  Acetaminophen level     Status: Abnormal   Collection Time: 02/16/23  7:02 PM  Result Value Ref Range   Acetaminophen (Tylenol), Serum <10 (L) 10 - 30 ug/mL    Comment: (NOTE) Therapeutic concentrations vary significantly. A range of 10-30 ug/mL  may be an effective concentration for many patients. However, some  are best treated at concentrations outside of this range. Acetaminophen concentrations >150 ug/mL at 4 hours after ingestion  and >50 ug/mL at 12 hours after ingestion are often associated with  toxic reactions.  Performed at [facility], [address] [place], [place] [address]   cbc     Status: None   Collection Time: 02/16/23  7:02 PM  Result Value Ref Range   WBC 8.9 4.0 - 10.5 K/uL    RBC 4.95 4.22 - 5.81 MIL/uL   Hemoglobin 14.0 13.0 - 17.0 g/dL   HCT 28.4 13.2 - 44.0 %   MCV 88.5 80.0 - 100.0 fL   MCH 28.3 26.0 - 34.0 pg   MCHC 32.0 30.0 - 36.0 g/dL   RDW 10.2 72.5 - 36.6 %   Platelets 326 150 - 400 K/uL   nRBC 0.0 0.0 - 0.2 %    Comment: Performed at [facility], [address], [place], [place] [address]    Blood Alcohol level:  Lab Results  Component Value Date   ETH <10 02/16/2023   ETH <10 08/22/2021    Metabolic Disorder Labs:  No results found for: "HGBA1C", "MPG" No results found for: "PROLACTIN" Lab Results  Component Value Date   CHOL 204 (H) 01/28/2020   TRIG 68 01/28/2020   HDL 36 (L) 01/28/2020   CHOLHDL 5.7 01/28/2020   VLDL 14 01/28/2020   LDLCALC 154 (H) 01/28/2020    Current Medications: Current Facility-Administered Medications  Medication Dose Route Frequency Provider Last Rate Last Admin   acetaminophen (TYLENOL) tablet 650 mg  650 mg Oral Q4H PRN Eligha Bridegroom, NP       alum & mag hydroxide-simeth (MAALOX/MYLANTA) 200-200-20 MG/5ML suspension 30 mL  30 mL Oral Q6H PRN Eligha Bridegroom, NP       diphenhydrAMINE (BENADRYL) capsule 50 mg  50 mg Oral TID PRN Eligha Bridegroom, NP  Or   diphenhydrAMINE (BENADRYL) injection 50 mg  50 mg Intramuscular TID PRN Eligha Bridegroom, NP       escitalopram (LEXAPRO) tablet 10 mg  10 mg Oral Daily Heron Pitcock       haloperidol (HALDOL) tablet 5 mg  5 mg Oral TID PRN Eligha Bridegroom, NP       Or   haloperidol lactate (HALDOL) injection 5 mg  5 mg Intramuscular TID PRN Eligha Bridegroom, NP       hydrOXYzine (ATARAX) tablet 25 mg  25 mg Oral TID PRN Eligha Bridegroom, NP       LORazepam (ATIVAN) tablet 2 mg  2 mg Oral TID PRN Eligha Bridegroom, NP       Or   LORazepam (ATIVAN) injection 2 mg  2 mg Intramuscular TID PRN Eligha Bridegroom, NP       magnesium hydroxide (MILK OF MAGNESIA) suspension 30 mL  30 mL Oral Daily PRN Eligha Bridegroom, NP       nicotine (NICODERM CQ - dosed  in mg/24 hours) patch 14 mg  14 mg Transdermal Daily Clapacs, John T, MD       QUEtiapine (SEROQUEL XR) 24 hr tablet 50 mg  50 mg Oral QHS Eligha Bridegroom, NP   50 mg at 02/17/23 2233   PTA Medications: No medications prior to admission.    Musculoskeletal: Strength & Muscle Tone: within normal limits Gait & Station: normal Patient leans: Front            Psychiatric Specialty Exam:  Presentation  General Appearance:  Appropriate for Environment; Casual; Neat  Eye Contact: Poor  Speech: Normal Rate  Speech Volume: Decreased  Handedness:Right   Mood and Affect  Mood: Anxious; Depressed  Affect: Flat   Thought Process  Thought Processes: Coherent  Duration of Psychotic Symptoms: Years Past Diagnosis of Schizophrenia or Psychoactive disorder: No Descriptions of Associations:Intact  Orientation:Full (Time, Place and Person)  Thought Content:Logical  Hallucinations:Hallucinations: None  Ideas of Reference:None  Suicidal Thoughts:Suicidal Thoughts: Yes, Passive SI Passive Intent and/or Plan: Without Intent; Without Means to Carry Out  Homicidal Thoughts:Homicidal Thoughts: No   Sensorium  Memory: Immediate Fair; Remote Good  Judgment: Fair  Insight: Fair   Art therapist  Concentration: Fair  Attention Span:Fair  Recall:Fair  [employer] of Knowledge: Fair  Language: Fair   Psychomotor Activity  Psychomotor Activity: Psychomotor Activity: Psychomotor Retardation   Assets  Assets: Communication Skills; Desire for Improvement; Financial Resources/Insurance; Social Support   Sleep  Sleep:Sleep: Fair    Physical Exam: Physical Exam Constitutional:      Appearance: Normal appearance.  HENT:     Head: Normocephalic and atraumatic.     Right Ear: External ear normal.     Left Ear: External ear normal.     Nose: Nose normal.     Mouth/Throat:     Mouth: Mucous membranes are moist.  Eyes:      Conjunctiva/sclera: Conjunctivae normal.     Pupils: Pupils are equal, round, and reactive to light.  Cardiovascular:     Rate and Rhythm: Normal rate and regular rhythm.     Pulses: Normal pulses.     Heart sounds: Normal heart sounds.  Pulmonary:     Effort: Pulmonary effort is normal.     Breath sounds: Normal breath sounds.  Abdominal:     General: Abdomen is flat. Bowel sounds are normal.     Palpations: Abdomen is soft.  Musculoskeletal:        General: Normal  range of motion.     Cervical back: Normal range of motion and neck supple.  Skin:    General: Skin is warm.  Neurological:     General: No focal deficit present.     Mental Status: He is alert.    Review of Systems  Constitutional: Negative.   HENT: Negative.    Eyes: Negative.   Respiratory: Negative.    Cardiovascular: Negative.   Gastrointestinal: Negative.   Genitourinary: Negative.   Musculoskeletal: Negative.   Skin: Negative.   Neurological: Negative.   Endo/Heme/Allergies: Negative.   Psychiatric/Behavioral:  Positive for depression, substance abuse and suicidal ideas. The patient is nervous/anxious.    Blood pressure 121/83, pulse 73, temperature 98.4 F (36.9 C), temperature source Oral, resp. rate 16, height 5\' 9"  (1.753 m), weight 67.6 kg, SpO2 100 %. Body mass index is 22 kg/m.  Treatment Plan Summary: Daily contact with patient to assess and evaluate symptoms and progress in treatment, Medication management, and Plan : Patient will be [facility] to [facility] on voluntary basis. We will continue him on Seroquel at bedtime for mood and sleep. We will start him on Lexapro for depression and anxiety.   Observation Level/Precautions:  15 minute checks  Laboratory:      Psychotherapy:    Medications:    Consultations:    Discharge Concerns:    Estimated LOS:  Other:     Physician Treatment Plan for Primary Diagnosis: MDD (major depressive disorder), Recurrent Severe Cocaine Use Disorder Long Term  Goal(s): Improvement in symptoms so as ready for discharge  Short Term Goals: Ability to identify changes in lifestyle to reduce recurrence of condition will improve, Ability to demonstrate self-control will improve, and Compliance with prescribed medications will improve  Physician Treatment Plan for Secondary Diagnosis: Principal Problem:   MDD (major depressive disorder)  Long Term Goal(s): Improvement in symptoms so as ready for discharge  Short Term Goals: Ability to demonstrate self-control will improve and Ability to identify triggers associated with substance abuse/mental health issues will improve  I certify that inpatient services furnished can reasonably be expected to improve the patient's condition.    Elvi Leventhal 6/9/20248:27 AM

## 2023-02-18 NOTE — Plan of Care (Signed)
  Problem: Nutrition: Goal: Adequate nutrition will be maintained Outcome: Progressing  Patient was encouraged to eat snacks and hydrate

## 2023-02-18 NOTE — [facility] Suicide Risk Assessment (Signed)
[facility] Admission Suicide Risk Assessment   Nursing information obtained from:  Patient Demographic factors:  Low socioeconomic status, Unemployed Current Mental Status:  NA Loss Factors:  Loss of significant relationship, Financial problems / change in socioeconomic status Historical Factors:  Impulsivity Risk Reduction Factors:  Positive social support, Positive therapeutic relationship  Total Time spent with patient: 20 minutes Principal Problem: MDD (major depressive disorder) Diagnosis:  Principal Problem:   MDD (major depressive disorder)  Subjective Data: see H&P  Continued Clinical Symptoms:  Alcohol Use Disorder Identification Test Final Score (AUDIT): 20 The "Alcohol Use Disorders Identification Test", Guidelines for Use in Primary Care, Second Edition.  World Science writer [facility]). Score between 0-7:  no or low risk or alcohol related problems. Score between 8-15:  moderate risk of alcohol related problems. Score between 16-19:  high risk of alcohol related problems. Score 20 or above:  warrants further diagnostic evaluation for alcohol dependence and treatment.   CLINICAL FACTORS:   Depression:   Hopelessness Impulsivity Alcohol/Substance Abuse/Dependencies Unstable or Poor Therapeutic Relationship Medical Diagnoses and Treatments/Surgeries   Musculoskeletal: Strength & Muscle Tone: within normal limits Gait & Station: normal Patient leans: Front  Psychiatric Specialty Exam:  Presentation  General Appearance:  Appropriate for Environment; Casual; Neat  Eye Contact: Poor  Speech: Normal Rate  Speech Volume: Decreased  Handedness: Right   Mood and Affect  Mood: Anxious; Depressed  Affect: Flat   Thought Process  Thought Processes: Coherent  Descriptions of Associations:Intact  Orientation:Full (Time, Place and Person)  Thought Content:Logical  History of Schizophrenia/Schizoaffective disorder:No data recorded Duration of Psychotic  Symptoms:No data recorded Hallucinations:Hallucinations: None  Ideas of Reference:None  Suicidal Thoughts:Suicidal Thoughts: Yes, Passive SI Passive Intent and/or Plan: Without Intent; Without Means to Carry Out  Homicidal Thoughts:Homicidal Thoughts: No   Sensorium  Memory: Immediate Fair; Remote Good  Judgment: Fair  Insight: Fair   Chartered certified accountant: Fair  Attention Span: Fair  Recall: [employer] of Knowledge: Fair  Language: Fair   Psychomotor Activity  Psychomotor Activity: Psychomotor Activity: Psychomotor Retardation   Assets  Assets: Communication Skills; Desire for Improvement; Financial Resources/Insurance; Social Support   Sleep  Sleep: Sleep: Fair    Physical Exam: Physical Exam ROS Blood pressure 121/83, pulse 73, temperature 98.4 F (36.9 C), temperature source Oral, resp. rate 16, height 5\' 9"  (1.753 m), weight 67.6 kg, SpO2 100 %. Body mass index is 22 kg/m.   COGNITIVE FEATURES THAT CONTRIBUTE TO RISK:  Polarized thinking    SUICIDE RISK:   Moderate:  Frequent suicidal ideation with limited intensity, and duration, some specificity in terms of plans, no associated intent, good self-control, limited dysphoria/symptomatology, some risk factors present, and identifiable protective factors, including available and accessible social support.  PLAN OF CARE: Continue current treatment.   I certify that inpatient services furnished can reasonably be expected to improve the patient's condition.   Leo Rod 02/18/2023, 8:31 AM

## 2023-02-18 NOTE — Progress Notes (Signed)
Admission Note:  62 yr male who presents voluntary commitment in no acute distress for the treatment of SI and Depression. Patient appears flat, sad and sullen, but he was calm and cooperative with admission process. Patient denies SI/HI/AVH and contracts for safety upon admission, he was searched, skin appears warm to touch, and no contraband found, POC and unit policies explained and understanding verbalized. Consents obtained. Food and fluids offered, and patient accepted both fluids and snack. Patient was oriented to the unit. 15 minutes safety checks maintained will continue to monitor

## 2023-02-19 DIAGNOSIS — F1414 Cocaine abuse with cocaine-induced mood disorder: Secondary | ICD-10-CM | POA: Insufficient documentation

## 2023-02-19 DIAGNOSIS — F332 Major depressive disorder, recurrent severe without psychotic features: Secondary | ICD-10-CM | POA: Diagnosis not present

## 2023-02-19 MED ORDER — IBUPROFEN 600 MG PO TABS
600.0000 mg | ORAL_TABLET | Freq: Four times a day (QID) | ORAL | Status: DC | PRN
  Administered 2023-02-24: 600 mg via ORAL
  Filled 2023-02-19 (×2): qty 1

## 2023-02-19 NOTE — BHH Counselor (Signed)
CSW met with pt briefly per request. Pt shared that he is going through a lot right now. He stated that he is a veteran who is originally from [place]. Pt informed CSW that his family is leaving to return to [place] but he has been told that he cannot return home until he gets better. Pt and CSW discussed [facility]. CSW informed pt that he would reach out to them to see what needed to be done to get him connected. He agreed. No other concerns expressed. Contact ended without incident.   Vilma Meckel. Algis Greenhouse, MSW, LCSW, LCAS 02/19/2023 3:18 PM

## 2023-02-19 NOTE — BH IP Treatment Plan (Signed)
Interdisciplinary Treatment and Diagnostic Plan Update  02/19/2023 Time of Session: 08:50 Joshua Lewis MRN: [id number]  Principal Diagnosis: MDD (major depressive disorder)  Secondary Diagnoses: Principal Problem:   MDD (major depressive disorder)   Current Medications:  Current Facility-Administered Medications  Medication Dose Route Frequency Provider Last Rate Last Admin   acetaminophen (TYLENOL) tablet 650 mg  650 mg Oral Q4H PRN Eligha Bridegroom, NP       alum & mag hydroxide-simeth (MAALOX/MYLANTA) 200-200-20 MG/5ML suspension 30 mL  30 mL Oral Q6H PRN Eligha Bridegroom, NP       diphenhydrAMINE (BENADRYL) capsule 50 mg  50 mg Oral TID PRN Eligha Bridegroom, NP       Or   diphenhydrAMINE (BENADRYL) injection 50 mg  50 mg Intramuscular TID PRN Eligha Bridegroom, NP       escitalopram (LEXAPRO) tablet 10 mg  10 mg Oral Daily Malik, Kashif   10 mg at 02/19/23 9528   haloperidol (HALDOL) tablet 5 mg  5 mg Oral TID PRN Eligha Bridegroom, NP       Or   haloperidol lactate (HALDOL) injection 5 mg  5 mg Intramuscular TID PRN Eligha Bridegroom, NP       hydrOXYzine (ATARAX) tablet 25 mg  25 mg Oral TID PRN Eligha Bridegroom, NP       LORazepam (ATIVAN) tablet 2 mg  2 mg Oral TID PRN Eligha Bridegroom, NP       Or   LORazepam (ATIVAN) injection 2 mg  2 mg Intramuscular TID PRN Eligha Bridegroom, NP       magnesium hydroxide (MILK OF MAGNESIA) suspension 30 mL  30 mL Oral Daily PRN Eligha Bridegroom, NP       nicotine (NICODERM CQ - dosed in mg/24 hours) patch 14 mg  14 mg Transdermal Daily Clapacs, John T, MD   14 mg at 02/19/23 0909   QUEtiapine (SEROQUEL XR) 24 hr tablet 50 mg  50 mg Oral QHS Eligha Bridegroom, NP   50 mg at 02/17/23 2233   PTA Medications: No medications prior to admission.    Patient Stressors: Financial difficulties   Marital or family conflict   Substance abuse    Patient Strengths: Automotive engineer for treatment/growth   Treatment Modalities:  Medication Management, Group therapy, Case management,  1 to 1 session with clinician, Psychoeducation, Recreational therapy.   Physician Treatment Plan for Primary Diagnosis: MDD (major depressive disorder) Long Term Goal(s): Improvement in symptoms so as ready for discharge   Short Term Goals: Ability to demonstrate self-control will improve Ability to identify triggers associated with substance abuse/mental health issues will improve Ability to identify changes in lifestyle to reduce recurrence of condition will improve Compliance with prescribed medications will improve  Medication Management: Evaluate patient's response, side effects, and tolerance of medication regimen.  Therapeutic Interventions: 1 to 1 sessions, Unit Group sessions and Medication administration.  Evaluation of Outcomes: Not Met  Physician Treatment Plan for Secondary Diagnosis: Principal Problem:   MDD (major depressive disorder)  Long Term Goal(s): Improvement in symptoms so as ready for discharge   Short Term Goals: Ability to demonstrate self-control will improve Ability to identify triggers associated with substance abuse/mental health issues will improve Ability to identify changes in lifestyle to reduce recurrence of condition will improve Compliance with prescribed medications will improve     Medication Management: Evaluate patient's response, side effects, and tolerance of medication regimen.  Therapeutic Interventions: 1 to 1 sessions, Unit Group sessions and Medication administration.  Evaluation of Outcomes: Not Met   RN Treatment Plan for Primary Diagnosis: MDD (major depressive disorder) Long Term Goal(s): Knowledge of disease and therapeutic regimen to maintain health will improve  Short Term Goals: Ability to remain free from injury will improve, Ability to verbalize frustration and anger appropriately will improve, Ability to demonstrate self-control, Ability to participate in decision  making will improve, Ability to verbalize feelings will improve, Ability to disclose and discuss suicidal ideas, Ability to identify and develop effective coping behaviors will improve, and Compliance with prescribed medications will improve  Medication Management: RN will administer medications as ordered by provider, will assess and evaluate patient's response and provide education to patient for prescribed medication. RN will report any adverse and/or side effects to prescribing provider.  Therapeutic Interventions: 1 on 1 counseling sessions, Psychoeducation, Medication administration, Evaluate responses to treatment, Monitor vital signs and CBGs as ordered, Perform/monitor CIWA, COWS, AIMS and Fall Risk screenings as ordered, Perform wound care treatments as ordered.  Evaluation of Outcomes: Not Met   LCSW Treatment Plan for Primary Diagnosis: MDD (major depressive disorder) Long Term Goal(s): Safe transition to appropriate next level of care at discharge, Engage patient in therapeutic group addressing interpersonal concerns.  Short Term Goals: Engage patient in aftercare planning with referrals and resources, Increase social support, Increase ability to appropriately verbalize feelings, Increase emotional regulation, Facilitate acceptance of mental health diagnosis and concerns, Facilitate patient progression through stages of change regarding substance use diagnoses and concerns, Identify triggers associated with mental health/substance abuse issues, and Increase skills for wellness and recovery  Therapeutic Interventions: Assess for all discharge needs, 1 to 1 time with Social worker, Explore available resources and support systems, Assess for adequacy in community support network, Educate family and significant other(s) on suicide prevention, Complete Psychosocial Assessment, Interpersonal group therapy.  Evaluation of Outcomes: Not Met   Progress in Treatment: Attending groups:  No. Participating in groups: No. Taking medication as prescribed: Yes. Toleration medication: Yes. Family/Significant other contact made: No, will contact:  wife, Khambrel Balent. Patient understands diagnosis: Yes. Discussing patient identified problems/goals with staff: Yes. Medical problems stabilized or resolved: Yes. Denies suicidal/homicidal ideation: Yes. Issues/concerns per patient self-inventory: No. Other: none.  New problem(s) identified: No, Describe:  none identified.  New Short Term/Long Term Goal(s): detox, elimination of symptoms of psychosis, medication management for mood stabilization; elimination of SI thoughts; development of comprehensive mental wellness/sobriety plan.  Patient Goals:  "Getting my mind back the way it used to be. I've fell into a full blown depression stage."  Discharge Plan or Barriers: CSW will assist pt with development of an appropriate aftercare/discharge plan.   Reason for Continuation of Hospitalization: Depression Medication stabilization Suicidal ideation  Estimated Length of Stay: 1-7 days  Last 3 [place] Suicide Severity Risk Score: Flowsheet Row Admission (Current) from 02/17/2023 in [facility] ED from [facility]/16/2023 in [facility]) [facility]  C-SSRS RISK CATEGORY High Risk High Risk High Risk       Last PHQ 2/9 Scores:    02/17/2020   11:16 AM  Depression screen PHQ 2/9  Decreased Interest 0  Down, Depressed, Hopeless 0  PHQ - 2 Score 0    Scribe for Treatment Team: Glenis Smoker, LCSW 02/19/2023 9:14 AM

## 2023-02-19 NOTE — Progress Notes (Addendum)
Patient ID: Joshua Lewis, male   DOB: [birth year], 62 y.o.   MRN: [id number] Pt states '' I'm in a depression. My family has moved away from me and are headed back up [place] and I have not been doing well. I need to get myself sorted out before I can go back and live with them. ''  Patient shares he has been struggling with depression and substance use. Denies any SI HI or AV Hallucinations currently. Has been visible on the unit eating and drinking well and able to make his needs known. Pt has been attending group. Pt denies any other acute concerns other than '' getting back on my meds for depression. '' Pt completed self inventory form and rates depression, hopelessness at 7/10 on scale 0 being none 10 being worst. Pt rates anxiety at 6/10 on scale 10 being worst - 0 being none. Pt did not rate goal for today. Pt compliant with medications. Pt is safe, will con't to monitor.

## 2023-02-19 NOTE — Group Note (Signed)
[facility] LCSW Group Therapy Note    Group Date: 02/19/2023 Start Time: 1300 End Time: 1350  Type of Therapy and Topic:  Group Therapy:  Overcoming Obstacles  Participation Level:  [facility] PARTICIPATION LEVEL: Minimal   Description of Group:   In this group patients will be encouraged to explore what they see as obstacles to their own wellness and recovery. They will be guided to discuss their thoughts, feelings, and behaviors related to these obstacles. The group will process together ways to cope with barriers, with attention given to specific choices patients can make. Each patient will be challenged to identify changes they are motivated to make in order to overcome their obstacles. This group will be process-oriented, with patients participating in exploration of their own experiences as well as giving and receiving support and challenge from other group members.  Therapeutic Goals: 1. Patient will identify personal and current obstacles as they relate to admission. 2. Patient will identify barriers that currently interfere with their wellness or overcoming obstacles.  3. Patient will identify feelings, thought process and behaviors related to these barriers. 4. Patient will identify two changes they are willing to make to overcome these obstacles:    Summary of Patient Progress Patient was present for the majority of the group process. He identified failure and fear as obstacles that he needs to overcome. Although pt did not answer questions further he did appear to attend to the conversation. Pt appears to have some insight into the topic. He appeared open and receptive to feedback/comments from both peers and facilitator.    Therapeutic Modalities:   Cognitive Behavioral Therapy Solution Focused Therapy Motivational Interviewing Relapse Prevention Therapy   Glenis Smoker, LCSW

## 2023-02-19 NOTE — [facility] Group Notes (Signed)
[facility] Group Notes:  (Nursing/MHT/Case Management/Adjunct)  Date:  02/19/2023  Time:  10:16 AM  Type of Therapy:  Psychoeducational Skills  Participation Level:  Active  Participation Quality:  Appropriate  Affect:  Appropriate  Cognitive:  Appropriate  Insight:  Good  Engagement in Group:  Engaged  Modes of Intervention:  Discussion, Education, and Exploration  Summary of Progress/Problems:  The patient was educated on positive reframing, and how this can impact mood and behavioral patterns. The patients were read two poems and asked to identify negative behavioral patterns that impact their mental health they would like to change. Pt shared and was appropriate.   Malva Limes 02/19/2023, 10:16 AM

## 2023-02-19 NOTE — Group Note (Signed)
Date:  02/19/2023 Time:  6:36 PM  Group Topic/Focus:  Group Activity      Participation Level:  Did Not Attend  Joshua Lewis 02/19/2023, 6:36 PM

## 2023-02-19 NOTE — Progress Notes (Signed)
Patient alert and oriented x 4, affect is bright, he denies SI/HI/AVH, interacting appropriately with peers and staff. Patient refused night time Seroquel he started "I don't need tonight sleeping just fine". Patient was offered emotional support and encouragement, 15 minutes safety checks maintained will continue to monitor.

## 2023-02-19 NOTE — Group Note (Signed)
Date:  02/19/2023 Time:  8:45 PM  Group Topic/Focus:  Wrap-Up Group:   The focus of this group is to help patients review their daily goal of treatment and discuss progress on daily workbooks.    Participation Level:  Active  Participation Quality:  Appropriate and Attentive  Affect:  Appropriate  Cognitive:  Alert and Appropriate  Insight: Good  Engagement in Group:  Developing/Improving and Engaged  Modes of Intervention:  Discussion and Limit-setting  Additional Comments:     Dal Blew 02/19/2023, 8:45 PM

## 2023-02-19 NOTE — Progress Notes (Signed)
[facility] MD Progress Note  02/19/2023 3:55 PM Joshua Lewis  MRN:  [id number] Subjective: Follow-up this patient with depression and substance abuse.  Patient seen and he attended treatment team.  62 year old man with a history of substance abuse problems.  Came to the hospital with severe depression and suicidal thoughts.  He says that his family has moved up [place] and left him down here and telling him that he cannot be around them until he gets his substance abuse under control.  Has been cutting himself recently.  Today the patient still endorses being depressed and feeling somewhat hopeless.  Denies suicidal intent in the hospital.  One complaint today is ongoing lower back pain related possibly to his history of renal cancer. Principal Problem: MDD (major depressive disorder) Diagnosis: Principal Problem:   MDD (major depressive disorder) Active Problems:   Cocaine abuse with cocaine-induced mood disorder (HCC)  Total Time spent with patient: 30 minutes  Past Psychiatric History: Past history of substance abuse problems with short periods of sobriety  Past Medical History:  Past Medical History:  Diagnosis Date   Chronic back pain greater than 3 months duration    Kidney carcinoma [facility])     Past Surgical History:  Procedure Laterality Date   ESOPHAGOGASTRODUODENOSCOPY (EGD) WITH PROPOFOL Left 08/09/2020   Procedure: ESOPHAGOGASTRODUODENOSCOPY (EGD) WITH PROPOFOL;  Surgeon: Vida Rigger, MD;  Location: [facility] ENDOSCOPY;  Service: Endoscopy;  Laterality: Left;   HAND SURGERY     HERNIA REPAIR     NEPHRECTOMY     right   Family History:  Family History  Problem Relation Age of Onset   Cancer Mother    Diabetes Mother    Hypertension Mother    Family Psychiatric  History: See previous Social History:  Social History   Substance and Sexual Activity  Alcohol Use Yes   Alcohol/week: 10.0 standard drinks of alcohol   Types: 10 Cans of beer per week     Social History   Substance and  Sexual Activity  Drug Use Yes   Frequency: 1.0 times per week   Types: Cocaine    Social History   Socioeconomic History   Marital status: Married    Spouse name: Not on file   Number of children: 2   Years of education: Not on file   Highest education level: Not on file  Occupational History   Not on file  Tobacco Use   Smoking status: Some Days    Packs/day: 0.50    Years: 25.00    Additional pack years: 0.00    Total pack years: 12.50    Types: Cigarettes   Smokeless tobacco: Never  Vaping Use   Vaping Use: Never used  Substance and Sexual Activity   Alcohol use: Yes    Alcohol/week: 10.0 standard drinks of alcohol    Types: 10 Cans of beer per week   Drug use: Yes    Frequency: 1.0 times per week    Types: Cocaine   Sexual activity: Yes  Other Topics Concern   Not on file  Social History Narrative   Not on file   Social Determinants of Health   Financial Resource Strain: Not on file  Food Insecurity: Food Insecurity Present (02/18/2023)   Hunger Vital Sign    Worried About Running Out of Food in the Last Year: Sometimes true    Ran Out of Food in the Last Year: Sometimes true  Transportation Needs: Unmet Transportation Needs (02/18/2023)   PRAPARE - Transportation  Lack of Transportation (Medical): Yes    Lack of Transportation (Non-Medical): Yes  Physical Activity: Not on file  Stress: Not on file  Social Connections: Not on file   Additional Social History:                         Sleep: Fair  Appetite:  Fair  Current Medications: Current Facility-Administered Medications  Medication Dose Route Frequency Provider Last Rate Last Admin   acetaminophen (TYLENOL) tablet 650 mg  650 mg Oral Q4H PRN Eligha Bridegroom, NP       alum & mag hydroxide-simeth (MAALOX/MYLANTA) 200-200-20 MG/5ML suspension 30 mL  30 mL Oral Q6H PRN Eligha Bridegroom, NP       diphenhydrAMINE (BENADRYL) capsule 50 mg  50 mg Oral TID PRN Eligha Bridegroom, NP       Or    diphenhydrAMINE (BENADRYL) injection 50 mg  50 mg Intramuscular TID PRN Eligha Bridegroom, NP       escitalopram (LEXAPRO) tablet 10 mg  10 mg Oral Daily Malik, Kashif   10 mg at 02/19/23 1610   haloperidol (HALDOL) tablet 5 mg  5 mg Oral TID PRN Eligha Bridegroom, NP       Or   haloperidol lactate (HALDOL) injection 5 mg  5 mg Intramuscular TID PRN Eligha Bridegroom, NP       hydrOXYzine (ATARAX) tablet 25 mg  25 mg Oral TID PRN Eligha Bridegroom, NP       ibuprofen (ADVIL) tablet 600 mg  600 mg Oral Q6H PRN Candiace West, Jackquline Denmark, MD       LORazepam (ATIVAN) tablet 2 mg  2 mg Oral TID PRN Eligha Bridegroom, NP       Or   LORazepam (ATIVAN) injection 2 mg  2 mg Intramuscular TID PRN Eligha Bridegroom, NP       magnesium hydroxide (MILK OF MAGNESIA) suspension 30 mL  30 mL Oral Daily PRN Eligha Bridegroom, NP       nicotine (NICODERM CQ - dosed in mg/24 hours) patch 14 mg  14 mg Transdermal Daily Thaxton Pelley T, MD   14 mg at 02/19/23 0909   QUEtiapine (SEROQUEL XR) 24 hr tablet 50 mg  50 mg Oral QHS Eligha Bridegroom, NP   50 mg at 02/17/23 2233    Lab Results: No results found for this or any previous visit (from the past 48 hour(s)).  Blood Alcohol level:  Lab Results  Component Value Date   ETH <10 02/16/2023   ETH <10 08/22/2021    Metabolic Disorder Labs: No results found for: "HGBA1C", "MPG" No results found for: "PROLACTIN" Lab Results  Component Value Date   CHOL 204 (H) 01/28/2020   TRIG 68 01/28/2020   HDL 36 (L) 01/28/2020   CHOLHDL 5.7 01/28/2020   VLDL 14 01/28/2020   LDLCALC 154 (H) 01/28/2020    Physical Findings: AIMS:  , ,  ,  ,    CIWA:    COWS:     Musculoskeletal: Strength & Muscle Tone: within normal limits Gait & Station: normal Patient leans: N/A  Psychiatric Specialty Exam:  Presentation  General Appearance:  Appropriate for Environment; Casual; Neat  Eye Contact: Poor  Speech: Normal Rate  Speech Volume: Decreased  Handedness: Right   Mood  and Affect  Mood: Anxious; Depressed  Affect: Flat   Thought Process  Thought Processes: Coherent  Descriptions of Associations:Intact  Orientation:Full (Time, Place and Person)  Thought Content:Logical  History of Schizophrenia/Schizoaffective  disorder:No data recorded Duration of Psychotic Symptoms:No data recorded Hallucinations:Hallucinations: None  Ideas of Reference:None  Suicidal Thoughts:Suicidal Thoughts: Yes, Passive SI Passive Intent and/or Plan: Without Intent; Without Means to Carry Out  Homicidal Thoughts:No data recorded  Sensorium  Memory: Immediate Fair; Remote Good  Judgment: Fair  Insight: Fair   Chartered certified accountant: Fair  Attention Span: Fair  Recall: [employer] of Knowledge: Fair  Language: Fair   Psychomotor Activity  Psychomotor Activity: Psychomotor Activity: Psychomotor Retardation   Assets  Assets: Communication Skills; Desire for Improvement; Financial Resources/Insurance; Social Support   Sleep  Sleep: Sleep: Fair    Physical Exam: Physical Exam Vitals and nursing note reviewed.  Constitutional:      Appearance: Normal appearance.  HENT:     Head: Normocephalic and atraumatic.     Mouth/Throat:     Pharynx: Oropharynx is clear.  Eyes:     Pupils: Pupils are equal, round, and reactive to light.  Cardiovascular:     Rate and Rhythm: Normal rate and regular rhythm.  Pulmonary:     Effort: Pulmonary effort is normal.     Breath sounds: Normal breath sounds.  Abdominal:     General: Abdomen is flat.     Palpations: Abdomen is soft.  Musculoskeletal:        General: Normal range of motion.  Skin:    General: Skin is warm and dry.  Neurological:     General: No focal deficit present.     Mental Status: He is alert. Mental status is at baseline.  Psychiatric:        Attention and Perception: He is inattentive.        Mood and Affect: Mood is depressed.        Speech: Speech is  delayed.        Behavior: Behavior is slowed.        Thought Content: Thought content includes suicidal ideation.    Review of Systems  Constitutional: Negative.   HENT: Negative.    Eyes: Negative.   Respiratory: Negative.    Cardiovascular: Negative.   Gastrointestinal: Negative.   Musculoskeletal: Negative.   Skin: Negative.   Neurological: Negative.   Psychiatric/Behavioral:  Positive for depression and suicidal ideas. The patient is nervous/anxious.    Blood pressure (!) 116/93, pulse 80, temperature 98.1 F (36.7 C), temperature source Oral, resp. rate 17, height 5\' 9"  (1.753 m), weight 67.6 kg, SpO2 97 %. Body mass index is 22 kg/m.   Treatment Plan Summary: Plan patient has been started on antidepressant medication.  Vital signs stable.  Labs largely unremarkable.  We will recheck hemoglobin A1c and lipid panel tomorrow.  Encourage patient to engage in groups.  Encourage patient to talk with staff about possibilities for rehab if that is what he wants to look into.  Mordecai Rasmussen, MD 02/19/2023, 3:55 PM

## 2023-02-20 DIAGNOSIS — F332 Major depressive disorder, recurrent severe without psychotic features: Secondary | ICD-10-CM | POA: Diagnosis not present

## 2023-02-20 NOTE — Progress Notes (Signed)
[facility] MD Progress Note  02/20/2023 10:04 AM Joshua Lewis  MRN:  [id number] Subjective: Follow-up for this patient with depression and substance use issues.  Patient seen and chart reviewed.  Patient continues to describe himself as feeling very depressed and hopeless.  He spoke once again about how his wife and children have moved back to [place] and refused to take him with them saying that he needed to get straight before they could deal with them.  Patient is left homeless and with few resources.  No suicidal intent in the hospital.  Still low energy.  Gets up and interacts a little bit usually in the afternoon. Principal Problem: MDD (major depressive disorder) Diagnosis: Principal Problem:   MDD (major depressive disorder) Active Problems:   Cocaine abuse with cocaine-induced mood disorder (HCC)  Total Time spent with patient: 30 minutes  Past Psychiatric History: Past history of depression and substance use issues  Past Medical History:  Past Medical History:  Diagnosis Date   Chronic back pain greater than 3 months duration    Kidney carcinoma [facility])     Past Surgical History:  Procedure Laterality Date   ESOPHAGOGASTRODUODENOSCOPY (EGD) WITH PROPOFOL Left 08/09/2020   Procedure: ESOPHAGOGASTRODUODENOSCOPY (EGD) WITH PROPOFOL;  Surgeon: Vida Rigger, MD;  Location: [facility] ENDOSCOPY;  Service: Endoscopy;  Laterality: Left;   HAND SURGERY     HERNIA REPAIR     NEPHRECTOMY     right   Family History:  Family History  Problem Relation Age of Onset   Cancer Mother    Diabetes Mother    Hypertension Mother    Family Psychiatric  History: See previous Social History:  Social History   Substance and Sexual Activity  Alcohol Use Yes   Alcohol/week: 10.0 standard drinks of alcohol   Types: 10 Cans of beer per week     Social History   Substance and Sexual Activity  Drug Use Yes   Frequency: 1.0 times per week   Types: Cocaine    Social History   Socioeconomic History    Marital status: Married    Spouse name: Not on file   Number of children: 2   Years of education: Not on file   Highest education level: Not on file  Occupational History   Not on file  Tobacco Use   Smoking status: Some Days    Packs/day: 0.50    Years: 25.00    Additional pack years: 0.00    Total pack years: 12.50    Types: Cigarettes   Smokeless tobacco: Never  Vaping Use   Vaping Use: Never used  Substance and Sexual Activity   Alcohol use: Yes    Alcohol/week: 10.0 standard drinks of alcohol    Types: 10 Cans of beer per week   Drug use: Yes    Frequency: 1.0 times per week    Types: Cocaine   Sexual activity: Yes  Other Topics Concern   Not on file  Social History Narrative   Not on file   Social Determinants of Health   Financial Resource Strain: Not on file  Food Insecurity: Food Insecurity Present (02/18/2023)   Hunger Vital Sign    Worried About Running Out of Food in the Last Year: Sometimes true    Ran Out of Food in the Last Year: Sometimes true  Transportation Needs: Unmet Transportation Needs (02/18/2023)   PRAPARE - Administrator, Civil Service (Medical): Yes    Lack of Transportation (Non-Medical): Yes  Physical Activity:  Not on file  Stress: Not on file  Social Connections: Not on file   Additional Social History:                         Sleep: Fair  Appetite:  Fair  Current Medications: Current Facility-Administered Medications  Medication Dose Route Frequency Provider Last Rate Last Admin   acetaminophen (TYLENOL) tablet 650 mg  650 mg Oral Q4H PRN Eligha Bridegroom, NP       alum & mag hydroxide-simeth (MAALOX/MYLANTA) 200-200-20 MG/5ML suspension 30 mL  30 mL Oral Q6H PRN Eligha Bridegroom, NP       diphenhydrAMINE (BENADRYL) capsule 50 mg  50 mg Oral TID PRN Eligha Bridegroom, NP       Or   diphenhydrAMINE (BENADRYL) injection 50 mg  50 mg Intramuscular TID PRN Eligha Bridegroom, NP       escitalopram (LEXAPRO) tablet  10 mg  10 mg Oral Daily Malik, Kashif   10 mg at 02/20/23 1610   haloperidol (HALDOL) tablet 5 mg  5 mg Oral TID PRN Eligha Bridegroom, NP       Or   haloperidol lactate (HALDOL) injection 5 mg  5 mg Intramuscular TID PRN Eligha Bridegroom, NP       hydrOXYzine (ATARAX) tablet 25 mg  25 mg Oral TID PRN Eligha Bridegroom, NP       ibuprofen (ADVIL) tablet 600 mg  600 mg Oral Q6H PRN Consuelo Thayne, Jackquline Denmark, MD       LORazepam (ATIVAN) tablet 2 mg  2 mg Oral TID PRN Eligha Bridegroom, NP       Or   LORazepam (ATIVAN) injection 2 mg  2 mg Intramuscular TID PRN Eligha Bridegroom, NP       magnesium hydroxide (MILK OF MAGNESIA) suspension 30 mL  30 mL Oral Daily PRN Eligha Bridegroom, NP       nicotine (NICODERM CQ - dosed in mg/24 hours) patch 14 mg  14 mg Transdermal Daily Kassey Laforest T, MD   14 mg at 02/20/23 0740   QUEtiapine (SEROQUEL XR) 24 hr tablet 50 mg  50 mg Oral QHS Eligha Bridegroom, NP   50 mg at 02/19/23 2112    Lab Results: No results found for this or any previous visit (from the past 48 hour(s)).  Blood Alcohol level:  Lab Results  Component Value Date   ETH <10 02/16/2023   ETH <10 08/22/2021    Metabolic Disorder Labs: No results found for: "HGBA1C", "MPG" No results found for: "PROLACTIN" Lab Results  Component Value Date   CHOL 204 (H) 01/28/2020   TRIG 68 01/28/2020   HDL 36 (L) 01/28/2020   CHOLHDL 5.7 01/28/2020   VLDL 14 01/28/2020   LDLCALC 154 (H) 01/28/2020    Physical Findings: AIMS:  , ,  ,  ,    CIWA:    COWS:     Musculoskeletal: Strength & Muscle Tone: within normal limits Gait & Station: normal Patient leans: N/A  Psychiatric Specialty Exam:  Presentation  General Appearance:  Appropriate for Environment; Casual; Neat  Eye Contact: Poor  Speech: Normal Rate  Speech Volume: Decreased  Handedness: Right   Mood and Affect  Mood: Anxious; Depressed  Affect: Flat   Thought Process  Thought Processes: Coherent  Descriptions of  Associations:Intact  Orientation:Full (Time, Place and Person)  Thought Content:Logical  History of Schizophrenia/Schizoaffective disorder:No data recorded Duration of Psychotic Symptoms:No data recorded Hallucinations:No data recorded Ideas of Reference:None  Suicidal Thoughts:No data recorded Homicidal Thoughts:No data recorded  Sensorium  Memory: Immediate Fair; Remote Good  Judgment: Fair  Insight: Fair   Chartered certified accountant: Fair  Attention Span: Fair  Recall: [employer] of Knowledge: Fair  Language: Fair   Psychomotor Activity  Psychomotor Activity:No data recorded  Assets  Assets: Communication Skills; Desire for Improvement; Financial Resources/Insurance; Social Support   Sleep  Sleep:No data recorded   Physical Exam: Physical Exam Vitals and nursing note reviewed.  Constitutional:      Appearance: Normal appearance.  HENT:     Head: Normocephalic and atraumatic.     Mouth/Throat:     Pharynx: Oropharynx is clear.  Eyes:     Pupils: Pupils are equal, round, and reactive to light.  Cardiovascular:     Rate and Rhythm: Normal rate and regular rhythm.  Pulmonary:     Effort: Pulmonary effort is normal.     Breath sounds: Normal breath sounds.  Abdominal:     General: Abdomen is flat.     Palpations: Abdomen is soft.  Musculoskeletal:        General: Normal range of motion.  Skin:    General: Skin is warm and dry.  Neurological:     General: No focal deficit present.     Mental Status: He is alert. Mental status is at baseline.  Psychiatric:        Attention and Perception: Attention normal.        Mood and Affect: Mood is depressed. Affect is blunt.        Speech: Speech is delayed.        Behavior: Behavior is slowed.        Thought Content: Thought content includes suicidal ideation. Thought content does not include suicidal plan.    Review of Systems  Constitutional: Negative.   HENT: Negative.    Eyes:  Negative.   Respiratory: Negative.    Cardiovascular: Negative.   Gastrointestinal: Negative.   Musculoskeletal: Negative.   Skin: Negative.   Neurological: Negative.   Psychiatric/Behavioral:  Positive for depression and suicidal ideas. The patient is nervous/anxious.    Blood pressure (!) 125/96, pulse 70, temperature (!) 97.5 F (36.4 C), temperature source Oral, resp. rate 20, height 5\' 9"  (1.753 m), weight 67.6 kg, SpO2 98 %. Body mass index is 22 kg/m.   Treatment Plan Summary: Medication management and Plan patient has been started on antidepressant.  Cooperative with medicine.  Physically blood pressure slightly elevated but vitals largely otherwise unremarkable.  No new physical complaints.  Offered supportive therapy and encouragement to the patient.  Briefly reviewed some of the difficulties we may have been finding longer-term substance use treatment but encouraged him to speak with social work about it.  Encouraged him to attend groups.  No change to medicine indicated for today.  Ongoing assessment daily.  Mordecai Rasmussen, MD 02/20/2023, 10:04 AM

## 2023-02-20 NOTE — Progress Notes (Signed)
Patient calm and pleasant during assessment denying SI/HI/AVH, endorses depression and anxiety. Pt observed interacting appropriately with staff and peers on the unit. Pt compliant with medication administration per MD orders. Pt given education, support, and encouragement to be active in his treatment plan. Pt being monitored Q 15 minutes for safety per unit protocol, remains safe on the unit.

## 2023-02-20 NOTE — [facility] Group Notes (Signed)
[facility] Group Notes:  (Nursing/MHT/Case Management/Adjunct)  Date:  02/20/2023  [id number]  Type of Therapy:  Psychoeducational Skills  Participation Level:  Did Not Attend  Participation Quality:   na  Affect:   na  Cognitive:   na  Insight:  None  Engagement in Group:   na  Modes of Intervention:   na  Summary of Progress/Problems:  Malva Limes 02/20/2023, 2:59 PM

## 2023-02-20 NOTE — Progress Notes (Signed)
Patient ID: Joshua Lewis, male   DOB: 01/18/1961, 62 y.o.   MRN: [id number] Pt presents with depressed mood, affect congruent. Joshua Lewis reports he slept well but continues to '' feel very depressed and down.'' He continues to ruminate over losing his family and that he does not have much support here, expressing desire to reconnect in [place]. He reports good sleep. Completed self inventory form and rates his depression anxiety and hopelessness all consistently at 8/10 on scale 10 being worst 0 being none. He reports continued passive SI at times but voices ability to be safe in hospital.  Pt is safe, compliant with am medications. Will con't to monitor.

## 2023-02-21 DIAGNOSIS — F332 Major depressive disorder, recurrent severe without psychotic features: Secondary | ICD-10-CM | POA: Diagnosis not present

## 2023-02-21 LAB — LIPID PANEL
Cholesterol: 171 mg/dL (ref 0–200)
HDL: 53 mg/dL (ref >40)
LDL Cholesterol: 107 mg/dL — ABNORMAL HIGH (ref 0–99)
Total CHOL/HDL Ratio: 3.2 RATIO
Triglycerides: 54 mg/dL (ref <150)
VLDL: 11 mg/dL (ref 0–40)

## 2023-02-21 LAB — HEMOGLOBIN A1C
Hgb A1c MFr Bld: 5.6 % (ref 4.8–5.6)
Mean Plasma Glucose: 114.02 mg/dL

## 2023-02-21 NOTE — Progress Notes (Signed)
Pt has been pleasant and cooperative today. Pt has attended groups and is medication compliant. Pt denies active s/I and verbally contracts for safety. Pt rates depression at 8/10 today.

## 2023-02-21 NOTE — Group Note (Signed)
[facility] LCSW Group Therapy Note   Group Date: 02/21/2023 Start Time: 1310 End Time: 1420   Type of Therapy/Topic:  Group Therapy:  Emotion Regulation  Participation Level:  Minimal   Mood:  Description of Group:    The purpose of this group is to assist patients in learning to regulate negative emotions and experience positive emotions. Patients will be guided to discuss ways in which they have been vulnerable to their negative emotions. These vulnerabilities will be juxtaposed with experiences of positive emotions or situations, and patients challenged to use positive emotions to combat negative ones. Special emphasis will be placed on coping with negative emotions in conflict situations, and patients will process healthy conflict resolution skills.  Therapeutic Goals: Patient will identify two positive emotions or experiences to reflect on in order to balance out negative emotions:  Patient will label two or more emotions that they find the most difficult to experience:  Patient will be able to demonstrate positive conflict resolution skills through discussion or role plays:   Summary of Patient Progress: Patient was present for the majority of the group process. Although he was not actively involved in the discussion he did appear to attend to it. When pt did make comments they were appropriate and on topic. He appeared open and receptive to feedback/comments from both peers and facilitator.    Therapeutic Modalities:   Cognitive Behavioral Therapy Feelings Identification Dialectical Behavioral Therapy   Glenis Smoker, LCSW

## 2023-02-21 NOTE — Plan of Care (Signed)
Patient was visible in the milieu and currently in bed. Calm and cooperative. Denied hallucinations. Admits to feeling depressed and sad but improving.  Received bed time medications and snack. Encouragements and support provided.

## 2023-02-21 NOTE — Plan of Care (Signed)
D- Patient alert and oriented. Affect/mood. Denies SI, HI, AVH,Pt is pleasant and cooperative.  A- Scheduled medications administered to patient, per MD orders. Support and encouragement provided.  Routine safety checks conducted every 15 minutes.  Patient informed to notify staff with problems or concerns.  R- No adverse drug reactions noted. Patient contracts for safety at this time. Patient compliant with medications and treatment plan. Patient receptive, calm, and cooperative. Patient interacts well with others on the unit.  Patient remains safe at this time.

## 2023-02-21 NOTE — Progress Notes (Signed)
Pt complains of feeling anxious.Pt stated his family has returned to [place] and left him here. Pt stated he does not know how he will get home. Pt requested and received prn vistaril 25 mg po for anxiety.

## 2023-02-21 NOTE — Progress Notes (Signed)
[facility] MD Progress Note  02/21/2023 11:24 AM Joshua Lewis  MRN:  [id number] Subjective: Patient seen and chart reviewed.  62 year old man with a history of depression.  Patient reports he is feeling a little bit better.  Not having any current suicidal intent but still feeling very anxious and hopeless.  Taking better care of himself getting out of his room more interacting more. Principal Problem: MDD (major depressive disorder) Diagnosis: Principal Problem:   MDD (major depressive disorder) Active Problems:   Cocaine abuse with cocaine-induced mood disorder (HCC)  Total Time spent with patient: 30 minutes  Past Psychiatric History: Past history of depression and substance abuse and medical issues  Past Medical History:  Past Medical History:  Diagnosis Date   Chronic back pain greater than 3 months duration    Kidney carcinoma [facility])     Past Surgical History:  Procedure Laterality Date   ESOPHAGOGASTRODUODENOSCOPY (EGD) WITH PROPOFOL Left 08/09/2020   Procedure: ESOPHAGOGASTRODUODENOSCOPY (EGD) WITH PROPOFOL;  Surgeon: Vida Rigger, MD;  Location: [facility] ENDOSCOPY;  Service: Endoscopy;  Laterality: Left;   HAND SURGERY     HERNIA REPAIR     NEPHRECTOMY     right   Family History:  Family History  Problem Relation Age of Onset   Cancer Mother    Diabetes Mother    Hypertension Mother    Family Psychiatric  History: See previous Social History:  Social History   Substance and Sexual Activity  Alcohol Use Yes   Alcohol/week: 10.0 standard drinks of alcohol   Types: 10 Cans of beer per week     Social History   Substance and Sexual Activity  Drug Use Yes   Frequency: 1.0 times per week   Types: Cocaine    Social History   Socioeconomic History   Marital status: Married    Spouse name: Not on file   Number of children: 2   Years of education: Not on file   Highest education level: Not on file  Occupational History   Not on file  Tobacco Use   Smoking status: Some  Days    Packs/day: 0.50    Years: 25.00    Additional pack years: 0.00    Total pack years: 12.50    Types: Cigarettes   Smokeless tobacco: Never  Vaping Use   Vaping Use: Never used  Substance and Sexual Activity   Alcohol use: Yes    Alcohol/week: 10.0 standard drinks of alcohol    Types: 10 Cans of beer per week   Drug use: Yes    Frequency: 1.0 times per week    Types: Cocaine   Sexual activity: Yes  Other Topics Concern   Not on file  Social History Narrative   Not on file   Social Determinants of Health   Financial Resource Strain: Not on file  Food Insecurity: Food Insecurity Present (02/18/2023)   Hunger Vital Sign    Worried About Running Out of Food in the Last Year: Sometimes true    Ran Out of Food in the Last Year: Sometimes true  Transportation Needs: Unmet Transportation Needs (02/18/2023)   PRAPARE - Administrator, Civil Service (Medical): Yes    Lack of Transportation (Non-Medical): Yes  Physical Activity: Not on file  Stress: Not on file  Social Connections: Not on file   Additional Social History:  Sleep: Fair  Appetite:  Fair  Current Medications: Current Facility-Administered Medications  Medication Dose Route Frequency Provider Last Rate Last Admin   acetaminophen (TYLENOL) tablet 650 mg  650 mg Oral Q4H PRN Eligha Bridegroom, NP       alum & mag hydroxide-simeth (MAALOX/MYLANTA) 200-200-20 MG/5ML suspension 30 mL  30 mL Oral Q6H PRN Eligha Bridegroom, NP       diphenhydrAMINE (BENADRYL) capsule 50 mg  50 mg Oral TID PRN Eligha Bridegroom, NP       Or   diphenhydrAMINE (BENADRYL) injection 50 mg  50 mg Intramuscular TID PRN Eligha Bridegroom, NP       escitalopram (LEXAPRO) tablet 10 mg  10 mg Oral Daily Malik, Kashif   10 mg at 02/21/23 1610   haloperidol (HALDOL) tablet 5 mg  5 mg Oral TID PRN Eligha Bridegroom, NP       Or   haloperidol lactate (HALDOL) injection 5 mg  5 mg Intramuscular TID PRN  Eligha Bridegroom, NP       hydrOXYzine (ATARAX) tablet 25 mg  25 mg Oral TID PRN Eligha Bridegroom, NP       ibuprofen (ADVIL) tablet 600 mg  600 mg Oral Q6H PRN Garlon Tuggle, Jackquline Denmark, MD       LORazepam (ATIVAN) tablet 2 mg  2 mg Oral TID PRN Eligha Bridegroom, NP       Or   LORazepam (ATIVAN) injection 2 mg  2 mg Intramuscular TID PRN Eligha Bridegroom, NP       magnesium hydroxide (MILK OF MAGNESIA) suspension 30 mL  30 mL Oral Daily PRN Eligha Bridegroom, NP       nicotine (NICODERM CQ - dosed in mg/24 hours) patch 14 mg  14 mg Transdermal Daily Aaliyah Cancro, Jackquline Denmark, MD   14 mg at 02/21/23 0818   QUEtiapine (SEROQUEL XR) 24 hr tablet 50 mg  50 mg Oral QHS Eligha Bridegroom, NP   50 mg at 02/20/23 2145    Lab Results:  Results for orders placed or performed during the hospital encounter of 02/17/23 (from the past 48 hour(s))  Lipid panel     Status: Abnormal   Collection Time: 02/21/23  7:05 AM  Result Value Ref Range   Cholesterol 171 0 - 200 mg/dL   Triglycerides 54 <960 mg/dL   HDL 53 >45 mg/dL   Total CHOL/HDL Ratio 3.2 RATIO   VLDL 11 0 - 40 mg/dL   LDL Cholesterol 409 (H) 0 - 99 mg/dL    Comment:        Total Cholesterol/HDL:CHD Risk Coronary Heart Disease Risk Table                     Men   Women  1/2 Average Risk   3.4   3.3  Average Risk       5.0   4.4  2 X Average Risk   9.6   7.1  3 X Average Risk  23.4   11.0        Use the calculated Patient Ratio above and the CHD Risk Table to determine the patient's CHD Risk.        ATP III CLASSIFICATION (LDL):  <100     mg/dL   Optimal  811-914  mg/dL   Near or Above                    Optimal  130-159  mg/dL   Borderline  782-956  mg/dL  High  >190     mg/dL   Very High Performed at [facility], [address] [place], [place] [address]     Blood Alcohol level:  Lab Results  Component Value Date   [facility] <10 02/16/2023   ETH <10 08/22/2021    Metabolic Disorder Labs: No results found for: "HGBA1C", "MPG" No  results found for: "PROLACTIN" Lab Results  Component Value Date   CHOL 171 02/21/2023   TRIG 54 02/21/2023   HDL 53 02/21/2023   CHOLHDL 3.2 02/21/2023   VLDL 11 02/21/2023   LDLCALC 107 (H) 02/21/2023   LDLCALC 154 (H) 01/28/2020    Physical Findings: AIMS:  , ,  ,  ,    CIWA:    COWS:     Musculoskeletal: Strength & Muscle Tone: within normal limits Gait & Station: normal Patient leans: N/A  Psychiatric Specialty Exam:  Presentation  General Appearance:  Appropriate for Environment; Casual; Neat  Eye Contact: Poor  Speech: Normal Rate  Speech Volume: Decreased  Handedness: Right   Mood and Affect  Mood: Anxious; Depressed  Affect: Flat   Thought Process  Thought Processes: Coherent  Descriptions of Associations:Intact  Orientation:Full (Time, Place and Person)  Thought Content:Logical  History of Schizophrenia/Schizoaffective disorder:No data recorded Duration of Psychotic Symptoms:No data recorded Hallucinations:No data recorded Ideas of Reference:None  Suicidal Thoughts:No data recorded Homicidal Thoughts:No data recorded  Sensorium  Memory: Immediate Fair; Remote Good  Judgment: Fair  Insight: Fair   Art therapist  Concentration: Fair  Attention Span: Fair  Recall: [employer] of Knowledge: Fair  Language: Fair   Psychomotor Activity  Psychomotor Activity:No data recorded  Assets  Assets: Communication Skills; Desire for Improvement; Financial Resources/Insurance; Social Support   Sleep  Sleep:No data recorded   Physical Exam: Physical Exam Vitals and nursing note reviewed.  Constitutional:      Appearance: Normal appearance.  HENT:     Head: Normocephalic and atraumatic.     Mouth/Throat:     Pharynx: Oropharynx is clear.  Eyes:     Pupils: Pupils are equal, round, and reactive to light.  Cardiovascular:     Rate and Rhythm: Normal rate and regular rhythm.  Pulmonary:     Effort:  Pulmonary effort is normal.     Breath sounds: Normal breath sounds.  Abdominal:     General: Abdomen is flat.     Palpations: Abdomen is soft.  Musculoskeletal:        General: Normal range of motion.  Skin:    General: Skin is warm and dry.  Neurological:     General: No focal deficit present.     Mental Status: He is alert. Mental status is at baseline.  Psychiatric:        Attention and Perception: Attention normal.        Mood and Affect: Mood is depressed.        Speech: Speech is delayed.        Behavior: Behavior is slowed.        Thought Content: Thought content normal.        Cognition and Memory: Cognition normal.    Review of Systems  Constitutional: Negative.   HENT: Negative.    Eyes: Negative.   Respiratory: Negative.    Cardiovascular: Negative.   Gastrointestinal: Negative.   Musculoskeletal: Negative.   Skin: Negative.   Neurological: Negative.   Psychiatric/Behavioral:  Positive for depression, substance abuse and suicidal ideas.    Blood pressure  119/69, pulse 69, temperature 97.9 F (36.6 C), temperature source Oral, resp. rate 20, height 5\' 9"  (1.753 m), weight 67.6 kg, SpO2 98 %. Body mass index is 22 kg/m.   Treatment Plan Summary: Plan no change to medication.  Supportive counseling and encouragement.  Patient said he thought he might be feeling better within the next few days and we might consider discharge by after the weekend.  He still has some interest in rehab treatment and social work is Catering manager with him about that.  Mordecai Rasmussen, MD 02/21/2023, 11:24 AM

## 2023-02-21 NOTE — Group Note (Signed)
Date:  02/21/2023 Time:  6:36 PM  Group Topic/Focus:  Activity Group    Participation Level:  Active  Participation Quality:  Appropriate  Affect:  Appropriate  Cognitive:  Appropriate  Insight: Appropriate  Engagement in Group:  Engaged  Modes of Intervention:  Activity  Additional Comments:    Mary Sella Teara Duerksen 02/21/2023, 6:36 PM

## 2023-02-22 DIAGNOSIS — F332 Major depressive disorder, recurrent severe without psychotic features: Secondary | ICD-10-CM | POA: Diagnosis not present

## 2023-02-22 MED ORDER — QUETIAPINE FUMARATE ER 50 MG PO TB24
100.0000 mg | ORAL_TABLET | Freq: Every day | ORAL | Status: DC
  Administered 2023-02-22 – 2023-03-04 (×7): 100 mg via ORAL
  Filled 2023-02-22 (×11): qty 2

## 2023-02-22 MED ORDER — ESCITALOPRAM OXALATE 10 MG PO TABS
15.0000 mg | ORAL_TABLET | Freq: Every day | ORAL | Status: DC
  Administered 2023-02-22 – 2023-02-26 (×5): 15 mg via ORAL
  Filled 2023-02-22 (×5): qty 2

## 2023-02-22 MED ORDER — ESCITALOPRAM OXALATE 10 MG PO TABS: 15.0000 mg | ORAL_TABLET | Freq: Every day | ORAL | Status: DC

## 2023-02-22 NOTE — Progress Notes (Signed)
[facility] MD Progress Note  02/22/2023 10:06 AM Joshua Lewis  MRN:  [id number] Subjective: Patient seen and chart reviewed.  Patient says he is coming along a little bit still feels depressed.  Still feels worried and negative about himself.  Stays in bed a lot not very interactive. Principal Problem: MDD (major depressive disorder) Diagnosis: Principal Problem:   MDD (major depressive disorder) Active Problems:   Cocaine abuse with cocaine-induced mood disorder (HCC)  Total Time spent with patient: 30 minutes  Past Psychiatric History: Past history of recurrent depression and substance use problems leading to worsening social discord  Past Medical History:  Past Medical History:  Diagnosis Date   Chronic back pain greater than 3 months duration    Kidney carcinoma [facility])     Past Surgical History:  Procedure Laterality Date   ESOPHAGOGASTRODUODENOSCOPY (EGD) WITH PROPOFOL Left 08/09/2020   Procedure: ESOPHAGOGASTRODUODENOSCOPY (EGD) WITH PROPOFOL;  Surgeon: Vida Rigger, MD;  Location: [facility] ENDOSCOPY;  Service: Endoscopy;  Laterality: Left;   HAND SURGERY     HERNIA REPAIR     NEPHRECTOMY     right   Family History:  Family History  Problem Relation Age of Onset   Cancer Mother    Diabetes Mother    Hypertension Mother    Family Psychiatric  History: See previous Social History:  Social History   Substance and Sexual Activity  Alcohol Use Yes   Alcohol/week: 10.0 standard drinks of alcohol   Types: 10 Cans of beer per week     Social History   Substance and Sexual Activity  Drug Use Yes   Frequency: 1.0 times per week   Types: Cocaine    Social History   Socioeconomic History   Marital status: Married    Spouse name: Not on file   Number of children: 2   Years of education: Not on file   Highest education level: Not on file  Occupational History   Not on file  Tobacco Use   Smoking status: Some Days    Packs/day: 0.50    Years: 25.00    Additional pack years:  0.00    Total pack years: 12.50    Types: Cigarettes   Smokeless tobacco: Never  Vaping Use   Vaping Use: Never used  Substance and Sexual Activity   Alcohol use: Yes    Alcohol/week: 10.0 standard drinks of alcohol    Types: 10 Cans of beer per week   Drug use: Yes    Frequency: 1.0 times per week    Types: Cocaine   Sexual activity: Yes  Other Topics Concern   Not on file  Social History Narrative   Not on file   Social Determinants of Health   Financial Resource Strain: Not on file  Food Insecurity: Food Insecurity Present (02/18/2023)   Hunger Vital Sign    Worried About Running Out of Food in the Last Year: Sometimes true    Ran Out of Food in the Last Year: Sometimes true  Transportation Needs: Unmet Transportation Needs (02/18/2023)   PRAPARE - Administrator, Civil Service (Medical): Yes    Lack of Transportation (Non-Medical): Yes  Physical Activity: Not on file  Stress: Not on file  Social Connections: Not on file   Additional Social History:                         Sleep: Fair  Appetite:  Fair  Current Medications: Current Facility-Administered Medications  Medication Dose Route Frequency Provider Last Rate Last Admin   acetaminophen (TYLENOL) tablet 650 mg  650 mg Oral Q4H PRN Eligha Bridegroom, NP       alum & mag hydroxide-simeth (MAALOX/MYLANTA) 200-200-20 MG/5ML suspension 30 mL  30 mL Oral Q6H PRN Eligha Bridegroom, NP       diphenhydrAMINE (BENADRYL) capsule 50 mg  50 mg Oral TID PRN Eligha Bridegroom, NP       Or   diphenhydrAMINE (BENADRYL) injection 50 mg  50 mg Intramuscular TID PRN Eligha Bridegroom, NP       escitalopram (LEXAPRO) tablet 10 mg  10 mg Oral Daily Malik, Kashif   10 mg at 02/21/23 1610   haloperidol (HALDOL) tablet 5 mg  5 mg Oral TID PRN Eligha Bridegroom, NP       Or   haloperidol lactate (HALDOL) injection 5 mg  5 mg Intramuscular TID PRN Eligha Bridegroom, NP       hydrOXYzine (ATARAX) tablet 25 mg  25 mg Oral  TID PRN Eligha Bridegroom, NP   25 mg at 02/21/23 1820   ibuprofen (ADVIL) tablet 600 mg  600 mg Oral Q6H PRN Pristine Gladhill, Jackquline Denmark, MD       LORazepam (ATIVAN) tablet 2 mg  2 mg Oral TID PRN Eligha Bridegroom, NP       Or   LORazepam (ATIVAN) injection 2 mg  2 mg Intramuscular TID PRN Eligha Bridegroom, NP       magnesium hydroxide (MILK OF MAGNESIA) suspension 30 mL  30 mL Oral Daily PRN Eligha Bridegroom, NP       nicotine (NICODERM CQ - dosed in mg/24 hours) patch 14 mg  14 mg Transdermal Daily Merril Nagy, Jackquline Denmark, MD   14 mg at 02/21/23 0818   QUEtiapine (SEROQUEL XR) 24 hr tablet 50 mg  50 mg Oral QHS Eligha Bridegroom, NP   50 mg at 02/21/23 2141    Lab Results:  Results for orders placed or performed during the hospital encounter of 02/17/23 (from the past 48 hour(s))  Lipid panel     Status: Abnormal   Collection Time: 02/21/23  7:05 AM  Result Value Ref Range   Cholesterol 171 0 - 200 mg/dL   Triglycerides 54 <960 mg/dL   HDL 53 >45 mg/dL   Total CHOL/HDL Ratio 3.2 RATIO   VLDL 11 0 - 40 mg/dL   LDL Cholesterol 409 (H) 0 - 99 mg/dL    Comment:        Total Cholesterol/HDL:CHD Risk Coronary Heart Disease Risk Table                     Men   Women  1/2 Average Risk   3.4   3.3  Average Risk       5.0   4.4  2 X Average Risk   9.6   7.1  3 X Average Risk  23.4   11.0        Use the calculated Patient Ratio above and the CHD Risk Table to determine the patient's CHD Risk.        ATP III CLASSIFICATION (LDL):  <100     mg/dL   Optimal  811-914  mg/dL   Near or Above                    Optimal  130-159  mg/dL   Borderline  782-956  mg/dL   High  >213     mg/dL  Very High Performed at [facility], [address] [place], [place] [address]   Hemoglobin A1c     Status: None   Collection Time: 02/21/23  7:05 AM  Result Value Ref Range   Hgb A1c MFr Bld 5.6 4.8 - 5.6 %    Comment: (NOTE) Pre diabetes:          5.7%-6.4%  Diabetes:              >6.4%  Glycemic control  for   <7.0% adults with diabetes    Mean Plasma Glucose 114.02 mg/dL    Comment: Performed at [facility], [address] [place], [place] [address]    Blood Alcohol level:  Lab Results  Component Value Date   [facility] <10 02/16/2023   ETH <10 08/22/2021    Metabolic Disorder Labs: Lab Results  Component Value Date   HGBA1C 5.6 02/21/2023   MPG 114.02 02/21/2023   No results found for: "PROLACTIN" Lab Results  Component Value Date   CHOL 171 02/21/2023   TRIG 54 02/21/2023   HDL 53 02/21/2023   CHOLHDL 3.2 02/21/2023   VLDL 11 02/21/2023   LDLCALC 107 (H) 02/21/2023   LDLCALC 154 (H) 01/28/2020    Physical Findings: AIMS:  , ,  ,  ,    CIWA:    COWS:     Musculoskeletal: Strength & Muscle Tone: within normal limits Gait & Station: normal Patient leans: N/A  Psychiatric Specialty Exam:  Presentation  General Appearance:  Appropriate for Environment; Casual; Neat  Eye Contact: Poor  Speech: Normal Rate  Speech Volume: Decreased  Handedness: Right   Mood and Affect  Mood: Anxious; Depressed  Affect: Flat   Thought Process  Thought Processes: Coherent  Descriptions of Associations:Intact  Orientation:Full (Time, Place and Person)  Thought Content:Logical  History of Schizophrenia/Schizoaffective disorder:No data recorded Duration of Psychotic Symptoms:No data recorded Hallucinations:No data recorded Ideas of Reference:None  Suicidal Thoughts:No data recorded Homicidal Thoughts:No data recorded  Sensorium  Memory: Immediate Fair; Remote Good  Judgment: Fair  Insight: Fair   Chartered certified accountant: Fair  Attention Span: Fair  Recall: [employer] of Knowledge: Fair  Language: Fair   Psychomotor Activity  Psychomotor Activity:No data recorded  Assets  Assets: Communication Skills; Desire for Improvement; Financial Resources/Insurance; Social Support   Sleep  Sleep:No data  recorded   Physical Exam: Physical Exam Vitals reviewed.  Constitutional:      Appearance: Normal appearance.  HENT:     Head: Normocephalic and atraumatic.     Mouth/Throat:     Pharynx: Oropharynx is clear.  Eyes:     Pupils: Pupils are equal, round, and reactive to light.  Cardiovascular:     Rate and Rhythm: Normal rate and regular rhythm.  Pulmonary:     Effort: Pulmonary effort is normal.     Breath sounds: Normal breath sounds.  Abdominal:     General: Abdomen is flat.     Palpations: Abdomen is soft.  Musculoskeletal:        General: Normal range of motion.  Skin:    General: Skin is warm and dry.  Neurological:     General: No focal deficit present.     Mental Status: He is alert. Mental status is at baseline.  Psychiatric:        Attention and Perception: Attention normal.        Mood and Affect: Mood is depressed.  Speech: Speech normal.        Behavior: Behavior normal.        Thought Content: Thought content normal.        Cognition and Memory: Cognition normal.        Judgment: Judgment normal.    Review of Systems  Constitutional: Negative.   HENT: Negative.    Eyes: Negative.   Respiratory: Negative.    Cardiovascular: Negative.   Gastrointestinal: Negative.   Musculoskeletal: Negative.   Skin: Negative.   Neurological: Negative.    Blood pressure 120/86, pulse 68, temperature 98.6 F (37 C), temperature source Oral, resp. rate 19, height 5\' 9"  (1.753 m), weight 67.6 kg, SpO2 100 %. Body mass index is 22 kg/m.   Treatment Plan Summary: Medication management and Plan encourage patient to consider getting up coming to groups often talking with staff working through his feelings.  Reviewed medicine.  Increase Lexapro to 15 mg.  Lexapro for anxiety and depression usually does better at a little bit higher dose.  Also increase Seroquel to 100 mg at night for mood sleep and anxiety.  Patient continues to state that he would like to go to some  kind of rehab and social work is aware of this.  Mordecai Rasmussen, MD 02/22/2023, 10:06 AM

## 2023-02-22 NOTE — Group Note (Signed)
Date:  02/22/2023 Time:  4:22 PM  Group Topic/Focus:  Goals Group:   The focus of this group is to help patients establish daily goals to achieve during treatment and discuss how the patient can incorporate goal setting into their daily lives to aide in recovery. Personal Choices and Values:   The focus of this group is to help patients assess and explore the importance of values in their lives, how their values affect their decisions, how they express their values and what opposes their expression. Self Care:   The focus of this group is to help patients understand the importance of self-care in order to improve or restore emotional, physical, spiritual, interpersonal, and financial health.    Participation Level:  Minimal  Participation Quality:  Appropriate  Affect:  Appropriate  Cognitive:  Alert and Appropriate  Insight: Appropriate  Engagement in Group:  Engaged and Lacking  Modes of Intervention:  Activity and Discussion  Additional Comments:    Doug Sou 02/22/2023, 4:22 PM

## 2023-02-22 NOTE — Plan of Care (Signed)

## 2023-02-22 NOTE — Group Note (Signed)
Date:  02/22/2023 Time:  10:07 PM  Group Topic/Focus:  Wrap-Up Group:   The focus of this group is to help patients review their daily goal of treatment and discuss progress on daily workbooks.    Participation Level:  Active  Participation Quality:  Appropriate and Attentive  Affect:  Appropriate  Cognitive:  Alert and Appropriate  Insight: Good  Engagement in Group:  Developing/Improving  Modes of Intervention:  Clarification and Discussion  Additional Comments:     Joshua Lewis 02/22/2023, 10:07 PM

## 2023-02-22 NOTE — [facility] Counselor (Signed)
CSW met with pt briefly to give application for [facility] program. Pt received this information and agreed to complete it. During this time CSW was updated that another peer was on the waiting list and pt was informed that this would like mean that he would be on the list as well. He voiced understanding. No other concerns expressed. Contact ended without incident.   CSW contacted [facility] intake coordinator, Gabriel Rung and was informed that there is no wait list. Gabriel Rung also shared that they will not be accepting application until 03/05/23. No other concerns expressed. Contact ended without incident.   Vilma Meckel. Algis Greenhouse, MSW, LCSW, LCAS 02/22/2023 4:32 PM

## 2023-02-22 NOTE — Progress Notes (Signed)
D- Patient alert and oriented. Patient initially presented in a sullen, but pleasant mood on assessment reporting that he slept "good" last night and had complaints of back pain. Patient rated his pain an "8/10", in which he did request PRN pain medication to help with relief. Patient endorsed hopelessness, depression, and anxiety stating "my family", is why he's feeling this way. Patient denied SI, HI, AVH at this time. Per his self-inventory, patient's stated goal for today is to "get well".  A- Scheduled medications administered to patient, per MD orders. Support and encouragement provided.  Routine safety checks conducted every 15 minutes. Patient informed to notify staff with problems or concerns.  R- No adverse drug reactions noted. Patient contracts for safety at this time. Patient compliant with medications and treatment plan. Patient receptive, calm, and cooperative. Patient interacts well with others on the unit. Patient remains safe at this time.   02/22/23 1045  Psych Admission Type (Psych Patients Only)  Admission Status Voluntary  Psychosocial Assessment  Patient Complaints Anxiety;Depression;Hopelessness  Eye Contact Fair  Facial Expression Sullen  Affect Sullen  Speech Logical/coherent  Interaction Assertive  Motor Activity Slow  Appearance/Hygiene Layered clothes;Unremarkable  Behavior Characteristics Cooperative;Appropriate to situation  Mood Sullen;Pleasant  Aggressive Behavior  Effect No apparent injury  Thought Process  Coherency Circumstantial  Content WDL  Delusions None reported or observed  Perception WDL  Hallucination None reported or observed  Judgment WDL  Confusion None  Danger to Self  Current suicidal ideation? Denies  Self-Injurious Behavior No self-injurious ideation or behavior indicators observed or expressed   Agreement Not to Harm Self Yes  Description of Agreement Verbal  Danger to Others  Danger to Others None reported or observed

## 2023-02-22 NOTE — [facility] Counselor (Signed)
CSW met briefly with pt regarding [facility] residential treatment request. Pt was given resource list for review. CSW encouraged pt to review list and let team know which programs were of interest to him. CSW also explained that these would be contacted to figure out next steps. Pt voiced agreement and said that he would review the list and get with CSW prior to COB. No other concerns expressed. Contact ended without incident.   Vilma Meckel. Algis Greenhouse, MSW, LCSW, LCAS 02/22/2023 10:50 AM

## 2023-02-22 NOTE — Progress Notes (Signed)
This writer went to go get patient for medication. Patient was found lying in bed, and stated that he was going to get up and come get his medicine. He has yet to do so.

## 2023-02-22 NOTE — Group Note (Signed)
[facility] LCSW Group Therapy Note   Group Date: 02/22/2023 Start Time: 1330 End Time: 1420   Type of Therapy/Topic:  Group Therapy:  Balance in Life  Participation Level:  Did Not Attend   Description of Group:    This group will address the concept of balance and how it feels and looks when one is unbalanced. Patients will be encouraged to process areas in their lives that are out of balance, and identify reasons for remaining unbalanced. Facilitators will guide patients utilizing problem- solving interventions to address and correct the stressor making their life unbalanced. Understanding and applying boundaries will be explored and addressed for obtaining  and maintaining a balanced life. Patients will be encouraged to explore ways to assertively make their unbalanced needs known to significant others in their lives, using other group members and facilitator for support and feedback.  Therapeutic Goals: Patient will identify two or more emotions or situations they have that consume much of in their lives. Patient will identify signs/triggers that life has become out of balance:  Patient will identify two ways to set boundaries in order to achieve balance in their lives:  Patient will demonstrate ability to communicate their needs through discussion and/or role plays  Summary of Patient Progress: X   Therapeutic Modalities:   Cognitive Behavioral Therapy Solution-Focused Therapy Assertiveness Training   Glenis Smoker, LCSW

## 2023-02-23 DIAGNOSIS — F332 Major depressive disorder, recurrent severe without psychotic features: Secondary | ICD-10-CM | POA: Diagnosis not present

## 2023-02-23 MED ORDER — NICOTINE POLACRILEX 2 MG MT GUM
2.0000 mg | CHEWING_GUM | OROMUCOSAL | Status: DC | PRN
  Administered 2023-03-01 – 2023-03-03 (×3): 2 mg via ORAL
  Filled 2023-02-23 (×4): qty 1

## 2023-02-23 NOTE — Group Note (Signed)
Date:  02/23/2023 Time:  11:23 AM  Group Topic/Focus:  Activity Group    Participation Level:  Active  Participation Quality:  Appropriate  Affect:  Appropriate  Cognitive:  Appropriate  Insight: Appropriate  Engagement in Group:  Engaged  Modes of Intervention:  Activity  Additional Comments:    Mary Sella Mellina Benison 02/23/2023, 11:23 AM

## 2023-02-23 NOTE — Progress Notes (Signed)
Patient calm and pleasant during assessment denying SI/HI/AVH, endorses depression and anxiety. Pt observed interacting appropriately with staff and peers on the unit. Pt compliant with medication administration per MD orders. Pt given education, support, and encouragement to be active in his treatment plan. Pt being monitored Q 15 minutes for safety per unit protocol, remains safe on the unit.  

## 2023-02-23 NOTE — Progress Notes (Signed)
Patient denies SI, HI & AVH.  He was cooperative with treatment, no new behavioral issues to report on shift.

## 2023-02-23 NOTE — [facility] Counselor (Signed)
CSW met with pt briefly to update regarding contact with [facility]. He was informed that they will not be accepting any applications until after 03/05/23. He voiced understanding. Pt was encouraged to review the resource list again and let CSW know if he was interested in any other programs. Pt endorsed wanting to have [facility] contact. CSW agreed to contact to figure out their process. He agreed. No other concerns expressed. Contact ended without incident.   Vilma Meckel. Algis Greenhouse, MSW, LCSW, LCAS 02/23/2023 4:28 PM

## 2023-02-23 NOTE — Progress Notes (Signed)
[facility] MD Progress Note  02/23/2023 4:09 PM Joshua Lewis  MRN:  [id number] Subjective: Follow-up 62 year old man with depression.  Patient continues to sit in his room wringing his hands a lot of the time but not all of the time.  Attends group as well.  Interacts with others.  Denies suicidal thoughts but still feels a little hopeless has not got a clear plan for discharge yet. Principal Problem: MDD (major depressive disorder) Diagnosis: Principal Problem:   MDD (major depressive disorder) Active Problems:   Cocaine abuse with cocaine-induced mood disorder (HCC)  Total Time spent with patient: 30 minutes  Past Psychiatric History: Past history of depression and substance abuse  Past Medical History:  Past Medical History:  Diagnosis Date   Chronic back pain greater than 3 months duration    Kidney carcinoma [facility])     Past Surgical History:  Procedure Laterality Date   ESOPHAGOGASTRODUODENOSCOPY (EGD) WITH PROPOFOL Left 08/09/2020   Procedure: ESOPHAGOGASTRODUODENOSCOPY (EGD) WITH PROPOFOL;  Surgeon: Vida Rigger, MD;  Location: [facility] ENDOSCOPY;  Service: Endoscopy;  Laterality: Left;   HAND SURGERY     HERNIA REPAIR     NEPHRECTOMY     right   Family History:  Family History  Problem Relation Age of Onset   Cancer Mother    Diabetes Mother    Hypertension Mother    Family Psychiatric  History: See previous Social History:  Social History   Substance and Sexual Activity  Alcohol Use Yes   Alcohol/week: 10.0 standard drinks of alcohol   Types: 10 Cans of beer per week     Social History   Substance and Sexual Activity  Drug Use Yes   Frequency: 1.0 times per week   Types: Cocaine    Social History   Socioeconomic History   Marital status: Married    Spouse name: Not on file   Number of children: 2   Years of education: Not on file   Highest education level: Not on file  Occupational History   Not on file  Tobacco Use   Smoking status: Some Days    Packs/day:  0.50    Years: 25.00    Additional pack years: 0.00    Total pack years: 12.50    Types: Cigarettes   Smokeless tobacco: Never  Vaping Use   Vaping Use: Never used  Substance and Sexual Activity   Alcohol use: Yes    Alcohol/week: 10.0 standard drinks of alcohol    Types: 10 Cans of beer per week   Drug use: Yes    Frequency: 1.0 times per week    Types: Cocaine   Sexual activity: Yes  Other Topics Concern   Not on file  Social History Narrative   Not on file   Social Determinants of Health   Financial Resource Strain: Not on file  Food Insecurity: Food Insecurity Present (02/18/2023)   Hunger Vital Sign    Worried About Running Out of Food in the Last Year: Sometimes true    Ran Out of Food in the Last Year: Sometimes true  Transportation Needs: Unmet Transportation Needs (02/18/2023)   PRAPARE - Administrator, Civil Service (Medical): Yes    Lack of Transportation (Non-Medical): Yes  Physical Activity: Not on file  Stress: Not on file  Social Connections: Not on file   Additional Social History:  Sleep: Fair  Appetite:  Fair  Current Medications: Current Facility-Administered Medications  Medication Dose Route Frequency Provider Last Rate Last Admin   acetaminophen (TYLENOL) tablet 650 mg  650 mg Oral Q4H PRN Eligha Bridegroom, NP   650 mg at 02/22/23 1044   alum & mag hydroxide-simeth (MAALOX/MYLANTA) 200-200-20 MG/5ML suspension 30 mL  30 mL Oral Q6H PRN Eligha Bridegroom, NP       diphenhydrAMINE (BENADRYL) capsule 50 mg  50 mg Oral TID PRN Eligha Bridegroom, NP       Or   diphenhydrAMINE (BENADRYL) injection 50 mg  50 mg Intramuscular TID PRN Eligha Bridegroom, NP       escitalopram (LEXAPRO) tablet 15 mg  15 mg Oral Daily Tayler Heiden, Jackquline Denmark, MD   15 mg at 02/23/23 1610   haloperidol (HALDOL) tablet 5 mg  5 mg Oral TID PRN Eligha Bridegroom, NP       Or   haloperidol lactate (HALDOL) injection 5 mg  5 mg Intramuscular TID  PRN Eligha Bridegroom, NP       hydrOXYzine (ATARAX) tablet 25 mg  25 mg Oral TID PRN Eligha Bridegroom, NP   25 mg at 02/21/23 1820   ibuprofen (ADVIL) tablet 600 mg  600 mg Oral Q6H PRN Oceanna Arruda, Jackquline Denmark, MD       LORazepam (ATIVAN) tablet 2 mg  2 mg Oral TID PRN Eligha Bridegroom, NP       Or   LORazepam (ATIVAN) injection 2 mg  2 mg Intramuscular TID PRN Eligha Bridegroom, NP       magnesium hydroxide (MILK OF MAGNESIA) suspension 30 mL  30 mL Oral Daily PRN Eligha Bridegroom, NP       nicotine (NICODERM CQ - dosed in mg/24 hours) patch 14 mg  14 mg Transdermal Daily Chucky Homes, Jackquline Denmark, MD   14 mg at 02/23/23 9604   nicotine polacrilex (NICORETTE) gum 2 mg  2 mg Oral PRN Tasmine Hipwell, Jackquline Denmark, MD       QUEtiapine (SEROQUEL XR) 24 hr tablet 100 mg  100 mg Oral QHS Billee Balcerzak, Jackquline Denmark, MD   100 mg at 02/22/23 2114    Lab Results: No results found for this or any previous visit (from the past 48 hour(s)).  Blood Alcohol level:  Lab Results  Component Value Date   ETH <10 02/16/2023   ETH <10 08/22/2021    Metabolic Disorder Labs: Lab Results  Component Value Date   HGBA1C 5.6 02/21/2023   MPG 114.02 02/21/2023   No results found for: "PROLACTIN" Lab Results  Component Value Date   CHOL 171 02/21/2023   TRIG 54 02/21/2023   HDL 53 02/21/2023   CHOLHDL 3.2 02/21/2023   VLDL 11 02/21/2023   LDLCALC 107 (H) 02/21/2023   LDLCALC 154 (H) 01/28/2020    Physical Findings: AIMS:  , ,  ,  ,    CIWA:    COWS:     Musculoskeletal: Strength & Muscle Tone: within normal limits Gait & Station: normal Patient leans: N/A  Psychiatric Specialty Exam:  Presentation  General Appearance:  Appropriate for Environment; Casual; Neat  Eye Contact: Poor  Speech: Normal Rate  Speech Volume: Decreased  Handedness: Right   Mood and Affect  Mood: Anxious; Depressed  Affect: Flat   Thought Process  Thought Processes: Coherent  Descriptions of Associations:Intact  Orientation:Full  (Time, Place and Person)  Thought Content:Logical  History of Schizophrenia/Schizoaffective disorder:No data recorded Duration of Psychotic Symptoms:No data recorded Hallucinations:No data recorded Ideas  of Reference:None  Suicidal Thoughts:No data recorded Homicidal Thoughts:No data recorded  Sensorium  Memory: Immediate Fair; Remote Good  Judgment: Fair  Insight: Fair   Chartered certified accountant: Fair  Attention Span: Fair  Recall: Fair  Fund of Knowledge: Fair  Language: Fair   Psychomotor Activity  Psychomotor Activity:No data recorded  Assets  Assets: Communication Skills; Desire for Improvement; Financial Resources/Insurance; Social Support   Sleep  Sleep:No data recorded   Physical Exam: Physical Exam Vitals reviewed.  Constitutional:      Appearance: Normal appearance.  HENT:     Head: Normocephalic and atraumatic.     Mouth/Throat:     Pharynx: Oropharynx is clear.  Eyes:     Pupils: Pupils are equal, round, and reactive to light.  Cardiovascular:     Rate and Rhythm: Normal rate and regular rhythm.  Pulmonary:     Effort: Pulmonary effort is normal.     Breath sounds: Normal breath sounds.  Abdominal:     General: Abdomen is flat.     Palpations: Abdomen is soft.  Musculoskeletal:        General: Normal range of motion.  Skin:    General: Skin is warm and dry.  Neurological:     General: No focal deficit present.     Mental Status: He is alert. Mental status is at baseline.  Psychiatric:        Attention and Perception: Attention normal.        Mood and Affect: Mood is anxious. Affect is blunt.        Speech: Speech normal.        Behavior: Behavior is withdrawn.        Thought Content: Thought content normal.    Review of Systems  Constitutional: Negative.   HENT: Negative.    Eyes: Negative.   Respiratory: Negative.    Cardiovascular: Negative.   Gastrointestinal: Negative.   Musculoskeletal: Negative.    Skin: Negative.   Neurological: Negative.   Psychiatric/Behavioral:  The patient is nervous/anxious.    Blood pressure (!) 144/77, pulse 90, temperature 98.6 F (37 C), temperature source Oral, resp. rate 20, height 5\' 9"  (1.753 m), weight 67.6 kg, SpO2 100 %. Body mass index is 22 kg/m.   Treatment Plan Summary: Plan no change to treatment plan.  I increased his Lexapro yesterday.  We will leave it where it is for now.  Blood pressure looking a little bit better.  Encourage group attendance.  Still working with social work on investigating possible places to go for rehab  Mordecai Rasmussen, MD 02/23/2023, 4:09 PM

## 2023-02-23 NOTE — Plan of Care (Signed)
D- Patient alert and oriented. Patient presented in a pleasant mood on assessment reporting that he slept "good" last night and had no complaints to voice to this Clinical research associate. Patient continues to endorse depression, hopelessness, and anxiety, rating them all "7/10", stating the same reasons from yesterday. Patient stated "my family", is why he's feeling this way. Patient denied SI, HI, AVH, and pain at this time. Patient had no stated goals for today.  A- Scheduled medications administered to patient, per MD orders. Support and encouragement provided.  Routine safety checks conducted every 15 minutes.  Patient informed to notify staff with problems or concerns.  R- No adverse drug reactions noted. Patient contracts for safety at this time. Patient compliant with medications and treatment plan. Patient receptive, calm, and cooperative. Patient interacts well with others on the unit. Patient remains safe at this time.  Problem: Education: Goal: Knowledge of General Education information will improve Description: Including pain rating scale, medication(s)/side effects and non-pharmacologic comfort measures Outcome: Progressing   Problem: Health Behavior/Discharge Planning: Goal: Ability to manage health-related needs will improve Outcome: Progressing   Problem: Clinical Measurements: Goal: Ability to maintain clinical measurements within normal limits will improve Outcome: Progressing Goal: Will remain free from infection Outcome: Progressing Goal: Diagnostic test results will improve Outcome: Progressing Goal: Respiratory complications will improve Outcome: Progressing Goal: Cardiovascular complication will be avoided Outcome: Progressing   Problem: Activity: Goal: Risk for activity intolerance will decrease Outcome: Progressing   Problem: Nutrition: Goal: Adequate nutrition will be maintained Outcome: Progressing   Problem: Coping: Goal: Level of anxiety will decrease Outcome:  Progressing   Problem: Elimination: Goal: Will not experience complications related to bowel motility Outcome: Progressing Goal: Will not experience complications related to urinary retention Outcome: Progressing   Problem: Pain Managment: Goal: General experience of comfort will improve Outcome: Progressing   Problem: Safety: Goal: Ability to remain free from injury will improve Outcome: Progressing   Problem: Skin Integrity: Goal: Risk for impaired skin integrity will decrease Outcome: Progressing

## 2023-02-24 DIAGNOSIS — F332 Major depressive disorder, recurrent severe without psychotic features: Secondary | ICD-10-CM | POA: Diagnosis not present

## 2023-02-24 NOTE — BH IP Treatment Plan (Signed)
Interdisciplinary Treatment and Diagnostic Plan Update  02/24/2023 Time of Session: 11:28 am Joshua Lewis MRN: [id number]  Principal Diagnosis: MDD (major depressive disorder)  Secondary Diagnoses: Principal Problem:   MDD (major depressive disorder) Active Problems:   Cocaine abuse with cocaine-induced mood disorder (HCC)   Current Medications:  Current Facility-Administered Medications  Medication Dose Route Frequency Provider Last Rate Last Admin   acetaminophen (TYLENOL) tablet 650 mg  650 mg Oral Q4H PRN Eligha Bridegroom, NP   650 mg at 02/22/23 1044   alum & mag hydroxide-simeth (MAALOX/MYLANTA) 200-200-20 MG/5ML suspension 30 mL  30 mL Oral Q6H PRN Eligha Bridegroom, NP       diphenhydrAMINE (BENADRYL) capsule 50 mg  50 mg Oral TID PRN Eligha Bridegroom, NP       Or   diphenhydrAMINE (BENADRYL) injection 50 mg  50 mg Intramuscular TID PRN Eligha Bridegroom, NP       escitalopram (LEXAPRO) tablet 15 mg  15 mg Oral Daily Clapacs, Jackquline Denmark, MD   15 mg at 02/24/23 8119   haloperidol (HALDOL) tablet 5 mg  5 mg Oral TID PRN Eligha Bridegroom, NP       Or   haloperidol lactate (HALDOL) injection 5 mg  5 mg Intramuscular TID PRN Eligha Bridegroom, NP       hydrOXYzine (ATARAX) tablet 25 mg  25 mg Oral TID PRN Eligha Bridegroom, NP   25 mg at 02/21/23 1820   ibuprofen (ADVIL) tablet 600 mg  600 mg Oral Q6H PRN Clapacs, Jackquline Denmark, MD       LORazepam (ATIVAN) tablet 2 mg  2 mg Oral TID PRN Eligha Bridegroom, NP       Or   LORazepam (ATIVAN) injection 2 mg  2 mg Intramuscular TID PRN Eligha Bridegroom, NP       magnesium hydroxide (MILK OF MAGNESIA) suspension 30 mL  30 mL Oral Daily PRN Eligha Bridegroom, NP       nicotine (NICODERM CQ - dosed in mg/24 hours) patch 14 mg  14 mg Transdermal Daily Clapacs, Jackquline Denmark, MD   14 mg at 02/24/23 1478   nicotine polacrilex (NICORETTE) gum 2 mg  2 mg Oral PRN Clapacs, Jackquline Denmark, MD       QUEtiapine (SEROQUEL XR) 24 hr tablet 100 mg  100 mg Oral QHS Clapacs, Jackquline Denmark, MD    100 mg at 02/23/23 2114   PTA Medications: No medications prior to admission.    Patient Stressors: Financial difficulties   Marital or family conflict   Substance abuse    Patient Strengths: Automotive engineer for treatment/growth   Treatment Modalities: Medication Management, Group therapy, Case management,  1 to 1 session with clinician, Psychoeducation, Recreational therapy.   Physician Treatment Plan for Primary Diagnosis: MDD (major depressive disorder) Long Term Goal(s): Improvement in symptoms so as ready for discharge   Short Term Goals: Ability to demonstrate self-control will improve Ability to identify triggers associated with substance abuse/mental health issues will improve Ability to identify changes in lifestyle to reduce recurrence of condition will improve Compliance with prescribed medications will improve  Medication Management: Evaluate patient's response, side effects, and tolerance of medication regimen.  Therapeutic Interventions: 1 to 1 sessions, Unit Group sessions and Medication administration.  Evaluation of Outcomes: Progressing  Physician Treatment Plan for Secondary Diagnosis: Principal Problem:   MDD (major depressive disorder) Active Problems:   Cocaine abuse with cocaine-induced mood disorder (HCC)  Long Term Goal(s): Improvement in symptoms so as ready  for discharge   Short Term Goals: Ability to demonstrate self-control will improve Ability to identify triggers associated with substance abuse/mental health issues will improve Ability to identify changes in lifestyle to reduce recurrence of condition will improve Compliance with prescribed medications will improve     Medication Management: Evaluate patient's response, side effects, and tolerance of medication regimen.  Therapeutic Interventions: 1 to 1 sessions, Unit Group sessions and Medication administration.  Evaluation of Outcomes: Progressing   RN  Treatment Plan for Primary Diagnosis: MDD (major depressive disorder) Long Term Goal(s): Knowledge of disease and therapeutic regimen to maintain health will improve  Short Term Goals: Ability to remain free from injury will improve, Ability to verbalize frustration and anger appropriately will improve, Ability to demonstrate self-control, Ability to participate in decision making will improve, Ability to verbalize feelings will improve, Ability to disclose and discuss suicidal ideas, Ability to identify and develop effective coping behaviors will improve, and Compliance with prescribed medications will improve  Medication Management: RN will administer medications as ordered by provider, will assess and evaluate patient's response and provide education to patient for prescribed medication. RN will report any adverse and/or side effects to prescribing provider.  Therapeutic Interventions: 1 on 1 counseling sessions, Psychoeducation, Medication administration, Evaluate responses to treatment, Monitor vital signs and CBGs as ordered, Perform/monitor CIWA, COWS, AIMS and Fall Risk screenings as ordered, Perform wound care treatments as ordered.  Evaluation of Outcomes: Progressing   LCSW Treatment Plan for Primary Diagnosis: MDD (major depressive disorder) Long Term Goal(s): Safe transition to appropriate next level of care at discharge, Engage patient in therapeutic group addressing interpersonal concerns.  Short Term Goals: Engage patient in aftercare planning with referrals and resources, Increase social support, Increase ability to appropriately verbalize feelings, Increase emotional regulation, Facilitate acceptance of mental health diagnosis and concerns, Facilitate patient progression through stages of change regarding substance use diagnoses and concerns, Identify triggers associated with mental health/substance abuse issues, and Increase skills for wellness and recovery  Therapeutic  Interventions: Assess for all discharge needs, 1 to 1 time with Social worker, Explore available resources and support systems, Assess for adequacy in community support network, Educate family and significant other(s) on suicide prevention, Complete Psychosocial Assessment, Interpersonal group therapy.  Evaluation of Outcomes: Progressing   Progress in Treatment: Attending groups: Yes. Participating in groups: No. Taking medication as prescribed: Yes. Toleration medication: Yes. Family/Significant other contact made: No, will contact:  1st attempt was made 6/9 Patient understands diagnosis: Yes. Discussing patient identified problems/goals with staff: Yes. Medical problems stabilized or resolved: Yes. Denies suicidal/homicidal ideation: Yes. Issues/concerns per patient self-inventory: No. Other: none  New problem(s) identified: No, Describe:  none  New Short Term/Long Term Goal(s): detox, elimination of symptoms of psychosis, medication management for mood stabilization; elimination of SI thoughts; development of comprehensive mental wellness/sobriety plan. Update 02/24/23: no changes at this time.  Patient Goals:  "Getting my mind back the way it used to be. I've fell into a full blown depression stage." Update 02/24/23: no changes at this time.  Discharge Plan or Barriers: CSW will assist pt with development of an appropriate aftercare/discharge plan. Update 02/24/23: no changes at this time.    Reason for Continuation of Hospitalization: Depression Medication stabilization Suicidal ideation  Estimated Length of Stay: 1-7 days Update 02/24/23: TBD  Last 3 [place] Suicide Severity Risk Score: Flowsheet Row Admission (Current) from 02/17/2023 in [facility] ED from 02/16/2023 [facility] Department [facility] (Dis[facility]  [facility]  C-SSRS RISK CATEGORY High Risk High Risk High  Risk       Last PHQ 2/9 Scores:    02/17/2020   11:16 AM  Depression screen PHQ 2/9  Decreased Interest 0  Down, Depressed, Hopeless 0  PHQ - 2 Score 0    Scribe for Treatment Team: [name], LCSW 02/24/2023 11:28 AM

## 2023-02-24 NOTE — [facility] Group Notes (Signed)
[facility] Group Notes:  (Nursing/MHT/Case Management/Adjunct)  Date:  02/24/2023  Time:  2:35 PM  Type of Therapy:  activity  Participation Level:  Did Not Attend  Participation Quality:  na  Affect:  na  Cognitive:  na  Insight:  None  Engagement in Group:  na  Modes of Intervention:  na  Summary of Progress/Problems:  Courtyard Recreation Activity. Pt did not attend.   Brandyn Lowrey 02/24/2023, 2:35 PM 

## 2023-02-24 NOTE — Progress Notes (Signed)
Patient ID: Joshua Lewis, male   DOB: 19-Jul-1961, 62 y.o.   MRN: [id number] Patient presents with depressed mood, affect congruent. Damiano reports he is '' doing better'' and feels his mood is improving but reports continued passive suicidal ideation. He reports good sleep. He states ' I need to work on getting into rehab. She gave me a list to look at so I really don't have one in specific. '' Pt denies any AH or VH . Pt is eating and drinking well and able to make his needs known. Has been isolative to room this am but voices no acute concerns.

## 2023-02-24 NOTE — Progress Notes (Signed)
[facility] MD Progress Note  02/24/2023 10:31 AM Joshua Lewis  MRN:  [id number] Subjective: Follow-up patient with depression.  Patient seen and chart reviewed.  Patient has no specific complaints.  Mood is somewhat improved.  No suicidal ideation.  No physical complaints.  Tolerating medicine well.  Sleeping well.  Blood pressure stable. Principal Problem: MDD (major depressive disorder) Diagnosis: Principal Problem:   MDD (major depressive disorder) Active Problems:   Cocaine abuse with cocaine-induced mood disorder (HCC)  Total Time spent with patient: 30 minutes  Past Psychiatric History: Past history of depression and cocaine use  Past Medical History:  Past Medical History:  Diagnosis Date   Chronic back pain greater than 3 months duration    Kidney carcinoma [facility])     Past Surgical History:  Procedure Laterality Date   ESOPHAGOGASTRODUODENOSCOPY (EGD) WITH PROPOFOL Left 08/09/2020   Procedure: ESOPHAGOGASTRODUODENOSCOPY (EGD) WITH PROPOFOL;  Surgeon: Vida Rigger, MD;  Location: [facility] ENDOSCOPY;  Service: Endoscopy;  Laterality: Left;   HAND SURGERY     HERNIA REPAIR     NEPHRECTOMY     right   Family History:  Family History  Problem Relation Age of Onset   Cancer Mother    Diabetes Mother    Hypertension Mother    Family Psychiatric  History: See previous Social History:  Social History   Substance and Sexual Activity  Alcohol Use Yes   Alcohol/week: 10.0 standard drinks of alcohol   Types: 10 Cans of beer per week     Social History   Substance and Sexual Activity  Drug Use Yes   Frequency: 1.0 times per week   Types: Cocaine    Social History   Socioeconomic History   Marital status: Married    Spouse name: Not on file   Number of children: 2   Years of education: Not on file   Highest education level: Not on file  Occupational History   Not on file  Tobacco Use   Smoking status: Some Days    Packs/day: 0.50    Years: 25.00    Additional pack years:  0.00    Total pack years: 12.50    Types: Cigarettes   Smokeless tobacco: Never  Vaping Use   Vaping Use: Never used  Substance and Sexual Activity   Alcohol use: Yes    Alcohol/week: 10.0 standard drinks of alcohol    Types: 10 Cans of beer per week   Drug use: Yes    Frequency: 1.0 times per week    Types: Cocaine   Sexual activity: Yes  Other Topics Concern   Not on file  Social History Narrative   Not on file   Social Determinants of Health   Financial Resource Strain: Not on file  Food Insecurity: Food Insecurity Present (02/18/2023)   Hunger Vital Sign    Worried About Running Out of Food in the Last Year: Sometimes true    Ran Out of Food in the Last Year: Sometimes true  Transportation Needs: Unmet Transportation Needs (02/18/2023)   PRAPARE - Administrator, Civil Service (Medical): Yes    Lack of Transportation (Non-Medical): Yes  Physical Activity: Not on file  Stress: Not on file  Social Connections: Not on file   Additional Social History:                         Sleep: Fair  Appetite:  Fair  Current Medications: Current Facility-Administered Medications  Medication  Dose Route Frequency Provider Last Rate Last Admin   acetaminophen (TYLENOL) tablet 650 mg  650 mg Oral Q4H PRN Eligha Bridegroom, NP   650 mg at 02/22/23 1044   alum & mag hydroxide-simeth (MAALOX/MYLANTA) 200-200-20 MG/5ML suspension 30 mL  30 mL Oral Q6H PRN Eligha Bridegroom, NP       diphenhydrAMINE (BENADRYL) capsule 50 mg  50 mg Oral TID PRN Eligha Bridegroom, NP       Or   diphenhydrAMINE (BENADRYL) injection 50 mg  50 mg Intramuscular TID PRN Eligha Bridegroom, NP       escitalopram (LEXAPRO) tablet 15 mg  15 mg Oral Daily Melody Cirrincione, Jackquline Denmark, MD   15 mg at 02/24/23 7829   haloperidol (HALDOL) tablet 5 mg  5 mg Oral TID PRN Eligha Bridegroom, NP       Or   haloperidol lactate (HALDOL) injection 5 mg  5 mg Intramuscular TID PRN Eligha Bridegroom, NP       hydrOXYzine  (ATARAX) tablet 25 mg  25 mg Oral TID PRN Eligha Bridegroom, NP   25 mg at 02/21/23 1820   ibuprofen (ADVIL) tablet 600 mg  600 mg Oral Q6H PRN Quiara Killian, Jackquline Denmark, MD       LORazepam (ATIVAN) tablet 2 mg  2 mg Oral TID PRN Eligha Bridegroom, NP       Or   LORazepam (ATIVAN) injection 2 mg  2 mg Intramuscular TID PRN Eligha Bridegroom, NP       magnesium hydroxide (MILK OF MAGNESIA) suspension 30 mL  30 mL Oral Daily PRN Eligha Bridegroom, NP       nicotine (NICODERM CQ - dosed in mg/24 hours) patch 14 mg  14 mg Transdermal Daily Hermine Feria, Jackquline Denmark, MD   14 mg at 02/24/23 5621   nicotine polacrilex (NICORETTE) gum 2 mg  2 mg Oral PRN Hall Birchard, Jackquline Denmark, MD       QUEtiapine (SEROQUEL XR) 24 hr tablet 100 mg  100 mg Oral QHS Lyndsey Demos, Jackquline Denmark, MD   100 mg at 02/23/23 2114    Lab Results: No results found for this or any previous visit (from the past 48 hour(s)).  Blood Alcohol level:  Lab Results  Component Value Date   ETH <10 02/16/2023   ETH <10 08/22/2021    Metabolic Disorder Labs: Lab Results  Component Value Date   HGBA1C 5.6 02/21/2023   MPG 114.02 02/21/2023   No results found for: "PROLACTIN" Lab Results  Component Value Date   CHOL 171 02/21/2023   TRIG 54 02/21/2023   HDL 53 02/21/2023   CHOLHDL 3.2 02/21/2023   VLDL 11 02/21/2023   LDLCALC 107 (H) 02/21/2023   LDLCALC 154 (H) 01/28/2020    Physical Findings: AIMS:  , ,  ,  ,    CIWA:    COWS:     Musculoskeletal: Strength & Muscle Tone: within normal limits Gait & Station: normal Patient leans: N/A  Psychiatric Specialty Exam:  Presentation  General Appearance:  Appropriate for Environment; Casual; Neat  Eye Contact: Poor  Speech: Normal Rate  Speech Volume: Decreased  Handedness: Right   Mood and Affect  Mood: Anxious; Depressed  Affect: Flat   Thought Process  Thought Processes: Coherent  Descriptions of Associations:Intact  Orientation:Full (Time, Place and Person)  Thought  Content:Logical  History of Schizophrenia/Schizoaffective disorder:No data recorded Duration of Psychotic Symptoms:No data recorded Hallucinations:No data recorded Ideas of Reference:None  Suicidal Thoughts:No data recorded Homicidal Thoughts:No data recorded  Sensorium  Memory: Immediate Fair; Remote Good  Judgment: Fair  Insight: Fair   Chartered certified accountant: Fair  Attention Span: Fair  Recall: Jennelle Human of Knowledge: Fair  Language: Fair   Psychomotor Activity  Psychomotor Activity:No data recorded  Assets  Assets: Communication Skills; Desire for Improvement; Financial Resources/Insurance; Social Support   Sleep  Sleep:No data recorded   Physical Exam: Physical Exam Vitals and nursing note reviewed.  Constitutional:      Appearance: Normal appearance.  HENT:     Head: Normocephalic and atraumatic.     Mouth/Throat:     Pharynx: Oropharynx is clear.  Eyes:     Pupils: Pupils are equal, round, and reactive to light.  Cardiovascular:     Rate and Rhythm: Normal rate and regular rhythm.  Pulmonary:     Effort: Pulmonary effort is normal.     Breath sounds: Normal breath sounds.  Abdominal:     General: Abdomen is flat.     Palpations: Abdomen is soft.  Musculoskeletal:        General: Normal range of motion.  Skin:    General: Skin is warm and dry.  Neurological:     General: No focal deficit present.     Mental Status: He is alert. Mental status is at baseline.  Psychiatric:        Attention and Perception: Attention normal.        Mood and Affect: Mood is depressed.        Speech: Speech normal.        Behavior: Behavior normal.        Thought Content: Thought content normal.        Cognition and Memory: Cognition normal.    Review of Systems  Constitutional: Negative.   HENT: Negative.    Eyes: Negative.   Respiratory: Negative.    Cardiovascular: Negative.   Gastrointestinal: Negative.   Musculoskeletal:  Negative.   Skin: Negative.   Neurological: Negative.   Psychiatric/Behavioral:  Positive for depression. Negative for hallucinations, substance abuse and suicidal ideas. The patient is not nervous/anxious.    Blood pressure 130/77, pulse 97, temperature 98.4 F (36.9 C), temperature source Oral, resp. rate 20, height 5\' 9"  (1.753 m), weight 67.6 kg, SpO2 100 %. Body mass index is 22 kg/m.   Treatment Plan Summary: Medication management and Plan feeling better.  Psychiatrically much improved.  Remains homeless which is a major impairment to discharge planning.  Will continue to work on possible options hoping for discharge this week.  Mordecai Rasmussen, MD 02/24/2023, 10:31 AM

## 2023-02-24 NOTE — [facility] Group Notes (Signed)
[facility] Group Notes:  (Nursing/MHT/Case Management/Adjunct)  Date:  02/24/2023  Time:  10:28 AM  Type of Therapy:  Psychoeducational Skills  Participation Level:  Did Not Attend  Participation Quality:   na  Affect:   na  Cognitive:   na  Insight:  None  Engagement in Group:   na  Modes of Intervention:   na  Summary of Progress/Problems:  Joshua Lewis 02/24/2023, 10:28 AM

## 2023-02-24 NOTE — BH IP Treatment Plan (Signed)
Interdisciplinary Treatment and Diagnostic Plan Update  02/24/2023 Time of Session: 11:35 am Joshua Lewis MRN: [id number]  Principal Diagnosis: MDD (major depressive disorder)  Secondary Diagnoses: Principal Problem:   MDD (major depressive disorder) Active Problems:   Cocaine abuse with cocaine-induced mood disorder (HCC)   Current Medications:  Current Facility-Administered Medications  Medication Dose Route Frequency Provider Last Rate Last Admin   acetaminophen (TYLENOL) tablet 650 mg  650 mg Oral Q4H PRN Eligha Bridegroom, NP   650 mg at 02/22/23 1044   alum & mag hydroxide-simeth (MAALOX/MYLANTA) 200-200-20 MG/5ML suspension 30 mL  30 mL Oral Q6H PRN Eligha Bridegroom, NP       diphenhydrAMINE (BENADRYL) capsule 50 mg  50 mg Oral TID PRN Eligha Bridegroom, NP       Or   diphenhydrAMINE (BENADRYL) injection 50 mg  50 mg Intramuscular TID PRN Eligha Bridegroom, NP       escitalopram (LEXAPRO) tablet 15 mg  15 mg Oral Daily Clapacs, Jackquline Denmark, MD   15 mg at 02/24/23 7829   haloperidol (HALDOL) tablet 5 mg  5 mg Oral TID PRN Eligha Bridegroom, NP       Or   haloperidol lactate (HALDOL) injection 5 mg  5 mg Intramuscular TID PRN Eligha Bridegroom, NP       hydrOXYzine (ATARAX) tablet 25 mg  25 mg Oral TID PRN Eligha Bridegroom, NP   25 mg at 02/21/23 1820   ibuprofen (ADVIL) tablet 600 mg  600 mg Oral Q6H PRN Clapacs, Jackquline Denmark, MD       LORazepam (ATIVAN) tablet 2 mg  2 mg Oral TID PRN Eligha Bridegroom, NP       Or   LORazepam (ATIVAN) injection 2 mg  2 mg Intramuscular TID PRN Eligha Bridegroom, NP       magnesium hydroxide (MILK OF MAGNESIA) suspension 30 mL  30 mL Oral Daily PRN Eligha Bridegroom, NP       nicotine (NICODERM CQ - dosed in mg/24 hours) patch 14 mg  14 mg Transdermal Daily Clapacs, Jackquline Denmark, MD   14 mg at 02/24/23 5621   nicotine polacrilex (NICORETTE) gum 2 mg  2 mg Oral PRN Clapacs, Jackquline Denmark, MD       QUEtiapine (SEROQUEL XR) 24 hr tablet 100 mg  100 mg Oral QHS Clapacs, Jackquline Denmark, MD    100 mg at 02/23/23 2114   PTA Medications: No medications prior to admission.    Patient Stressors: Financial difficulties   Marital or family conflict   Substance abuse    Patient Strengths: Automotive engineer for treatment/growth   Treatment Modalities: Medication Management, Group therapy, Case management,  1 to 1 session with clinician, Psychoeducation, Recreational therapy.   Physician Treatment Plan for Primary Diagnosis: MDD (major depressive disorder) Long Term Goal(s): Improvement in symptoms so as ready for discharge   Short Term Goals: Ability to demonstrate self-control will improve Ability to identify triggers associated with substance abuse/mental health issues will improve Ability to identify changes in lifestyle to reduce recurrence of condition will improve Compliance with prescribed medications will improve  Medication Management: Evaluate patient's response, side effects, and tolerance of medication regimen.  Therapeutic Interventions: 1 to 1 sessions, Unit Group sessions and Medication administration.  Evaluation of Outcomes: Progressing  Physician Treatment Plan for Secondary Diagnosis: Principal Problem:   MDD (major depressive disorder) Active Problems:   Cocaine abuse with cocaine-induced mood disorder (HCC)  Long Term Goal(s): Improvement in symptoms so as ready  for discharge   Short Term Goals: Ability to demonstrate self-control will improve Ability to identify triggers associated with substance abuse/mental health issues will improve Ability to identify changes in lifestyle to reduce recurrence of condition will improve Compliance with prescribed medications will improve     Medication Management: Evaluate patient's response, side effects, and tolerance of medication regimen.  Therapeutic Interventions: 1 to 1 sessions, Unit Group sessions and Medication administration.  Evaluation of Outcomes: Progressing   RN  Treatment Plan for Primary Diagnosis: MDD (major depressive disorder) Long Term Goal(s): Knowledge of disease and therapeutic regimen to maintain health will improve  Short Term Goals: Ability to remain free from injury will improve, Ability to verbalize frustration and anger appropriately will improve, Ability to demonstrate self-control, Ability to participate in decision making will improve, Ability to verbalize feelings will improve, Ability to disclose and discuss suicidal ideas, Ability to identify and develop effective coping behaviors will improve, and Compliance with prescribed medications will improve  Medication Management: RN will administer medications as ordered by provider, will assess and evaluate patient's response and provide education to patient for prescribed medication. RN will report any adverse and/or side effects to prescribing provider.  Therapeutic Interventions: 1 on 1 counseling sessions, Psychoeducation, Medication administration, Evaluate responses to treatment, Monitor vital signs and CBGs as ordered, Perform/monitor CIWA, COWS, AIMS and Fall Risk screenings as ordered, Perform wound care treatments as ordered.  Evaluation of Outcomes: Progressing   LCSW Treatment Plan for Primary Diagnosis: MDD (major depressive disorder) Long Term Goal(s): Safe transition to appropriate next level of care at discharge, Engage patient in therapeutic group addressing interpersonal concerns.  Short Term Goals: Engage patient in aftercare planning with referrals and resources, Increase social support, Increase ability to appropriately verbalize feelings, Increase emotional regulation, Facilitate acceptance of mental health diagnosis and concerns, Facilitate patient progression through stages of change regarding substance use diagnoses and concerns, Identify triggers associated with mental health/substance abuse issues, and Increase skills for wellness and recovery  Therapeutic  Interventions: Assess for all discharge needs, 1 to 1 time with Social worker, Explore available resources and support systems, Assess for adequacy in community support network, Educate family and significant other(s) on suicide prevention, Complete Psychosocial Assessment, Interpersonal group therapy.  Evaluation of Outcomes: Progressing   Progress in Treatment: Attending groups: Yes. Participating in groups: No. Taking medication as prescribed: Yes. Toleration medication: Yes. Family/Significant other contact made: No, will contact:  1st attempt made  Patient understands diagnosis: Yes. Discussing patient identified problems/goals with staff: Yes. Medical problems stabilized or resolved: Yes. Denies suicidal/homicidal ideation: Yes. Issues/concerns per patient self-inventory: No. Other: none  New problem(s) identified: No, Describe:  none  New Short Term/Long Term Goal(s):  detox, elimination of symptoms of psychosis, medication management for mood stabilization; elimination of SI thoughts; development of comprehensive mental wellness/sobriety plan. Update 02/24/23: No changes at this time.  Patient Goals:  "Getting my mind back the way it used to be. I've fell into a full blown depression stage." Update 02/24/23: No changes at this time.  Discharge Plan or Barriers: CSW will assist pt with development of an appropriate aftercare/discharge plan. Update 02/24/23: No changes at this time.    Reason for Continuation of Hospitalization: Depression Medication stabilization Suicidal ideation  Estimated Length of Stay: 1-7 days Update 02/24/23: TBD  Last 3 [place] Suicide Severity Risk Score: Flowsheet Row Admission (Current) from 02/17/2023 in [facility] ED from 02/16/2023 in [facility] Department [facility] (Discharged[facility]  [facility]  C-SSRS RISK CATEGORY High Risk High Risk High Risk        Last PHQ 2/9 Scores:    02/17/2020   11:16 AM  Depression screen PHQ 2/9  Decreased Interest 0  Down, Depressed, Hopeless 0  PHQ - 2 Score 0    Scribe for Treatment Team: [name], LCSW 02/24/2023 9:07 AM

## 2023-02-24 NOTE — Group Note (Signed)
LCSW Group Therapy Note  Group Date: 02/24/2023 Start Time: 1307 End Time: 1412   Type of Therapy and Topic:  Group Therapy - Self-Care VS. Coping Skills Participation Level:  Active   Description of Group The focus of this group was to determine what unhealthy coping techniques typically are used by group members and what healthy coping techniques would be helpful in coping with various problems. Patients were guided in becoming aware of the differences between healthy and unhealthy coping techniques. Patients were asked to identify 2-3 healthy coping skills they would like to learn to use more effectively.  Therapeutic Goals Patients learned that coping is what human beings do all day long to deal with various situations in their lives Patients defined and discussed healthy vs unhealthy coping techniques Patients identified their preferred coping techniques and identified whether these were healthy or unhealthy Patients determined 2-3 healthy coping skills they would like to become more familiar with and use more often. Patients provided support and ideas to each other   Summary of Patient Progress:  Pt. Attended group. Patient participated when called upon, he stated that he leans on his faith to help cope and that he doesn't really have anyone in his life he could talk to.  Therapeutic Modalities Cognitive Behavioral Therapy Motivational Interviewing  Tama Headings 02/24/2023  4:44 PM

## 2023-02-24 NOTE — [facility] Group Notes (Signed)
[facility] Group Notes:  (Nursing/MHT/Case Management/Adjunct)  Date:  02/24/2023  Time:  10:43 AM  Type of Therapy:  Psychoeducational Skills  Participation Level:  Did Not Attend  Participation Quality:  na  Affect:  na  Cognitive:  na  Insight:  None  Engagement in Group:  na  Modes of Intervention:  na  Summary of Progress/Problems:  Pts were given a podcast to listen to regarding how to impact health through healthy diet choices. Pt did not attend.   Joshua Lewis 02/24/2023, 10:43 AM 

## 2023-02-25 DIAGNOSIS — F332 Major depressive disorder, recurrent severe without psychotic features: Secondary | ICD-10-CM | POA: Diagnosis not present

## 2023-02-25 NOTE — Progress Notes (Signed)
The patient watched television and socialized with peers.  He ate a snack along and accepted his medications as ordered.  Joshua Lewis fell asleep before 2200, voicing no concerns or complaints.  He said that his mood has been improving.  Haines continues to endorse passive SI without a plan.  The patient will be weighed in the morning.

## 2023-02-25 NOTE — Group Note (Signed)
Date:  02/25/2023 Time:  9:14 PM  Group Topic/Focus:  Building Self Esteem:   The Focus of this group is helping patients become aware of the effects of self-esteem on their lives, the things they and others do that enhance or undermine their self-esteem, seeing the relationship between their level of self-esteem and the choices they make and learning ways to enhance self-esteem.    Participation Level:  Active  Participation Quality:  Appropriate  Affect:  Appropriate  Cognitive:  Alert and Appropriate  Insight: Good  Engagement in Group:  Developing/Improving  Modes of Intervention:  Support  Additional Comments:     Hagan Maltz 02/25/2023, 9:14 PM

## 2023-02-25 NOTE — Plan of Care (Signed)
Pt denies SI / HI / AVH. Pt is seen in the milieu but remains in bed most of the shift. Assessment is minimal. No signs of distress or injury. Staff will continue to monitor q15 for safety.    Problem: Education: Goal: Knowledge of General Education information will improve Description: Including pain rating scale, medication(s)/side effects and non-pharmacologic comfort measures Outcome: Not Progressing   Problem: Health Behavior/Discharge Planning: Goal: Ability to manage health-related needs will improve Outcome: Not Progressing   Problem: Clinical Measurements: Goal: Ability to maintain clinical measurements within normal limits will improve Outcome: Not Progressing

## 2023-02-25 NOTE — Group Note (Signed)
Date:  02/25/2023 Time:  11:21 AM  Group Topic/Focus:  Activity Group  Due to the patients being on the unit majority of the day, I encourage the patients to go outside in the courtyard to get some fresh air and some exercise to promote wellbeing physically and mentally.      Participation Level:  Did Not Attend   Joshua Lewis 02/25/2023, 11:21 AM

## 2023-02-25 NOTE — Progress Notes (Signed)
[facility] MD Progress Note  02/25/2023 12:46 PM Joshua Lewis  MRN:  [id number] Subjective: Follow-up patient with major depression and drug abuse.  Feeling better.  No active suicidal thoughts.  Coming out and interacting more.  Complaining of pain in his knee which is chronic. Principal Problem: MDD (major depressive disorder) Diagnosis: Principal Problem:   MDD (major depressive disorder) Active Problems:   Cocaine abuse with cocaine-induced mood disorder (HCC)  Total Time spent with patient: 30 minutes  Past Psychiatric History: Past history of depression and substance use  Past Medical History:  Past Medical History:  Diagnosis Date   Chronic back pain greater than 3 months duration    Kidney carcinoma [facility])     Past Surgical History:  Procedure Laterality Date   ESOPHAGOGASTRODUODENOSCOPY (EGD) WITH PROPOFOL Left 08/09/2020   Procedure: ESOPHAGOGASTRODUODENOSCOPY (EGD) WITH PROPOFOL;  Surgeon: Vida Rigger, MD;  Location: [facility] ENDOSCOPY;  Service: Endoscopy;  Laterality: Left;   HAND SURGERY     HERNIA REPAIR     NEPHRECTOMY     right   Family History:  Family History  Problem Relation Age of Onset   Cancer Mother    Diabetes Mother    Hypertension Mother    Family Psychiatric  History: See previous Social History:  Social History   Substance and Sexual Activity  Alcohol Use Yes   Alcohol/week: 10.0 standard drinks of alcohol   Types: 10 Cans of beer per week     Social History   Substance and Sexual Activity  Drug Use Yes   Frequency: 1.0 times per week   Types: Cocaine    Social History   Socioeconomic History   Marital status: Married    Spouse name: Not on file   Number of children: 2   Years of education: Not on file   Highest education level: Not on file  Occupational History   Not on file  Tobacco Use   Smoking status: Some Days    Packs/day: 0.50    Years: 25.00    Additional pack years: 0.00    Total pack years: 12.50    Types: Cigarettes    Smokeless tobacco: Never  Vaping Use   Vaping Use: Never used  Substance and Sexual Activity   Alcohol use: Yes    Alcohol/week: 10.0 standard drinks of alcohol    Types: 10 Cans of beer per week   Drug use: Yes    Frequency: 1.0 times per week    Types: Cocaine   Sexual activity: Yes  Other Topics Concern   Not on file  Social History Narrative   Not on file   Social Determinants of Health   Financial Resource Strain: Not on file  Food Insecurity: Food Insecurity Present (02/18/2023)   Hunger Vital Sign    Worried About Running Out of Food in the Last Year: Sometimes true    Ran Out of Food in the Last Year: Sometimes true  Transportation Needs: Unmet Transportation Needs (02/18/2023)   PRAPARE - Administrator, Civil Service (Medical): Yes    Lack of Transportation (Non-Medical): Yes  Physical Activity: Not on file  Stress: Not on file  Social Connections: Not on file   Additional Social History:                         Sleep: Fair  Appetite:  Fair  Current Medications: Current Facility-Administered Medications  Medication Dose Route Frequency Provider Last Rate Last Admin  acetaminophen (TYLENOL) tablet 650 mg  650 mg Oral Q4H PRN Eligha Bridegroom, NP   650 mg at 02/22/23 1044   alum & mag hydroxide-simeth (MAALOX/MYLANTA) 200-200-20 MG/5ML suspension 30 mL  30 mL Oral Q6H PRN Eligha Bridegroom, NP       diphenhydrAMINE (BENADRYL) capsule 50 mg  50 mg Oral TID PRN Eligha Bridegroom, NP       Or   diphenhydrAMINE (BENADRYL) injection 50 mg  50 mg Intramuscular TID PRN Eligha Bridegroom, NP       escitalopram (LEXAPRO) tablet 15 mg  15 mg Oral Daily Rylan Kaufmann, Jackquline Denmark, MD   15 mg at 02/25/23 0844   haloperidol (HALDOL) tablet 5 mg  5 mg Oral TID PRN Eligha Bridegroom, NP       Or   haloperidol lactate (HALDOL) injection 5 mg  5 mg Intramuscular TID PRN Eligha Bridegroom, NP       hydrOXYzine (ATARAX) tablet 25 mg  25 mg Oral TID PRN Eligha Bridegroom, NP    25 mg at 02/21/23 1820   ibuprofen (ADVIL) tablet 600 mg  600 mg Oral Q6H PRN Nykole Matos, Jackquline Denmark, MD   600 mg at 02/24/23 1643   LORazepam (ATIVAN) tablet 2 mg  2 mg Oral TID PRN Eligha Bridegroom, NP       Or   LORazepam (ATIVAN) injection 2 mg  2 mg Intramuscular TID PRN Eligha Bridegroom, NP       magnesium hydroxide (MILK OF MAGNESIA) suspension 30 mL  30 mL Oral Daily PRN Eligha Bridegroom, NP       nicotine (NICODERM CQ - dosed in mg/24 hours) patch 14 mg  14 mg Transdermal Daily Jonathon Castelo, Jackquline Denmark, MD   14 mg at 02/25/23 0844   nicotine polacrilex (NICORETTE) gum 2 mg  2 mg Oral PRN Tashi Andujo, Jackquline Denmark, MD       QUEtiapine (SEROQUEL XR) 24 hr tablet 100 mg  100 mg Oral QHS Johngabriel Verde, Jackquline Denmark, MD   100 mg at 02/24/23 2203    Lab Results: No results found for this or any previous visit (from the past 48 hour(s)).  Blood Alcohol level:  Lab Results  Component Value Date   ETH <10 02/16/2023   ETH <10 08/22/2021    Metabolic Disorder Labs: Lab Results  Component Value Date   HGBA1C 5.6 02/21/2023   MPG 114.02 02/21/2023   No results found for: "PROLACTIN" Lab Results  Component Value Date   CHOL 171 02/21/2023   TRIG 54 02/21/2023   HDL 53 02/21/2023   CHOLHDL 3.2 02/21/2023   VLDL 11 02/21/2023   LDLCALC 107 (H) 02/21/2023   LDLCALC 154 (H) 01/28/2020    Physical Findings: AIMS:  , ,  ,  ,    CIWA:    COWS:     Musculoskeletal: Strength & Muscle Tone: within normal limits Gait & Station: normal Patient leans: N/A  Psychiatric Specialty Exam:  Presentation  General Appearance:  Appropriate for Environment; Casual; Neat  Eye Contact: Poor  Speech: Normal Rate  Speech Volume: Decreased  Handedness: Right   Mood and Affect  Mood: Anxious; Depressed  Affect: Flat   Thought Process  Thought Processes: Coherent  Descriptions of Associations:Intact  Orientation:Full (Time, Place and Person)  Thought Content:Logical  History of  Schizophrenia/Schizoaffective disorder:No data recorded Duration of Psychotic Symptoms:No data recorded Hallucinations:No data recorded Ideas of Reference:None  Suicidal Thoughts:No data recorded Homicidal Thoughts:No data recorded  Sensorium  Memory: Immediate Fair; Remote Good  Judgment:  Fair  Insight: Corporate treasurer: Fair  Attention Span: Fair  Recall: [employer] of Knowledge: Fair  Language: Fair   Psychomotor Activity  Psychomotor Activity:No data recorded  Assets  Assets: Manufacturing systems engineer; Desire for Improvement; Financial Resources/Insurance; Social Support   Sleep  Sleep:No data recorded   Physical Exam: Physical Exam Vitals reviewed.  Constitutional:      Appearance: Normal appearance.  HENT:     Head: Normocephalic and atraumatic.     Mouth/Throat:     Pharynx: Oropharynx is clear.  Eyes:     Pupils: Pupils are equal, round, and reactive to light.  Cardiovascular:     Rate and Rhythm: Normal rate and regular rhythm.  Pulmonary:     Effort: Pulmonary effort is normal.     Breath sounds: Normal breath sounds.  Abdominal:     General: Abdomen is flat.     Palpations: Abdomen is soft.  Musculoskeletal:        General: Normal range of motion.  Skin:    General: Skin is warm and dry.  Neurological:     General: No focal deficit present.     Mental Status: He is alert. Mental status is at baseline.  Psychiatric:        Mood and Affect: Mood normal.        Thought Content: Thought content normal.    Review of Systems  Constitutional: Negative.   HENT: Negative.    Eyes: Negative.   Respiratory: Negative.    Cardiovascular: Negative.   Gastrointestinal: Negative.   Musculoskeletal: Negative.   Skin: Negative.   Neurological: Negative.   Psychiatric/Behavioral: Negative.     Blood pressure 105/68, pulse 77, temperature 97.9 F (36.6 C), temperature source Oral, resp. rate 20, height 5\' 9"  (1.753  m), weight 68.5 kg, SpO2 99 %. Body mass index is 22.3 kg/m.   Treatment Plan Summary: Plan patient is wanting pain medicine.  Reminded that he has as needed Motrin ordered.  Encourage group attendance.  He still wants to talk to the "case manager" about substance abuse placement.  I know that social work has talked to me more than once and he is probably dragging his feet a bit about finding any place to go and there may not be a good solution to the problem.  Perhaps will be ready for discharge this week.  Mordecai Rasmussen, MD 02/25/2023, 12:46 PM

## 2023-02-25 NOTE — Progress Notes (Signed)
The patient was visible in the milieu, watched movies, and socialized with peers.  He appeared less anxious and his mood was stable.  Elkin accepted his medications as ordered and denied thoughts of self-harm.  The patient fell asleep before 2200, voicing no concerns or complaints.  Overall improvements in functioning observed.

## 2023-02-26 DIAGNOSIS — F332 Major depressive disorder, recurrent severe without psychotic features: Secondary | ICD-10-CM | POA: Diagnosis not present

## 2023-02-26 MED ORDER — ESCITALOPRAM OXALATE 10 MG PO TABS
20.0000 mg | ORAL_TABLET | Freq: Every day | ORAL | Status: DC
  Administered 2023-02-27 – 2023-03-05 (×7): 20 mg via ORAL
  Filled 2023-02-26 (×7): qty 2

## 2023-02-26 NOTE — Plan of Care (Signed)
D- Patient alert and oriented. Patient presents in a sullen, but pleasant mood on assessment reporting that he slept "fair" last night and had complaints of back pain, but did not request any pain medication from this Clinical research associate. Patient endorsed hopelessness, depression, and anxiety on his self-inventory, not going much into detail, but states "I'm hanging in there". Patient denies SI, HI, AVH at this time. Patient had no stated goals for today.  A- Scheduled medications administered to patient, per MD orders. Support and encouragement provided.  Routine safety checks conducted every 15 minutes.  Patient informed to notify staff with problems or concerns.  R- No adverse drug reactions noted. Patient contracts for safety at this time. Patient compliant with medications and treatment plan. Patient receptive, calm, and cooperative. Patient interacts well with others on the unit. Patient remains safe at this time.  Problem: Education: Goal: Knowledge of General Education information will improve Description: Including pain rating scale, medication(s)/side effects and non-pharmacologic comfort measures Outcome: Not Progressing   Problem: Health Behavior/Discharge Planning: Goal: Ability to manage health-related needs will improve Outcome: Not Progressing   Problem: Clinical Measurements: Goal: Ability to maintain clinical measurements within normal limits will improve Outcome: Not Progressing Goal: Will remain free from infection Outcome: Not Progressing Goal: Diagnostic test results will improve Outcome: Not Progressing Goal: Respiratory complications will improve Outcome: Not Progressing Goal: Cardiovascular complication will be avoided Outcome: Not Progressing   Problem: Activity: Goal: Risk for activity intolerance will decrease Outcome: Not Progressing   Problem: Nutrition: Goal: Adequate nutrition will be maintained Outcome: Not Progressing   Problem: Coping: Goal: Level of anxiety  will decrease Outcome: Not Progressing   Problem: Elimination: Goal: Will not experience complications related to bowel motility Outcome: Not Progressing Goal: Will not experience complications related to urinary retention Outcome: Not Progressing   Problem: Pain Managment: Goal: General experience of comfort will improve Outcome: Not Progressing   Problem: Safety: Goal: Ability to remain free from injury will improve Outcome: Not Progressing   Problem: Skin Integrity: Goal: Risk for impaired skin integrity will decrease Outcome: Not Progressing

## 2023-02-26 NOTE — Progress Notes (Signed)
Patient calm and pleasant during assessment denying SI/HI/AVH, endorses depression and anxiety. Pt observed interacting appropriately with staff and peers on the unit. Pt compliant with medication administration per MD orders. Pt given education, support, and encouragement to be active in his treatment plan. Pt being monitored Q 15 minutes for safety per unit protocol, remains safe on the unit.  

## 2023-02-26 NOTE — Progress Notes (Signed)
[facility] MD Progress Note  02/26/2023 2:45 PM Joshua Lewis  MRN:  [id number] Subjective: Follow-up 62 year old man with depression and cocaine use.  Patient continues to say he feels very depressed and has suicidal thoughts and feels hopeless. Principal Problem: MDD (major depressive disorder) Diagnosis: Principal Problem:   MDD (major depressive disorder) Active Problems:   Cocaine abuse with cocaine-induced mood disorder (HCC)  Total Time spent with patient: 30 minutes  Past Psychiatric History: Past history of cocaine abuse and depression  Past Medical History:  Past Medical History:  Diagnosis Date   Chronic back pain greater than 3 months duration    Kidney carcinoma [facility])     Past Surgical History:  Procedure Laterality Date   ESOPHAGOGASTRODUODENOSCOPY (EGD) WITH PROPOFOL Left 08/09/2020   Procedure: ESOPHAGOGASTRODUODENOSCOPY (EGD) WITH PROPOFOL;  Surgeon: Vida Rigger, MD;  Location: [facility] ENDOSCOPY;  Service: Endoscopy;  Laterality: Left;   HAND SURGERY     HERNIA REPAIR     NEPHRECTOMY     right   Family History:  Family History  Problem Relation Age of Onset   Cancer Mother    Diabetes Mother    Hypertension Mother    Family Psychiatric  History: See previous Social History:  Social History   Substance and Sexual Activity  Alcohol Use Yes   Alcohol/week: 10.0 standard drinks of alcohol   Types: 10 Cans of beer per week     Social History   Substance and Sexual Activity  Drug Use Yes   Frequency: 1.0 times per week   Types: Cocaine    Social History   Socioeconomic History   Marital status: Married    Spouse name: Not on file   Number of children: 2   Years of education: Not on file   Highest education level: Not on file  Occupational History   Not on file  Tobacco Use   Smoking status: Some Days    Packs/day: 0.50    Years: 25.00    Additional pack years: 0.00    Total pack years: 12.50    Types: Cigarettes   Smokeless tobacco: Never  Vaping Use    Vaping Use: Never used  Substance and Sexual Activity   Alcohol use: Yes    Alcohol/week: 10.0 standard drinks of alcohol    Types: 10 Cans of beer per week   Drug use: Yes    Frequency: 1.0 times per week    Types: Cocaine   Sexual activity: Yes  Other Topics Concern   Not on file  Social History Narrative   Not on file   Social Determinants of Health   Financial Resource Strain: Not on file  Food Insecurity: Food Insecurity Present (02/18/2023)   Hunger Vital Sign    Worried About Running Out of Food in the Last Year: Sometimes true    Ran Out of Food in the Last Year: Sometimes true  Transportation Needs: Unmet Transportation Needs (02/18/2023)   PRAPARE - Administrator, Civil Service (Medical): Yes    Lack of Transportation (Non-Medical): Yes  Physical Activity: Not on file  Stress: Not on file  Social Connections: Not on file   Additional Social History:                         Sleep: Fair  Appetite:  Fair  Current Medications: Current Facility-Administered Medications  Medication Dose Route Frequency Provider Last Rate Last Admin   acetaminophen (TYLENOL) tablet 650 mg  650 mg Oral Q4H PRN Eligha Bridegroom, NP   650 mg at 02/22/23 1044   alum & mag hydroxide-simeth (MAALOX/MYLANTA) 200-200-20 MG/5ML suspension 30 mL  30 mL Oral Q6H PRN Eligha Bridegroom, NP       diphenhydrAMINE (BENADRYL) capsule 50 mg  50 mg Oral TID PRN Eligha Bridegroom, NP       Or   diphenhydrAMINE (BENADRYL) injection 50 mg  50 mg Intramuscular TID PRN Eligha Bridegroom, NP       [START ON 02/27/2023] escitalopram (LEXAPRO) tablet 20 mg  20 mg Oral Daily Tnia Anglada T, MD       haloperidol (HALDOL) tablet 5 mg  5 mg Oral TID PRN Eligha Bridegroom, NP       Or   haloperidol lactate (HALDOL) injection 5 mg  5 mg Intramuscular TID PRN Eligha Bridegroom, NP       hydrOXYzine (ATARAX) tablet 25 mg  25 mg Oral TID PRN Eligha Bridegroom, NP   25 mg at 02/21/23 1820   ibuprofen  (ADVIL) tablet 600 mg  600 mg Oral Q6H PRN Asuncion Tapscott, Jackquline Denmark, MD   600 mg at 02/24/23 1643   LORazepam (ATIVAN) tablet 2 mg  2 mg Oral TID PRN Eligha Bridegroom, NP       Or   LORazepam (ATIVAN) injection 2 mg  2 mg Intramuscular TID PRN Eligha Bridegroom, NP       magnesium hydroxide (MILK OF MAGNESIA) suspension 30 mL  30 mL Oral Daily PRN Eligha Bridegroom, NP       nicotine (NICODERM CQ - dosed in mg/24 hours) patch 14 mg  14 mg Transdermal Daily Rosaline Ezekiel, Jackquline Denmark, MD   14 mg at 02/26/23 4098   nicotine polacrilex (NICORETTE) gum 2 mg  2 mg Oral PRN Naiyah Klostermann, Jackquline Denmark, MD       QUEtiapine (SEROQUEL XR) 24 hr tablet 100 mg  100 mg Oral QHS Jove Beyl, Jackquline Denmark, MD   100 mg at 02/25/23 2140    Lab Results: No results found for this or any previous visit (from the past 48 hour(s)).  Blood Alcohol level:  Lab Results  Component Value Date   ETH <10 02/16/2023   ETH <10 08/22/2021    Metabolic Disorder Labs: Lab Results  Component Value Date   HGBA1C 5.6 02/21/2023   MPG 114.02 02/21/2023   No results found for: "PROLACTIN" Lab Results  Component Value Date   CHOL 171 02/21/2023   TRIG 54 02/21/2023   HDL 53 02/21/2023   CHOLHDL 3.2 02/21/2023   VLDL 11 02/21/2023   LDLCALC 107 (H) 02/21/2023   LDLCALC 154 (H) 01/28/2020    Physical Findings: AIMS:  , ,  ,  ,    CIWA:    COWS:     Musculoskeletal: Strength & Muscle Tone: within normal limits Gait & Station: normal Patient leans: N/A  Psychiatric Specialty Exam:  Presentation  General Appearance:  Appropriate for Environment; Casual; Neat  Eye Contact: Poor  Speech: Normal Rate  Speech Volume: Decreased  Handedness: Right   Mood and Affect  Mood: Anxious; Depressed  Affect: Flat   Thought Process  Thought Processes: Coherent  Descriptions of Associations:Intact  Orientation:Full (Time, Place and Person)  Thought Content:Logical  History of Schizophrenia/Schizoaffective disorder:No data  recorded Duration of Psychotic Symptoms:No data recorded Hallucinations:No data recorded Ideas of Reference:None  Suicidal Thoughts:No data recorded Homicidal Thoughts:No data recorded  Sensorium  Memory: Immediate Fair; Remote Good  Judgment: Fair  Insight: Fair  Executive Functions  Concentration: Fair  Attention Span: Fair  Recall: [employer] of Knowledge: Fair  Language: Fair   Psychomotor Activity  Psychomotor Activity:No data recorded  Assets  Assets: Communication Skills; Desire for Improvement; Financial Resources/Insurance; Social Support   Sleep  Sleep:No data recorded   Physical Exam: Physical Exam Constitutional:      Appearance: Normal appearance.  HENT:     Head: Normocephalic and atraumatic.     Mouth/Throat:     Pharynx: Oropharynx is clear.  Eyes:     Pupils: Pupils are equal, round, and reactive to light.  Cardiovascular:     Rate and Rhythm: Normal rate and regular rhythm.  Pulmonary:     Effort: Pulmonary effort is normal.     Breath sounds: Normal breath sounds.  Abdominal:     General: Abdomen is flat.     Palpations: Abdomen is soft.  Musculoskeletal:        General: Normal range of motion.  Skin:    General: Skin is warm and dry.  Neurological:     General: No focal deficit present.     Mental Status: He is alert. Mental status is at baseline.  Psychiatric:        Mood and Affect: Mood is depressed. Affect is blunt.        Thought Content: Thought content normal.    Review of Systems  Constitutional: Negative.   HENT: Negative.    Eyes: Negative.   Respiratory: Negative.    Cardiovascular: Negative.   Gastrointestinal: Negative.   Musculoskeletal: Negative.   Skin: Negative.   Neurological: Negative.   Psychiatric/Behavioral:  Positive for depression and suicidal ideas.    Blood pressure 136/83, pulse 85, temperature 97.9 F (36.6 C), temperature source Oral, resp. rate 20, height 5\' 9"  (1.753 m),  weight 68.5 kg, SpO2 99 %. Body mass index is 22.3 kg/m.   Treatment Plan Summary: Plan patient with little support and minimal coping skills.  Increase Lexapro to 20 mg a day.  Continue to encourage him to work with the treatment team on any kind of options for discharge  Mordecai Rasmussen, MD 02/26/2023, 2:45 PM

## 2023-02-26 NOTE — Group Note (Signed)
Recreation Therapy Group Note   Group Topic:Goal Setting  Group Date: 02/26/2023 Start Time: 1000 End Time: 1100 Facilitators: Rosina Lowenstein, LRT, CTRS Location:  Craft Room  Group Description: Scientist, research (physical sciences). Patients were given many different magazines, a glue stick, markers, and a piece of cardstock paper. LRT and pts discussed the importance of having goals in life. LRT and pts discussed the difference between short-term and long-term goals, as well as what a SMART goal is. LRT encouraged pts to create a vision board, with images they picked and then cut out with safety scissors from the magazine, for themselves, that capture their short and long-term goals. LRT encouraged pts to show and explain their vision board to the group. LRT offered to laminate vision board once dry and complete.   Goal Area(s) Addressed:  Patient will gain knowledge of short vs. long term goals.  Patient will identify goals for themselves. Patient will practice setting SMART goals. Patient will verbalize their goals to LRT and peers.  Affect/Mood: Appropriate   Participation Level: Active and Engaged   Participation Quality: Independent   Behavior: Calm and Cooperative   Speech/Thought Process: Coherent   Insight: Good   Judgement: Good   Modes of Intervention: Art   Patient Response to Interventions:  Attentive, Engaged, Interested , and Receptive   Education Outcome:  Acknowledges education   Clinical Observations/Individualized Feedback: Joshua Lewis was active in their participation of session activities and group discussion. Pt identified Good health, good atmosphere and good spirit" as his goals. Pt appropriately cut out images for identified goals. Pt interacted well with LRT and peers duration of session.   Plan: Continue to engage patient in RT group sessions 2-3x/week.   Rosina Lowenstein, LRT, CTRS 02/26/2023 11:26 AM

## 2023-02-26 NOTE — Progress Notes (Signed)
   02/26/23 1600  Spiritual Encounters  Type of Visit Initial  Care provided to: Patient  Referral source IDT Rounds  OnCall Visit No  Spiritual Framework  Presenting Themes Courage hope and growth   Chaplain was doing rounds and found patient in the day room. Chaplain asked him how he is and how his day was going. He said that he was fine. Patient did not really want to talk today. Chaplain will follow up with him later this week .

## 2023-02-27 DIAGNOSIS — F332 Major depressive disorder, recurrent severe without psychotic features: Secondary | ICD-10-CM | POA: Diagnosis not present

## 2023-02-27 NOTE — Plan of Care (Signed)
?  Problem: Coping: ?Goal: Level of anxiety will decrease ?Outcome: Progressing ?  ?Problem: Safety: ?Goal: Ability to remain free from injury will improve ?Outcome: Progressing ?  ?

## 2023-02-27 NOTE — Progress Notes (Signed)
Patient is pleasant and cooperative. Denies SI, HI, AVH. Endorses sadness, reports he wants to get better so that he can possibly get his family back. Endorses anxiety and depression. No other complaints voiced. Denies pain. No prn requested. Encouragement and support provided. Safety checks maintained. Medications given as prescribed. Pt receptive and remains safe on unit with q 15 min checks.

## 2023-02-27 NOTE — Group Note (Signed)
Date:  02/27/2023 Time:  10:21 PM  Group Topic/Focus:  Wrap-Up Group:   The focus of this group is to help patients review their daily goal of treatment and discuss progress on daily workbooks.    Participation Level:  Active  Participation Quality:  Appropriate, Attentive, and Supportive  Affect:  Appropriate  Cognitive:  Alert and Appropriate  Insight: Appropriate and Good  Engagement in Group:  Developing/Improving and Supportive  Modes of Intervention:  Activity and Support  Additional Comments:     Belva Crome 02/27/2023, 10:21 PM

## 2023-02-27 NOTE — Group Note (Signed)
Date:  02/27/2023 Time:  9:00 AM  Group Topic/Focus:  Goals Group:   The focus of this group is to help patients establish daily goals to achieve during treatment and discuss how the patient can incorporate goal setting into their daily lives to aide in recovery.  Community Group   Participation Level:  Did Not Attend  Merary Garguilo A Jarret Torre 02/27/2023, 11:38 AM

## 2023-02-27 NOTE — Group Note (Signed)
Recreation Therapy Group Note   Group Topic:Problem Solving  Group Date: 02/27/2023 Start Time: 1000 End Time: 1040 Facilitators: Rosina Lowenstein, LRT, CTRS Location:  Craft Room  Group Description: Life Boat. Patients were given the scenario that they are on a boat that is about to become shipwrecked, leaving them stranded on an [place]. They are asked to make a list of 15 different items that they want to take with them when they are stranded on the [place]. Patients are asked to rank their items from most important to least important, #1 being the most important and #15 being the least. Patients will work individually for the first round to come up with 15 items and then pair up with a peer(s) to condense their list and come up with one list of 15 items between the two of them. Patients or LRT will read aloud the 15 different items to the group after each round. LRT facilitated post-activity processing to discuss how this activity can be used in daily life post discharge.   Goal Area(s) Addressed:  Patient will identify priorities, wants and needs. Patient will communicate with LRT and peers. Patient will work collectively as a Administrator, Civil Service. Patient will work on Product manager.   Affect/Mood: N/A   Participation Level: Did not attend    Clinical Observations/Individualized Feedback: Clemente did not attend group due to resting in his room.  Plan: Continue to engage patient in RT group sessions 2-3x/week.   Rosina Lowenstein, LRT, CTRS 02/27/2023 11:05 AM

## 2023-02-27 NOTE — Progress Notes (Signed)
[facility] MD Progress Note  02/27/2023 3:43 PM Joshua Lewis  MRN:  [id number] Subjective: Follow-up patient with depression and cocaine abuse.  No new complaints.  Continues to feel somewhat hopeless about his situation.  Does not appear to have made much progress on finding any kind of discharge plan Principal Problem: MDD (major depressive disorder) Diagnosis: Principal Problem:   MDD (major depressive disorder) Active Problems:   Cocaine abuse with cocaine-induced mood disorder (HCC)  Total Time spent with patient: 30 minutes  Past Psychiatric History: History of cocaine abuse  Past Medical History:  Past Medical History:  Diagnosis Date   Chronic back pain greater than 3 months duration    Kidney carcinoma [facility])     Past Surgical History:  Procedure Laterality Date   ESOPHAGOGASTRODUODENOSCOPY (EGD) WITH PROPOFOL Left 08/09/2020   Procedure: ESOPHAGOGASTRODUODENOSCOPY (EGD) WITH PROPOFOL;  Surgeon: Vida Rigger, MD;  Location: [facility] ENDOSCOPY;  Service: Endoscopy;  Laterality: Left;   HAND SURGERY     HERNIA REPAIR     NEPHRECTOMY     right   Family History:  Family History  Problem Relation Age of Onset   Cancer Mother    Diabetes Mother    Hypertension Mother    Family Psychiatric  History: See above Social History:  Social History   Substance and Sexual Activity  Alcohol Use Yes   Alcohol/week: 10.0 standard drinks of alcohol   Types: 10 Cans of beer per week     Social History   Substance and Sexual Activity  Drug Use Yes   Frequency: 1.0 times per week   Types: Cocaine    Social History   Socioeconomic History   Marital status: Married    Spouse name: Not on file   Number of children: 2   Years of education: Not on file   Highest education level: Not on file  Occupational History   Not on file  Tobacco Use   Smoking status: Some Days    Packs/day: 0.50    Years: 25.00    Additional pack years: 0.00    Total pack years: 12.50    Types: Cigarettes    Smokeless tobacco: Never  Vaping Use   Vaping Use: Never used  Substance and Sexual Activity   Alcohol use: Yes    Alcohol/week: 10.0 standard drinks of alcohol    Types: 10 Cans of beer per week   Drug use: Yes    Frequency: 1.0 times per week    Types: Cocaine   Sexual activity: Yes  Other Topics Concern   Not on file  Social History Narrative   Not on file   Social Determinants of Health   Financial Resource Strain: Not on file  Food Insecurity: Food Insecurity Present (02/18/2023)   Hunger Vital Sign    Worried About Running Out of Food in the Last Year: Sometimes true    Ran Out of Food in the Last Year: Sometimes true  Transportation Needs: Unmet Transportation Needs (02/18/2023)   PRAPARE - Administrator, Civil Service (Medical): Yes    Lack of Transportation (Non-Medical): Yes  Physical Activity: Not on file  Stress: Not on file  Social Connections: Not on file   Additional Social History:                         Sleep: Fair  Appetite:  Fair  Current Medications: Current Facility-Administered Medications  Medication Dose Route Frequency Provider Last Rate Last  Admin   acetaminophen (TYLENOL) tablet 650 mg  650 mg Oral Q4H PRN Eligha Bridegroom, NP   650 mg at 02/22/23 1044   alum & mag hydroxide-simeth (MAALOX/MYLANTA) 200-200-20 MG/5ML suspension 30 mL  30 mL Oral Q6H PRN Eligha Bridegroom, NP       diphenhydrAMINE (BENADRYL) capsule 50 mg  50 mg Oral TID PRN Eligha Bridegroom, NP       Or   diphenhydrAMINE (BENADRYL) injection 50 mg  50 mg Intramuscular TID PRN Eligha Bridegroom, NP       escitalopram (LEXAPRO) tablet 20 mg  20 mg Oral Daily Deanthony Maull, Jackquline Denmark, MD   20 mg at 02/27/23 9604   haloperidol (HALDOL) tablet 5 mg  5 mg Oral TID PRN Eligha Bridegroom, NP       Or   haloperidol lactate (HALDOL) injection 5 mg  5 mg Intramuscular TID PRN Eligha Bridegroom, NP       hydrOXYzine (ATARAX) tablet 25 mg  25 mg Oral TID PRN Eligha Bridegroom, NP    25 mg at 02/27/23 1440   ibuprofen (ADVIL) tablet 600 mg  600 mg Oral Q6H PRN Daila Elbert, Jackquline Denmark, MD   600 mg at 02/24/23 1643   LORazepam (ATIVAN) tablet 2 mg  2 mg Oral TID PRN Eligha Bridegroom, NP       Or   LORazepam (ATIVAN) injection 2 mg  2 mg Intramuscular TID PRN Eligha Bridegroom, NP       magnesium hydroxide (MILK OF MAGNESIA) suspension 30 mL  30 mL Oral Daily PRN Eligha Bridegroom, NP       nicotine (NICODERM CQ - dosed in mg/24 hours) patch 14 mg  14 mg Transdermal Daily Polina Burmaster, Jackquline Denmark, MD   14 mg at 02/27/23 5409   nicotine polacrilex (NICORETTE) gum 2 mg  2 mg Oral PRN Natthew Marlatt, Jackquline Denmark, MD       QUEtiapine (SEROQUEL XR) 24 hr tablet 100 mg  100 mg Oral QHS Tyshawn Keel, Jackquline Denmark, MD   100 mg at 02/26/23 2140    Lab Results: No results found for this or any previous visit (from the past 48 hour(s)).  Blood Alcohol level:  Lab Results  Component Value Date   ETH <10 02/16/2023   ETH <10 08/22/2021    Metabolic Disorder Labs: Lab Results  Component Value Date   HGBA1C 5.6 02/21/2023   MPG 114.02 02/21/2023   No results found for: "PROLACTIN" Lab Results  Component Value Date   CHOL 171 02/21/2023   TRIG 54 02/21/2023   HDL 53 02/21/2023   CHOLHDL 3.2 02/21/2023   VLDL 11 02/21/2023   LDLCALC 107 (H) 02/21/2023   LDLCALC 154 (H) 01/28/2020    Physical Findings: AIMS:  , ,  ,  ,    CIWA:    COWS:     Musculoskeletal: Strength & Muscle Tone: within normal limits Gait & Station: normal Patient leans: N/A  Psychiatric Specialty Exam:  Presentation  General Appearance:  Appropriate for Environment; Casual; Neat  Eye Contact: Poor  Speech: Normal Rate  Speech Volume: Decreased  Handedness: Right   Mood and Affect  Mood: Anxious; Depressed  Affect: Flat   Thought Process  Thought Processes: Coherent  Descriptions of Associations:Intact  Orientation:Full (Time, Place and Person)  Thought Content:Logical  History of  Schizophrenia/Schizoaffective disorder:No data recorded Duration of Psychotic Symptoms:No data recorded Hallucinations:No data recorded Ideas of Reference:None  Suicidal Thoughts:No data recorded Homicidal Thoughts:No data recorded  Sensorium  Memory: Immediate Fair; Remote  Good  Judgment: Fair  Insight: Fair   Chartered certified accountant: Fair  Attention Span: Fair  Recall: Jennelle Human of Knowledge: Fair  Language: Fair   Psychomotor Activity  Psychomotor Activity:No data recorded  Assets  Assets: Communication Skills; Desire for Improvement; Financial Resources/Insurance; Social Support   Sleep  Sleep:No data recorded   Physical Exam: Physical Exam Constitutional:      Appearance: Normal appearance.  HENT:     Head: Normocephalic and atraumatic.     Mouth/Throat:     Pharynx: Oropharynx is clear.  Eyes:     Pupils: Pupils are equal, round, and reactive to light.  Cardiovascular:     Rate and Rhythm: Normal rate and regular rhythm.  Pulmonary:     Effort: Pulmonary effort is normal.     Breath sounds: Normal breath sounds.  Abdominal:     General: Abdomen is flat.     Palpations: Abdomen is soft.  Musculoskeletal:        General: Normal range of motion.  Skin:    General: Skin is warm and dry.  Neurological:     General: No focal deficit present.     Mental Status: He is alert. Mental status is at baseline.  Psychiatric:        Mood and Affect: Mood normal.        Thought Content: Thought content normal.    Review of Systems  Constitutional: Negative.   HENT: Negative.    Eyes: Negative.   Respiratory: Negative.    Cardiovascular: Negative.   Gastrointestinal: Negative.   Musculoskeletal: Negative.   Skin: Negative.   Neurological: Negative.   Psychiatric/Behavioral: Negative.     Blood pressure (!) 131/93, pulse 69, temperature 97.9 F (36.6 C), temperature source Oral, resp. rate 20, height 5\' 9"  (1.753 m), weight  68.5 kg, SpO2 99 %. Body mass index is 22.3 kg/m.   Treatment Plan Summary: Plan patient has requested to be transferred to behavioral health Hospital in [place].  I would have no problem with this whatsoever and have forwarded his request to the [facility]  Meanwhile patient is fairly stable  Mordecai Rasmussen, MD 02/27/2023, 3:43 PM

## 2023-02-27 NOTE — Progress Notes (Signed)
Patient denies SI, HI, and AVH. He rated anxiety and depression as a 7/10. He denies pain and other physical problems. He reports good sleep and appetite. He is compliant with scheduled medication and remains safe on the unit at this time.

## 2023-02-28 NOTE — Progress Notes (Signed)
Pt is pleasant and cooperative, he denies any S/I , H/I. Or AVH. Pt is contracting for safety. He is complaint with medications and atended groups .

## 2023-02-28 NOTE — Group Note (Signed)
Date:  02/28/2023 Time:  9:32 PM  Group Topic/Focus:  Wrap-Up Group:   The focus of this group is to help patients review their daily goal of treatment and discuss progress on daily workbooks.    Participation Level:  Active  Participation Quality:  Appropriate and Attentive  Affect:  Appropriate  Cognitive:  Alert and Appropriate  Insight: Appropriate and Good  Engagement in Group:  Developing/Improving  Modes of Intervention:  Limit-setting  Additional Comments:     Belva Crome 02/28/2023, 9:32 PM

## 2023-02-28 NOTE — Progress Notes (Signed)
[facility] MD Progress Note  02/28/2023 3:16 PM Joshua Lewis  MRN:  [id number] Subjective: Follow-up patient with depression and cocaine abuse.  No new complaints.  Continues to feel somewhat hopeless about his situation.  Does not appear to have made much progress on finding any kind of discharge plan Principal Problem: MDD (major depressive disorder) Diagnosis: Principal Problem:   MDD (major depressive disorder) Active Problems:   Cocaine abuse with cocaine-induced mood disorder (HCC)  Total Time spent with patient: 30 minutes  Past Psychiatric History: History of cocaine abuse  Past Medical History:  Past Medical History:  Diagnosis Date   Chronic back pain greater than 3 months duration    Kidney carcinoma [facility])     Past Surgical History:  Procedure Laterality Date   ESOPHAGOGASTRODUODENOSCOPY (EGD) WITH PROPOFOL Left 08/09/2020   Procedure: ESOPHAGOGASTRODUODENOSCOPY (EGD) WITH PROPOFOL;  Surgeon: Vida Rigger, MD;  Location: [facility] ENDOSCOPY;  Service: Endoscopy;  Laterality: Left;   HAND SURGERY     HERNIA REPAIR     NEPHRECTOMY     right   Family History:  Family History  Problem Relation Age of Onset   Cancer Mother    Diabetes Mother    Hypertension Mother    Family Psychiatric  History: See above Social History:  Social History   Substance and Sexual Activity  Alcohol Use Yes   Alcohol/week: 10.0 standard drinks of alcohol   Types: 10 Cans of beer per week     Social History   Substance and Sexual Activity  Drug Use Yes   Frequency: 1.0 times per week   Types: Cocaine    Social History   Socioeconomic History   Marital status: Married    Spouse name: Not on file   Number of children: 2   Years of education: Not on file   Highest education level: Not on file  Occupational History   Not on file  Tobacco Use   Smoking status: Some Days    Packs/day: 0.50    Years: 25.00    Additional pack years: 0.00    Total pack years: 12.50    Types: Cigarettes    Smokeless tobacco: Never  Vaping Use   Vaping Use: Never used  Substance and Sexual Activity   Alcohol use: Yes    Alcohol/week: 10.0 standard drinks of alcohol    Types: 10 Cans of beer per week   Drug use: Yes    Frequency: 1.0 times per week    Types: Cocaine   Sexual activity: Yes  Other Topics Concern   Not on file  Social History Narrative   Not on file   Social Determinants of Health   Financial Resource Strain: Not on file  Food Insecurity: Food Insecurity Present (02/18/2023)   Hunger Vital Sign    Worried About Running Out of Food in the Last Year: Sometimes true    Ran Out of Food in the Last Year: Sometimes true  Transportation Needs: Unmet Transportation Needs (02/18/2023)   PRAPARE - Administrator, Civil Service (Medical): Yes    Lack of Transportation (Non-Medical): Yes  Physical Activity: Not on file  Stress: Not on file  Social Connections: Not on file   Additional Social History:                         Sleep: Fair  Appetite:  Fair  Current Medications: Current Facility-Administered Medications  Medication Dose Route Frequency Provider Last Rate Last  Admin   acetaminophen (TYLENOL) tablet 650 mg  650 mg Oral Q4H PRN Eligha Bridegroom, NP   650 mg at 02/22/23 1044   alum & mag hydroxide-simeth (MAALOX/MYLANTA) 200-200-20 MG/5ML suspension 30 mL  30 mL Oral Q6H PRN Eligha Bridegroom, NP       diphenhydrAMINE (BENADRYL) capsule 50 mg  50 mg Oral TID PRN Eligha Bridegroom, NP       Or   diphenhydrAMINE (BENADRYL) injection 50 mg  50 mg Intramuscular TID PRN Eligha Bridegroom, NP       escitalopram (LEXAPRO) tablet 20 mg  20 mg Oral Daily Latania Bascomb, Jackquline Denmark, MD   20 mg at 02/28/23 3474   haloperidol (HALDOL) tablet 5 mg  5 mg Oral TID PRN Eligha Bridegroom, NP       Or   haloperidol lactate (HALDOL) injection 5 mg  5 mg Intramuscular TID PRN Eligha Bridegroom, NP       hydrOXYzine (ATARAX) tablet 25 mg  25 mg Oral TID PRN Eligha Bridegroom, NP    25 mg at 02/27/23 1440   ibuprofen (ADVIL) tablet 600 mg  600 mg Oral Q6H PRN Lucinda Spells, Jackquline Denmark, MD   600 mg at 02/24/23 1643   LORazepam (ATIVAN) tablet 2 mg  2 mg Oral TID PRN Eligha Bridegroom, NP       Or   LORazepam (ATIVAN) injection 2 mg  2 mg Intramuscular TID PRN Eligha Bridegroom, NP       magnesium hydroxide (MILK OF MAGNESIA) suspension 30 mL  30 mL Oral Daily PRN Eligha Bridegroom, NP       nicotine (NICODERM CQ - dosed in mg/24 hours) patch 14 mg  14 mg Transdermal Daily Shuree Brossart, Jackquline Denmark, MD   14 mg at 02/28/23 2595   nicotine polacrilex (NICORETTE) gum 2 mg  2 mg Oral PRN Iliana Hutt, Jackquline Denmark, MD       QUEtiapine (SEROQUEL XR) 24 hr tablet 100 mg  100 mg Oral QHS Stoy Fenn, Jackquline Denmark, MD   100 mg at 02/27/23 2103    Lab Results: No results found for this or any previous visit (from the past 48 hour(s)).  Blood Alcohol level:  Lab Results  Component Value Date   ETH <10 02/16/2023   ETH <10 08/22/2021    Metabolic Disorder Labs: Lab Results  Component Value Date   HGBA1C 5.6 02/21/2023   MPG 114.02 02/21/2023   No results found for: "PROLACTIN" Lab Results  Component Value Date   CHOL 171 02/21/2023   TRIG 54 02/21/2023   HDL 53 02/21/2023   CHOLHDL 3.2 02/21/2023   VLDL 11 02/21/2023   LDLCALC 107 (H) 02/21/2023   LDLCALC 154 (H) 01/28/2020    Physical Findings: AIMS:  , ,  ,  ,    CIWA:    COWS:     Musculoskeletal: Strength & Muscle Tone: within normal limits Gait & Station: normal Patient leans: N/A  Psychiatric Specialty Exam:  Presentation  General Appearance:  Appropriate for Environment; Casual; Neat  Eye Contact: Poor  Speech: Normal Rate  Speech Volume: Decreased  Handedness: Right   Mood and Affect  Mood: Anxious; Depressed  Affect: Flat   Thought Process  Thought Processes: Coherent  Descriptions of Associations:Intact  Orientation:Full (Time, Place and Person)  Thought Content:Logical  History of  Schizophrenia/Schizoaffective disorder:No data recorded Duration of Psychotic Symptoms:No data recorded Hallucinations:No data recorded Ideas of Reference:None  Suicidal Thoughts:No data recorded Homicidal Thoughts:No data recorded  Sensorium  Memory: Immediate Fair; Remote  Good  Judgment: Fair  Insight: Fair   Chartered certified accountant: Fair  Attention Span: Fair  Recall: Jennelle Human of Knowledge: Fair  Language: Fair   Psychomotor Activity  Psychomotor Activity:No data recorded  Assets  Assets: Communication Skills; Desire for Improvement; Financial Resources/Insurance; Social Support   Sleep  Sleep:No data recorded   Physical Exam: Physical Exam Vitals reviewed.  Constitutional:      Appearance: Normal appearance.  HENT:     Head: Normocephalic and atraumatic.     Mouth/Throat:     Pharynx: Oropharynx is clear.  Eyes:     Pupils: Pupils are equal, round, and reactive to light.  Cardiovascular:     Rate and Rhythm: Normal rate and regular rhythm.  Pulmonary:     Effort: Pulmonary effort is normal.     Breath sounds: Normal breath sounds.  Abdominal:     General: Abdomen is flat.     Palpations: Abdomen is soft.  Musculoskeletal:        General: Normal range of motion.  Skin:    General: Skin is warm and dry.  Neurological:     General: No focal deficit present.     Mental Status: He is alert. Mental status is at baseline.  Psychiatric:        Mood and Affect: Mood normal.        Thought Content: Thought content normal.    Review of Systems  Constitutional: Negative.   HENT: Negative.    Eyes: Negative.   Respiratory: Negative.    Cardiovascular: Negative.   Gastrointestinal: Negative.   Musculoskeletal: Negative.   Skin: Negative.   Neurological: Negative.   Psychiatric/Behavioral: Negative.     Blood pressure (!) 120/92, pulse 76, temperature 98.4 F (36.9 C), temperature source Oral, resp. rate 19, height 5\' 9"   (1.753 m), weight 68.5 kg, SpO2 99 %. Body mass index is 22.3 kg/m.   Treatment Plan Summary: Plan patient has requested to be transferred to behavioral health Hospital in [place].  I would have no problem with this whatsoever and have forwarded his request to the [facility]  Meanwhile patient is fairly stable  Mordecai Rasmussen, MD 02/28/2023, 3:16 PM

## 2023-02-28 NOTE — Group Note (Signed)
Recreation Therapy Group Note   Group Topic:Relaxation  Group Date: 02/28/2023 Start Time: 1045 End Time: 1125 Facilitators: Rosina Lowenstein, LRT, CTRS Location:  Craft Room  Group Description: PMR (Progressive Muscle Relaxation). LRT asks patients their current level of stress/anxiety from 1-10, with 10 being the highest. LRT educates patients on what PMR is and the benefits that come from it. Patients are asked to sit with their feet flat on the floor while sitting up and all the way back in their chair, if possible. LRT and pts follow a prompt through a speaker that requires you to tense and release different muscles in their body and focus on their breathing. During session, lights are off and soft music is being played. At the end of the prompt, LRT asks patients to rank their current levels of stress/anxiety from 1-10, 10 being the highest.   Goal Area(s) Addressed:  Patients will be able to describe progressive muscle relaxation.  Patient will practice using relaxation technique. Patient will identify a new coping skill.  Patient will follow multistep directions to reduce anxiety and stress.  Affect/Mood: Appropriate   Participation Level: Active and Engaged   Participation Quality: Independent   Behavior: Appropriate and Restless   Speech/Thought Process: Coherent   Insight: Good   Judgement: Good   Modes of Intervention: Activity   Patient Response to Interventions:  Receptive   Education Outcome:  Acknowledges education   Clinical Observations/Individualized Feedback: Baxter was active in their participation of session activities and group discussion. Pt identified that his stress and anxiety were a 9 before the session. After, he rated his stress and anxiety a 5. Pt interacted well with LRT and peers duration of session.   Plan: Continue to engage patient in RT group sessions 2-3x/week.   Rosina Lowenstein, LRT, CTRS 02/28/2023 11:40 AM

## 2023-02-28 NOTE — Plan of Care (Signed)
D- Patient alert and oriented.  Denies SI, HI, AVH,  Pt denies any pain. Pt is compliant with medications. Pt attending groups and is pleasant and cooperative.  A- Scheduled medications administered to patient, per MD orders. Support and encouragement provided.  Routine safety checks conducted every 15 minutes.  Patient informed to notify staff with problems or concerns.  R- No adverse drug reactions noted. Patient contracts for safety at this time. Patient compliant with medications and treatment plan. Patient receptive, calm, and cooperative. Patient interacts well with others on the unit.  Patient remains safe at this time.

## 2023-02-28 NOTE — Progress Notes (Signed)
   02/28/23 2200  Psych Admission Type (Psych Patients Only)  Admission Status Voluntary  Psychosocial Assessment  Patient Complaints Depression  Eye Contact Fair  Facial Expression Anxious  Affect Appropriate to circumstance  Speech Logical/coherent  Interaction Assertive  Motor Activity Slow  Appearance/Hygiene Unremarkable  Behavior Characteristics Cooperative  Mood Pleasant  Aggressive Behavior  Effect No apparent injury  Thought Process  Coherency Circumstantial  Content WDL  Delusions None reported or observed  Perception WDL  Hallucination None reported or observed  Judgment WDL  Confusion None  Danger to Self  Current suicidal ideation? Denies  Danger to Others  Danger to Others None reported or observed

## 2023-02-28 NOTE — Group Note (Unsigned)
Date:  02/28/2023 Time:  9:16 PM  Group Topic/Focus:  Wrap-Up Group:   The focus of this group is to help patients review their daily goal of treatment and discuss progress on daily workbooks.     Participation Level:  {BHH PARTICIPATION YQIHK:74259}  Participation Quality:  {BHH PARTICIPATION QUALITY:22265}  Affect:  {BHH AFFECT:22266}  Cognitive:  {BHH COGNITIVE:22267}  Insight: {BHH Insight2:20797}  Engagement in Group:  {BHH ENGAGEMENT IN DGLOV:56433}  Modes of Intervention:  {BHH MODES OF INTERVENTION:22269}  Additional Comments:  ***  Kylee Nardozzi 02/28/2023, 9:16 PM

## 2023-03-01 DIAGNOSIS — F332 Major depressive disorder, recurrent severe without psychotic features: Secondary | ICD-10-CM | POA: Diagnosis not present

## 2023-03-01 MED ORDER — ENSURE ENLIVE PO LIQD
237.0000 mL | Freq: Two times a day (BID) | ORAL | Status: DC
  Administered 2023-03-03: 237 mL via ORAL

## 2023-03-01 MED ORDER — ENSURE ENLIVE PO LIQD
237.0000 mL | Freq: Three times a day (TID) | ORAL | Status: DC
  Administered 2023-03-01 – 2023-03-05 (×12): 237 mL via ORAL

## 2023-03-01 MED ORDER — GABAPENTIN 300 MG PO CAPS
300.0000 mg | ORAL_CAPSULE | Freq: Three times a day (TID) | ORAL | Status: DC
  Administered 2023-03-01 – 2023-03-02 (×5): 300 mg via ORAL
  Filled 2023-03-01 (×5): qty 1

## 2023-03-01 NOTE — BH IP Treatment Plan (Signed)
Interdisciplinary Treatment and Diagnostic Plan Update  03/01/2023 Time of Session: 08:30 Joshua Lewis MRN: [id number]  Principal Diagnosis: MDD (major depressive disorder)  Secondary Diagnoses: Principal Problem:   MDD (major depressive disorder) Active Problems:   Cocaine abuse with cocaine-induced mood disorder (HCC)   Current Medications:  Current Facility-Administered Medications  Medication Dose Route Frequency Provider Last Rate Last Admin   acetaminophen (TYLENOL) tablet 650 mg  650 mg Oral Q4H PRN Eligha Bridegroom, NP   650 mg at 02/22/23 1044   alum & mag hydroxide-simeth (MAALOX/MYLANTA) 200-200-20 MG/5ML suspension 30 mL  30 mL Oral Q6H PRN Eligha Bridegroom, NP       diphenhydrAMINE (BENADRYL) capsule 50 mg  50 mg Oral TID PRN Eligha Bridegroom, NP       Or   diphenhydrAMINE (BENADRYL) injection 50 mg  50 mg Intramuscular TID PRN Eligha Bridegroom, NP       escitalopram (LEXAPRO) tablet 20 mg  20 mg Oral Daily Clapacs, John T, MD   20 mg at 03/01/23 8295   haloperidol (HALDOL) tablet 5 mg  5 mg Oral TID PRN Eligha Bridegroom, NP       Or   haloperidol lactate (HALDOL) injection 5 mg  5 mg Intramuscular TID PRN Eligha Bridegroom, NP       hydrOXYzine (ATARAX) tablet 25 mg  25 mg Oral TID PRN Eligha Bridegroom, NP   25 mg at 02/28/23 2134   ibuprofen (ADVIL) tablet 600 mg  600 mg Oral Q6H PRN Clapacs, Jackquline Denmark, MD   600 mg at 02/24/23 1643   LORazepam (ATIVAN) tablet 2 mg  2 mg Oral TID PRN Eligha Bridegroom, NP       Or   LORazepam (ATIVAN) injection 2 mg  2 mg Intramuscular TID PRN Eligha Bridegroom, NP       magnesium hydroxide (MILK OF MAGNESIA) suspension 30 mL  30 mL Oral Daily PRN Eligha Bridegroom, NP       nicotine (NICODERM CQ - dosed in mg/24 hours) patch 14 mg  14 mg Transdermal Daily Clapacs, John T, MD   14 mg at 03/01/23 0815   nicotine polacrilex (NICORETTE) gum 2 mg  2 mg Oral PRN Clapacs, Jackquline Denmark, MD       QUEtiapine (SEROQUEL XR) 24 hr tablet 100 mg  100 mg Oral QHS  Clapacs, Jackquline Denmark, MD   100 mg at 02/27/23 2103   PTA Medications: No medications prior to admission.    Patient Stressors: Financial difficulties   Marital or family conflict   Substance abuse    Patient Strengths: Automotive engineer for treatment/growth   Treatment Modalities: Medication Management, Group therapy, Case management,  1 to 1 session with clinician, Psychoeducation, Recreational therapy.   Physician Treatment Plan for Primary Diagnosis: MDD (major depressive disorder) Long Term Goal(s): Improvement in symptoms so as ready for discharge   Short Term Goals: Ability to demonstrate self-control will improve Ability to identify triggers associated with substance abuse/mental health issues will improve Ability to identify changes in lifestyle to reduce recurrence of condition will improve Compliance with prescribed medications will improve  Medication Management: Evaluate patient's response, side effects, and tolerance of medication regimen.  Therapeutic Interventions: 1 to 1 sessions, Unit Group sessions and Medication administration.  Evaluation of Outcomes: Progressing  Physician Treatment Plan for Secondary Diagnosis: Principal Problem:   MDD (major depressive disorder) Active Problems:   Cocaine abuse with cocaine-induced mood disorder (HCC)  Long Term Goal(s): Improvement in symptoms so  as ready for discharge   Short Term Goals: Ability to demonstrate self-control will improve Ability to identify triggers associated with substance abuse/mental health issues will improve Ability to identify changes in lifestyle to reduce recurrence of condition will improve Compliance with prescribed medications will improve     Medication Management: Evaluate patient's response, side effects, and tolerance of medication regimen.  Therapeutic Interventions: 1 to 1 sessions, Unit Group sessions and Medication administration.  Evaluation of Outcomes:  Progressing   RN Treatment Plan for Primary Diagnosis: MDD (major depressive disorder) Long Term Goal(s): Knowledge of disease and therapeutic regimen to maintain health will improve  Short Term Goals: Ability to remain free from injury will improve, Ability to verbalize frustration and anger appropriately will improve, Ability to demonstrate self-control, Ability to participate in decision making will improve, Ability to verbalize feelings will improve, Ability to disclose and discuss suicidal ideas, Ability to identify and develop effective coping behaviors will improve, and Compliance with prescribed medications will improve  Medication Management: RN will administer medications as ordered by provider, will assess and evaluate patient's response and provide education to patient for prescribed medication. RN will report any adverse and/or side effects to prescribing provider.  Therapeutic Interventions: 1 on 1 counseling sessions, Psychoeducation, Medication administration, Evaluate responses to treatment, Monitor vital signs and CBGs as ordered, Perform/monitor CIWA, COWS, AIMS and Fall Risk screenings as ordered, Perform wound care treatments as ordered.  Evaluation of Outcomes: Progressing   LCSW Treatment Plan for Primary Diagnosis: MDD (major depressive disorder) Long Term Goal(s): Safe transition to appropriate next level of care at discharge, Engage patient in therapeutic group addressing interpersonal concerns.  Short Term Goals: Engage patient in aftercare planning with referrals and resources, Increase social support, Increase ability to appropriately verbalize feelings, Increase emotional regulation, Facilitate acceptance of mental health diagnosis and concerns, Facilitate patient progression through stages of change regarding substance use diagnoses and concerns, Identify triggers associated with mental health/substance abuse issues, and Increase skills for wellness and  recovery  Therapeutic Interventions: Assess for all discharge needs, 1 to 1 time with Social worker, Explore available resources and support systems, Assess for adequacy in community support network, Educate family and significant other(s) on suicide prevention, Complete Psychosocial Assessment, Interpersonal group therapy.  Evaluation of Outcomes: Progressing   Progress in Treatment: Attending groups: Yes. and No. Participating in groups: Yes. Taking medication as prescribed: Yes. Toleration medication: Yes. Family/Significant other contact made: No, will contact:  attempts have been made to contact wife, Elery Foulkes. Patient understands diagnosis: Yes. Discussing patient identified problems/goals with staff: Yes. Medical problems stabilized or resolved: Yes. Denies suicidal/homicidal ideation: Yes. Issues/concerns per patient self-inventory: No. Other: none.  New problem(s) identified: No, Describe:  none   New Short Term/Long Term Goal(s): detox, elimination of symptoms of psychosis, medication management for mood stabilization; elimination of SI thoughts; development of comprehensive mental wellness/sobriety plan. Update 02/24/23: no changes at this time. Update 03/01/23: No changes at this time.   Patient Goals:  "Getting my mind back the way it used to be. I've fell into a full blown depression stage." Update 02/24/23: no changes at this time. Update 03/01/23: No changes at this time.   Discharge Plan or Barriers: CSW will assist pt with development of an appropriate aftercare/discharge plan. Update 02/24/23: no changes at this time. Update 03/01/23: No changes at this time.     Reason for Continuation of Hospitalization: Depression Medication stabilization Suicidal ideation   Estimated Length of Stay: 1-7 days Update 02/24/23:  TBD Update 03/01/23: No changes at this time.  Last 3 [place] Suicide Severity Risk Score: Flowsheet Row Admission (Current) from [facility]/17/2023 in [facility]  INPATIENT BEHAVIORAL MEDICINE ED from 02/16/2023 in [facility] at [facility]) [facility]  C-SSRS RISK CATEGORY No Risk High Risk High Risk       Last PHQ 2/9 Scores:    02/17/2020   11:16 AM  Depression screen PHQ 2/9  Decreased Interest 0  Down, Depressed, Hopeless 0  PHQ - 2 Score 0    Scribe for Treatment Team: Glenis Smoker, LCSW 03/01/2023 9:51 AM

## 2023-03-01 NOTE — Plan of Care (Signed)
D- Patient alert and oriented.  Denies SI, HI, AVH, and pain. Pt is focused on a call he did not receive yesterday. Pt would like for staff to sort through all previous call and retreive the number for him. This writer instructed pt this is not possible.Writer checked chart for the number he wanted ,but its not available in the chart. Pt is ittitable about this.   A- Scheduled medications administered to patient, per MD orders. Support and encouragement provided.  Routine safety checks conducted every 15 minutes.  Patient informed to notify staff with problems or concerns.  R- No adverse drug reactions noted. Patient contracts for safety at this time. Patient compliant with medications and treatment plan. Patient receptive, calm, and cooperative. Patient interacts well with others on the unit.  Patient remains safe at this time.

## 2023-03-01 NOTE — Progress Notes (Signed)
Pt is pleasant and cooperative,compliant with medications. Pt denies any S/I or H/I or AVH. Pt has not attended groups today. Pt has asked for double portions and ensure supplements.

## 2023-03-01 NOTE — Group Note (Signed)
Date:  03/01/2023 Time:  9:56 PM  Group Topic/Focus:  Wrap-Up Group:   The focus of this group is to help patients review their daily goal of treatment and discuss progress on daily workbooks.    Participation Level:  Active  Participation Quality:  Appropriate  Affect:  Appropriate  Cognitive:  Appropriate  Insight: Good  Engagement in Group:  Engaged  Modes of Intervention:  Education  Additional Comments:    [name] 03/01/2023, 9:56 PM

## 2023-03-01 NOTE — Progress Notes (Signed)
Patient calm and pleasant during assessment denying SI/HI/AVH, endorses depression and anxiety. Pt observed interacting appropriately with staff and peers on the unit. Pt refused his night medication. Pt given education, still refused. Pt given education, support, and encouragement to be active in his treatment plan. Pt being monitored Q 15 minutes for safety per unit protocol, remains safe on the unit.

## 2023-03-01 NOTE — Group Note (Signed)
Date:  03/01/2023 Time:  10:50 AM  Group Topic/Focus:  Activity Group  We encourage the patients to go outside so they can get some fresh air which can assist for their well-being mentally and physically.    Participation Level:  Did Not Attend   Olita Takeshita M Evely Gainey 03/01/2023, 10:50 AM  

## 2023-03-01 NOTE — Group Note (Signed)
[facility] LCSW Group Therapy Note   Group Date: 03/01/2023 Start Time: 1315 End Time: 1400   Type of Therapy/Topic:  Group Therapy:  Balance in Life  Participation Level:  Did Not Attend   Description of Group:    This group will address the concept of balance and how it feels and looks when one is unbalanced. Patients will be encouraged to process areas in their lives that are out of balance, and identify reasons for remaining unbalanced. Facilitators will guide patients utilizing problem- solving interventions to address and correct the stressor making their life unbalanced. Understanding and applying boundaries will be explored and addressed for obtaining  and maintaining a balanced life. Patients will be encouraged to explore ways to assertively make their unbalanced needs known to significant others in their lives, using other group members and facilitator for support and feedback.  Therapeutic Goals: Patient will identify two or more emotions or situations they have that consume much of in their lives. Patient will identify signs/triggers that life has become out of balance:  Patient will identify two ways to set boundaries in order to achieve balance in their lives:  Patient will demonstrate ability to communicate their needs through discussion and/or role plays  Summary of Patient Progress:    X    Therapeutic Modalities:   Cognitive Behavioral Therapy Solution-Focused Therapy Assertiveness Training   Elza Rafter, LCSWA

## 2023-03-01 NOTE — Group Note (Signed)
Recreation Therapy Group Note   Group Topic:Emotion Expression  Group Date: 03/01/2023 Start Time: 1000 End Time: 1035 Facilitators: Rosina Lowenstein, LRT, CTRS Location:  Craft Room  Group Description: Gratitude Journaling. Patients and LRT discussed what gratitude means, how we can express it and what it means to [place], personally. LRT gave an education handout on the definition of gratitude that also gave different examples of gratitude exercises that they could try. One of the examples was "Gratitude Letter", which prompted you to write a letter to someone you appreciate. LRT played soft music while everyone wrote their letter. Once letter was completed, LRT encouraged people to read their letter, if they wanted to, or share who they wrote it to, at minimum. LRT and pts processed how showing gratitude towards themselves, and others can be applied to everyday life post-discharge.   Goal Area(s) Addressed:  Patient will identify the definition of gratitude. Patient will learn different gratitude exercises. Patient will practice writing/journaling as a coping skill.   Affect/Mood: N/A   Participation Level: Did not attend    Clinical Observations/Individualized Feedback: Patient did not attend due to resting in their room.  Plan: Continue to engage patient in RT group sessions 2-3x/week.   Rosina Lowenstein, LRT, CTRS 03/01/2023 11:15 AM

## 2023-03-01 NOTE — Progress Notes (Signed)
[facility] MD Progress Note  03/01/2023 1:49 PM Joshua Lewis  MRN:  [id number] Subjective: Patient has no new complaints except that his chronic pain is bothering him and he wants gabapentin.  No suicidal ideation.  Homeless. Principal Problem: MDD (major depressive disorder) Diagnosis: Principal Problem:   MDD (major depressive disorder) Active Problems:   Cocaine abuse with cocaine-induced mood disorder (HCC)  Total Time spent with patient: 30 minutes  Past Psychiatric History: Past history of substance abuse and depression  Past Medical History:  Past Medical History:  Diagnosis Date   Chronic back pain greater than 3 months duration    Kidney carcinoma [facility])     Past Surgical History:  Procedure Laterality Date   ESOPHAGOGASTRODUODENOSCOPY (EGD) WITH PROPOFOL Left 08/09/2020   Procedure: ESOPHAGOGASTRODUODENOSCOPY (EGD) WITH PROPOFOL;  Surgeon: Vida Rigger, MD;  Location: [facility] ENDOSCOPY;  Service: Endoscopy;  Laterality: Left;   HAND SURGERY     HERNIA REPAIR     NEPHRECTOMY     right   Family History:  Family History  Problem Relation Age of Onset   Cancer Mother    Diabetes Mother    Hypertension Mother    Family Psychiatric  History: See previous Social History:  Social History   Substance and Sexual Activity  Alcohol Use Yes   Alcohol/week: 10.0 standard drinks of alcohol   Types: 10 Cans of beer per week     Social History   Substance and Sexual Activity  Drug Use Yes   Frequency: 1.0 times per week   Types: Cocaine    Social History   Socioeconomic History   Marital status: Married    Spouse name: Not on file   Number of children: 2   Years of education: Not on file   Highest education level: Not on file  Occupational History   Not on file  Tobacco Use   Smoking status: Some Days    Packs/day: 0.50    Years: 25.00    Additional pack years: 0.00    Total pack years: 12.50    Types: Cigarettes   Smokeless tobacco: Never  Vaping Use   Vaping Use:  Never used  Substance and Sexual Activity   Alcohol use: Yes    Alcohol/week: 10.0 standard drinks of alcohol    Types: 10 Cans of beer per week   Drug use: Yes    Frequency: 1.0 times per week    Types: Cocaine   Sexual activity: Yes  Other Topics Concern   Not on file  Social History Narrative   Not on file   Social Determinants of Health   Financial Resource Strain: Not on file  Food Insecurity: Food Insecurity Present (02/18/2023)   Hunger Vital Sign    Worried About Running Out of Food in the Last Year: Sometimes true    Ran Out of Food in the Last Year: Sometimes true  Transportation Needs: Unmet Transportation Needs (02/18/2023)   PRAPARE - Administrator, Civil Service (Medical): Yes    Lack of Transportation (Non-Medical): Yes  Physical Activity: Not on file  Stress: Not on file  Social Connections: Not on file   Additional Social History:                         Sleep: Fair  Appetite:  Fair  Current Medications: Current Facility-Administered Medications  Medication Dose Route Frequency Provider Last Rate Last Admin   acetaminophen (TYLENOL) tablet 650 mg  650  mg Oral Q4H PRN Eligha Bridegroom, NP   650 mg at 02/22/23 1044   alum & mag hydroxide-simeth (MAALOX/MYLANTA) 200-200-20 MG/5ML suspension 30 mL  30 mL Oral Q6H PRN Eligha Bridegroom, NP       diphenhydrAMINE (BENADRYL) capsule 50 mg  50 mg Oral TID PRN Eligha Bridegroom, NP       Or   diphenhydrAMINE (BENADRYL) injection 50 mg  50 mg Intramuscular TID PRN Eligha Bridegroom, NP       escitalopram (LEXAPRO) tablet 20 mg  20 mg Oral Daily Jaydin Jalomo, Jackquline Denmark, MD   20 mg at 03/01/23 1610   feeding supplement (ENSURE ENLIVE / ENSURE PLUS) liquid 237 mL  237 mL Oral BID BM Mart Colpitts, Jackquline Denmark, MD       gabapentin (NEURONTIN) capsule 300 mg  300 mg Oral TID Kelseigh Diver, Jackquline Denmark, MD   300 mg at 03/01/23 1133   haloperidol (HALDOL) tablet 5 mg  5 mg Oral TID PRN Eligha Bridegroom, NP       Or   haloperidol  lactate (HALDOL) injection 5 mg  5 mg Intramuscular TID PRN Eligha Bridegroom, NP       hydrOXYzine (ATARAX) tablet 25 mg  25 mg Oral TID PRN Eligha Bridegroom, NP   25 mg at 02/28/23 2134   ibuprofen (ADVIL) tablet 600 mg  600 mg Oral Q6H PRN Kailin Principato, Jackquline Denmark, MD   600 mg at 02/24/23 1643   LORazepam (ATIVAN) tablet 2 mg  2 mg Oral TID PRN Eligha Bridegroom, NP       Or   LORazepam (ATIVAN) injection 2 mg  2 mg Intramuscular TID PRN Eligha Bridegroom, NP       magnesium hydroxide (MILK OF MAGNESIA) suspension 30 mL  30 mL Oral Daily PRN Eligha Bridegroom, NP       nicotine (NICODERM CQ - dosed in mg/24 hours) patch 14 mg  14 mg Transdermal Daily Huckleberry Martinson T, MD   14 mg at 03/01/23 9604   nicotine polacrilex (NICORETTE) gum 2 mg  2 mg Oral PRN Carrigan Delafuente, Jackquline Denmark, MD       QUEtiapine (SEROQUEL XR) 24 hr tablet 100 mg  100 mg Oral QHS Demarrion Meiklejohn, Jackquline Denmark, MD   100 mg at 02/27/23 2103    Lab Results: No results found for this or any previous visit (from the past 48 hour(s)).  Blood Alcohol level:  Lab Results  Component Value Date   ETH <10 02/16/2023   ETH <10 08/22/2021    Metabolic Disorder Labs: Lab Results  Component Value Date   HGBA1C 5.6 02/21/2023   MPG 114.02 02/21/2023   No results found for: "PROLACTIN" Lab Results  Component Value Date   CHOL 171 02/21/2023   TRIG 54 02/21/2023   HDL 53 02/21/2023   CHOLHDL 3.2 02/21/2023   VLDL 11 02/21/2023   LDLCALC 107 (H) 02/21/2023   LDLCALC 154 (H) 01/28/2020    Physical Findings: AIMS:  , ,  ,  ,    CIWA:    COWS:     Musculoskeletal: Strength & Muscle Tone: within normal limits Gait & Station: normal Patient leans: Right  Psychiatric Specialty Exam:  Presentation  General Appearance:  Appropriate for Environment; Casual; Neat  Eye Contact: Poor  Speech: Normal Rate  Speech Volume: Decreased  Handedness: Right   Mood and Affect  Mood: Anxious; Depressed  Affect: Flat   Thought Process  Thought  Processes: Coherent  Descriptions of Associations:Intact  Orientation:Full (Time, Place and  Person)  Thought Content:Logical  History of Schizophrenia/Schizoaffective disorder:No data recorded Duration of Psychotic Symptoms:No data recorded Hallucinations:No data recorded Ideas of Reference:None  Suicidal Thoughts:No data recorded Homicidal Thoughts:No data recorded  Sensorium  Memory: Immediate Fair; Remote Good  Judgment: Fair  Insight: Fair   Chartered certified accountant: Fair  Attention Span: Fair  Recall: [employer] of Knowledge: Fair  Language: Fair   Psychomotor Activity  Psychomotor Activity:No data recorded  Assets  Assets: Communication Skills; Desire for Improvement; Financial Resources/Insurance; Social Support   Sleep  Sleep:No data recorded   Physical Exam: Physical Exam Vitals and nursing note reviewed.  Constitutional:      Appearance: Normal appearance.  HENT:     Head: Normocephalic and atraumatic.     Mouth/Throat:     Pharynx: Oropharynx is clear.  Eyes:     Pupils: Pupils are equal, round, and reactive to light.  Cardiovascular:     Rate and Rhythm: Normal rate and regular rhythm.  Pulmonary:     Effort: Pulmonary effort is normal.     Breath sounds: Normal breath sounds.  Abdominal:     General: Abdomen is flat.     Palpations: Abdomen is soft.  Musculoskeletal:        General: Normal range of motion.  Skin:    General: Skin is warm and dry.  Neurological:     General: No focal deficit present.     Mental Status: He is alert. Mental status is at baseline.  Psychiatric:        Attention and Perception: Attention normal.        Mood and Affect: Mood normal.        Speech: Speech normal.        Behavior: Behavior normal.        Thought Content: Thought content normal.        Cognition and Memory: Cognition normal.    Review of Systems  Constitutional: Negative.   HENT: Negative.    Eyes: Negative.    Respiratory: Negative.    Cardiovascular: Negative.   Gastrointestinal: Negative.   Musculoskeletal: Negative.   Skin: Negative.   Neurological: Negative.   Psychiatric/Behavioral: Negative.     Blood pressure 126/80, pulse 75, temperature 97.9 F (36.6 C), temperature source Oral, resp. rate 19, height 5\' 9"  (1.753 m), weight 68.5 kg, SpO2 99 %. Body mass index is 22.3 kg/m.   Treatment Plan Summary: Plan patient is planning to stay here until he gets his check at the first of the month.  Encouraged him meanwhile to make use of every resource to improve his mood and give him a better chance of recovery.  Added the gabapentin at his request.  Joshua Rasmussen, MD 03/01/2023, 1:49 PM

## 2023-03-01 NOTE — Group Note (Signed)
Date:  03/01/2023 Time:  7:38 AM  Group Topic/Focus:  Music therapy. Outside/Courtyard     Participation Level:  Did Not Attend   Joshua Lewis L [name] 03/01/2023, 7:38 AM  

## 2023-03-02 DIAGNOSIS — F332 Major depressive disorder, recurrent severe without psychotic features: Secondary | ICD-10-CM | POA: Diagnosis not present

## 2023-03-02 NOTE — Progress Notes (Addendum)
Pt A & O X4. Denies SI, HI, AVH and pain when assessed. Reports he slept well last night with good appetite. However, he rated his depression, anxiety and hopelessness all 6/10 with current stressor being his current family dynamics. Per pt "I still feel down, my wife and my boys left me down here and went to [place]. There's no plan of rejoining them. They want me to figure it out on my own". Attended scheduled Rec therapy group and engaged in activities. Remains medication compliant without adverse drug reactions. Emotional support, encouragement and  offered this shift.  Pt tolerated meals and fluids well. Safety checks maintained at Q 15 minutes intervals without incident. Reassured about his wallet and phone being locked up with security "Well, I need my information in 48 hours [facility]". Pt encouraged to speak to his assigned CSW over the weekend to assist him process his paperwork for [facility] placement.

## 2023-03-02 NOTE — [facility] Counselor (Signed)
CSW was approached by patient who stated that he would like a transfer to [facility] to be near his 63 year old son that was living in a car.    CSW discussed this further with the patient who later stated the son to be aged 6, then 31.  Patient stated that his sone was living with a friend and sometimes would go out to the car when he was upset.  Story was not clear and patient proved to be a poor historian.  CSW spoke with the patient's former spouse about son and his living arrangements. Per wife the son is 55 years old, however, he stays in the home with a friend that she pays to watch her son.  She reports that she does have an adult son who could stay with the same peer, however, declines to and not has housing instability between the friends, his friends and his vehicle.  She reports that this son is 63.  Penni Homans, MSW, LCSW 03/02/2023 4:33 PM

## 2023-03-02 NOTE — Group Note (Signed)
Recreation Therapy Group Note   Group Topic:Leisure Education  Group Date: 03/02/2023 Start Time: 1000 End Time: 1100 Facilitators: Rosina Lowenstein, LRT, CTRS Location:  Craft Room  Group Description: Leisure. Patients were given the option to choose from coloring mandalas, singing karaoke, playing cards, or making origami. LRT and pts discussed the meaning of leisure, the importance of participating in leisure during their free time/when they're outside of the hospital, as well as how our leisure interests can also serve as coping skills. Pt identified two leisure interests and shared with the group.   Goal Area(s) Addressed:  Patient will identify a current leisure interest.  Patient will learn the definition of "leisure". Patient will practice making a positive decision. Patient will have the opportunity to try a new leisure activity. Patient will communicate with peers and LRT.   Affect/Mood: Flat   Participation Level: Moderate and Engaged   Participation Quality: Independent   Behavior: Appropriate and Calm   Speech/Thought Process: Coherent   Insight: Good   Judgement: Good   Modes of Intervention: Activity   Patient Response to Interventions:  Receptive   Education Outcome:  Acknowledges education   Clinical Observations/Individualized Feedback: Joshua Lewis was mostly active in their participation of session activities and group discussion. Pt identified "sleep and watch TV" as things that he does in his free time. Pt came to group about halfway through, and joined with no issue. Pt requested to hear a song by Ames Dura. Pt interacted well with LRT and peers duration of session.   Plan: Continue to engage patient in RT group sessions 2-3x/week.   Rosina Lowenstein, LRT, CTRS 03/02/2023 11:20 AM

## 2023-03-02 NOTE — Progress Notes (Signed)
[facility] MD Progress Note  03/02/2023 11:56 AM Joshua Lewis  MRN:  [id number] Subjective: Follow-up for this 62 year old man with substance abuse and depression.  Patient seen and chart reviewed.  Patient states that he is sadder today.  It sounds like he just found out that his wife does not want him to have her phone number suggesting that she is really trying to cut contact with him.  Patient was tearful during the assessment.  More sad and hopeless.  No acting out behavior no suicidal behavior no suicidal intent and no psychosis. Principal Problem: MDD (major depressive disorder) Diagnosis: Principal Problem:   MDD (major depressive disorder) Active Problems:   Cocaine abuse with cocaine-induced mood disorder (HCC)  Total Time spent with patient: 30 minutes  Past Psychiatric History: Past history of longstanding substance abuse problems and depression  Past Medical History:  Past Medical History:  Diagnosis Date   Chronic back pain greater than 3 months duration    Kidney carcinoma [facility])     Past Surgical History:  Procedure Laterality Date   ESOPHAGOGASTRODUODENOSCOPY (EGD) WITH PROPOFOL Left 08/09/2020   Procedure: ESOPHAGOGASTRODUODENOSCOPY (EGD) WITH PROPOFOL;  Surgeon: Vida Rigger, MD;  Location: [facility] ENDOSCOPY;  Service: Endoscopy;  Laterality: Left;   HAND SURGERY     HERNIA REPAIR     NEPHRECTOMY     right   Family History:  Family History  Problem Relation Age of Onset   Cancer Mother    Diabetes Mother    Hypertension Mother    Family Psychiatric  History: See previous Social History:  Social History   Substance and Sexual Activity  Alcohol Use Yes   Alcohol/week: 10.0 standard drinks of alcohol   Types: 10 Cans of beer per week     Social History   Substance and Sexual Activity  Drug Use Yes   Frequency: 1.0 times per week   Types: Cocaine    Social History   Socioeconomic History   Marital status: Married    Spouse name: Not on file   Number of  children: 2   Years of education: Not on file   Highest education level: Not on file  Occupational History   Not on file  Tobacco Use   Smoking status: Some Days    Packs/day: 0.50    Years: 25.00    Additional pack years: 0.00    Total pack years: 12.50    Types: Cigarettes   Smokeless tobacco: Never  Vaping Use   Vaping Use: Never used  Substance and Sexual Activity   Alcohol use: Yes    Alcohol/week: 10.0 standard drinks of alcohol    Types: 10 Cans of beer per week   Drug use: Yes    Frequency: 1.0 times per week    Types: Cocaine   Sexual activity: Yes  Other Topics Concern   Not on file  Social History Narrative   Not on file   Social Determinants of Health   Financial Resource Strain: Not on file  Food Insecurity: Food Insecurity Present (02/18/2023)   Hunger Vital Sign    Worried About Running Out of Food in the Last Year: Sometimes true    Ran Out of Food in the Last Year: Sometimes true  Transportation Needs: Unmet Transportation Needs (02/18/2023)   PRAPARE - Administrator, Civil Service (Medical): Yes    Lack of Transportation (Non-Medical): Yes  Physical Activity: Not on file  Stress: Not on file  Social Connections: Not on file  Additional Social History:                         Sleep: Fair  Appetite:  Fair  Current Medications: Current Facility-Administered Medications  Medication Dose Route Frequency Provider Last Rate Last Admin   acetaminophen (TYLENOL) tablet 650 mg  650 mg Oral Q4H PRN Eligha Bridegroom, NP   650 mg at 02/22/23 1044   alum & mag hydroxide-simeth (MAALOX/MYLANTA) 200-200-20 MG/5ML suspension 30 mL  30 mL Oral Q6H PRN Eligha Bridegroom, NP       diphenhydrAMINE (BENADRYL) capsule 50 mg  50 mg Oral TID PRN Eligha Bridegroom, NP       Or   diphenhydrAMINE (BENADRYL) injection 50 mg  50 mg Intramuscular TID PRN Eligha Bridegroom, NP       escitalopram (LEXAPRO) tablet 20 mg  20 mg Oral Daily Kalim Kissel, Jackquline Denmark, MD    20 mg at 03/02/23 0813   feeding supplement (ENSURE ENLIVE / ENSURE PLUS) liquid 237 mL  237 mL Oral BID BM Gabrille Kilbride, Jackquline Denmark, MD       feeding supplement (ENSURE ENLIVE / ENSURE PLUS) liquid 237 mL  237 mL Oral TID BM Ayaz Sondgeroth T, MD   237 mL at 03/01/23 2116   gabapentin (NEURONTIN) capsule 300 mg  300 mg Oral TID Massimiliano Rohleder T, MD   300 mg at 03/02/23 0813   haloperidol (HALDOL) tablet 5 mg  5 mg Oral TID PRN Eligha Bridegroom, NP       Or   haloperidol lactate (HALDOL) injection 5 mg  5 mg Intramuscular TID PRN Eligha Bridegroom, NP       hydrOXYzine (ATARAX) tablet 25 mg  25 mg Oral TID PRN Eligha Bridegroom, NP   25 mg at 02/28/23 2134   ibuprofen (ADVIL) tablet 600 mg  600 mg Oral Q6H PRN Mykaela Arena, Jackquline Denmark, MD   600 mg at 02/24/23 1643   LORazepam (ATIVAN) tablet 2 mg  2 mg Oral TID PRN Eligha Bridegroom, NP       Or   LORazepam (ATIVAN) injection 2 mg  2 mg Intramuscular TID PRN Eligha Bridegroom, NP       magnesium hydroxide (MILK OF MAGNESIA) suspension 30 mL  30 mL Oral Daily PRN Eligha Bridegroom, NP       nicotine (NICODERM CQ - dosed in mg/24 hours) patch 14 mg  14 mg Transdermal Daily Alysah Carton, Jackquline Denmark, MD   14 mg at 03/02/23 0815   nicotine polacrilex (NICORETTE) gum 2 mg  2 mg Oral PRN Arieanna Pressey, Jackquline Denmark, MD   2 mg at 03/01/23 1435   QUEtiapine (SEROQUEL XR) 24 hr tablet 100 mg  100 mg Oral QHS Davie Claud, Jackquline Denmark, MD   100 mg at 02/27/23 2103    Lab Results: No results found for this or any previous visit (from the past 48 hour(s)).  Blood Alcohol level:  Lab Results  Component Value Date   ETH <10 02/16/2023   ETH <10 08/22/2021    Metabolic Disorder Labs: Lab Results  Component Value Date   HGBA1C 5.6 02/21/2023   MPG 114.02 02/21/2023   No results found for: "PROLACTIN" Lab Results  Component Value Date   CHOL 171 02/21/2023   TRIG 54 02/21/2023   HDL 53 02/21/2023   CHOLHDL 3.2 02/21/2023   VLDL 11 02/21/2023   LDLCALC 107 (H) 02/21/2023   LDLCALC 154 (H) 01/28/2020     Physical Findings: AIMS:  , ,  ,  ,  CIWA:    COWS:     Musculoskeletal: Strength & Muscle Tone: within normal limits Gait & Station: normal Patient leans: N/A  Psychiatric Specialty Exam:  Presentation  General Appearance:  Appropriate for Environment; Casual; Neat  Eye Contact: Poor  Speech: Normal Rate  Speech Volume: Decreased  Handedness: Right   Mood and Affect  Mood: Anxious; Depressed  Affect: Flat   Thought Process  Thought Processes: Coherent  Descriptions of Associations:Intact  Orientation:Full (Time, Place and Person)  Thought Content:Logical  History of Schizophrenia/Schizoaffective disorder:No data recorded Duration of Psychotic Symptoms:No data recorded Hallucinations:No data recorded Ideas of Reference:None  Suicidal Thoughts:No data recorded Homicidal Thoughts:No data recorded  Sensorium  Memory: Immediate Fair; Remote Good  Judgment: Fair  Insight: Fair   Art therapist  Concentration: Fair  Attention Span: Fair  Recall: [employer] of Knowledge: Fair  Language: Fair   Psychomotor Activity  Psychomotor Activity:No data recorded  Assets  Assets: Communication Skills; Desire for Improvement; Financial Resources/Insurance; Social Support   Sleep  Sleep:No data recorded   Physical Exam: Physical Exam Vitals and nursing note reviewed.  Constitutional:      Appearance: Normal appearance.  HENT:     Head: Normocephalic and atraumatic.     Mouth/Throat:     Pharynx: Oropharynx is clear.  Eyes:     Pupils: Pupils are equal, round, and reactive to light.  Cardiovascular:     Rate and Rhythm: Normal rate and regular rhythm.  Pulmonary:     Effort: Pulmonary effort is normal.     Breath sounds: Normal breath sounds.  Abdominal:     General: Abdomen is flat.     Palpations: Abdomen is soft.  Musculoskeletal:        General: Normal range of motion.  Skin:    General: Skin is warm and  dry.  Neurological:     General: No focal deficit present.     Mental Status: He is alert. Mental status is at baseline.  Psychiatric:        Attention and Perception: Attention normal.        Mood and Affect: Mood is depressed. Affect is tearful.        Speech: Speech is delayed.        Behavior: Behavior is withdrawn.        Thought Content: Thought content normal.    Review of Systems  Constitutional: Negative.   HENT: Negative.    Eyes: Negative.   Respiratory: Negative.    Cardiovascular: Negative.   Gastrointestinal: Negative.   Musculoskeletal: Negative.   Skin: Negative.   Neurological: Negative.   Psychiatric/Behavioral:  Positive for depression and substance abuse. The patient is nervous/anxious.    Blood pressure 126/80, pulse 75, temperature 97.9 F (36.6 C), temperature source Oral, resp. rate 19, height 5\' 9"  (1.753 m), weight 68.5 kg, SpO2 99 %. Body mass index is 22.3 kg/m.   Treatment Plan Summary: Medication management and Plan I already have put his Lexapro up to 20 mg.  No change to medicine.  Supportive counseling and encouragement.  Encouraged him and request that he attend groups anyway.  Still sad and hopeless and hoping for some kind of placement  Mordecai Rasmussen, MD 03/02/2023, 11:56 AM

## 2023-03-02 NOTE — Group Note (Signed)
[facility] LCSW Group Therapy Note   Group Date: 03/02/2023 Start Time: 1315 End Time: 1415  Type of Therapy and Topic:  Group Therapy:  Feelings around Relapse and Recovery  Participation Level:  Did Not Attend   Description of Group:    Patients in this group will discuss emotions they experience before and after a relapse. They will process how experiencing these feelings, or avoidance of experiencing them, relates to having a relapse. Facilitator will guide patients to explore emotions they have related to recovery. Patients will be encouraged to process which emotions are more powerful. They will be guided to discuss the emotional reaction significant others in their lives may have to patients' relapse or recovery. Patients will be assisted in exploring ways to respond to the emotions of others without this contributing to a relapse.  Therapeutic Goals: Patient will identify two or more emotions that lead to relapse for them:  Patient will identify two emotions that result when they relapse:  Patient will identify two emotions related to recovery:  Patient will demonstrate ability to communicate their needs through discussion and/or role plays.   Summary of Patient Progress: X   Therapeutic Modalities:   Cognitive Behavioral Therapy Solution-Focused Therapy Assertiveness Training Relapse Prevention Therapy   Waynesha Rammel R Ferdinando Lodge, LCSW 

## 2023-03-03 DIAGNOSIS — F332 Major depressive disorder, recurrent severe without psychotic features: Secondary | ICD-10-CM | POA: Diagnosis not present

## 2023-03-03 MED ORDER — GABAPENTIN 300 MG PO CAPS
600.0000 mg | ORAL_CAPSULE | Freq: Three times a day (TID) | ORAL | Status: DC
  Administered 2023-03-03 – 2023-03-05 (×8): 600 mg via ORAL
  Filled 2023-03-03 (×8): qty 2

## 2023-03-03 NOTE — Progress Notes (Signed)
Pt present with congruent affect, fair eye contact, logical speech and ambulatory in milieu with steady gait. Denies SI, HI, AVH and pain when assessed. Rates his anxiety, depression and hopelessness all 7/10 still related to poor family dynamics due to his history of substance use. Expressed his gratitude about belongings (cell phone & wallet) being found by security. Emotional support, encouragement and reassurance offered. Pt remains medication compliant without adverse drug reactions. Tolerated meals and fluids well. Attended scheduled groups with prompts. Safety checks maintained at Q 15 minutes intervals. Support, encouragement and reassurance offered.

## 2023-03-03 NOTE — Progress Notes (Signed)
[facility] MD Progress Note  03/03/2023 7:42 AM Joshua Lewis  MRN:  [id number] Subjective:  She was evaluated on February 18, 2023 due to homelessness suicidal ideations and worsening depression.  Also, experiencing auditory hallucinations.  Today, patient is calm and cooperative he is amicable.  Tells me that he has made positive progress since his admission.  Hallucinations are now low occasionally telling him to break out.  Did sleep well last night.  Reports having throbbing pain on his left forearm.  Patient did not attend any groups yesterday and looks despondent regarding staff nursing.  Several of her friends [facility].  Denies safety concerns.  Patient laments that his family habits moved to [place] without him.  Says that they got tired of his depression but also states that he was being very irritable and angry with him because of it.  I was screaming calling them names and throwing stuff around.  Principal Problem: MDD (major depressive disorder) Diagnosis: Principal Problem:   MDD (major depressive disorder) Active Problems:   Cocaine abuse with cocaine-induced mood disorder (HCC)  Total Time spent with patient: 30 minutes  Past Psychiatric History: Past history of longstanding substance abuse problems and depression  Past Medical History:  Past Medical History:  Diagnosis Date   Chronic back pain greater than 3 months duration    Kidney carcinoma [facility])     Past Surgical History:  Procedure Laterality Date   ESOPHAGOGASTRODUODENOSCOPY (EGD) WITH PROPOFOL Left 08/09/2020   Procedure: ESOPHAGOGASTRODUODENOSCOPY (EGD) WITH PROPOFOL;  Surgeon: Vida Rigger, MD;  Location: [facility] ENDOSCOPY;  Service: Endoscopy;  Laterality: Left;   HAND SURGERY     HERNIA REPAIR     NEPHRECTOMY     right   Family History:  Family History  Problem Relation Age of Onset   Cancer Mother    Diabetes Mother    Hypertension Mother    Family  Psychiatric  History: See previous Social History:  Social History   Substance and Sexual Activity  Alcohol Use Yes   Alcohol/week: 10.0 standard drinks of alcohol   Types: 10 Cans of beer per week     Social History   Substance and Sexual Activity  Drug Use Yes   Frequency: 1.0 times per week   Types: Cocaine    Social History   Socioeconomic History   Marital status: Married    Spouse name: Not on file   Number of children: 2   Years of education: Not on file   Highest education level: Not on file  Occupational History   Not on file  Tobacco Use   Smoking status: Some Days    Packs/day: 0.50    Years: 25.00    Additional pack years: 0.00    Total pack years: 12.50    Types: Cigarettes   Smokeless tobacco: Never  Vaping Use   Vaping Use: Never used  Substance and Sexual Activity   Alcohol use: Yes    Alcohol/week: 10.0 standard drinks of alcohol    Types: 10 Cans of beer per week   Drug use: Yes    Frequency: 1.0 times per week    Types: Cocaine   Sexual activity: Yes  Other Topics Concern   Not on file  Social History Narrative   Not on file   Social Determinants of Health   Financial Resource Strain: Not on file  Food Insecurity: Food Insecurity Present (02/18/2023)   Hunger Vital Sign  Worried About Programme researcher, broadcasting/film/video in the Last Year: Sometimes true    Barista in the Last Year: Sometimes true  Transportation Needs: Unmet Transportation Needs (02/18/2023)   PRAPARE - Administrator, Civil Service (Medical): Yes    Lack of Transportation (Non-Medical): Yes  Physical Activity: Not on file  Stress: Not on file  Social Connections: Not on file   Additional Social History:                         Sleep: Fair  Appetite:  Fair  Current Medications: Current Facility-Administered Medications  Medication Dose Route Frequency Provider Last Rate Last Admin   acetaminophen (TYLENOL) tablet 650 mg  650 mg Oral Q4H PRN  Eligha Bridegroom, NP   650 mg at 02/22/23 1044   alum & mag hydroxide-simeth (MAALOX/MYLANTA) 200-200-20 MG/5ML suspension 30 mL  30 mL Oral Q6H PRN Eligha Bridegroom, NP       diphenhydrAMINE (BENADRYL) capsule 50 mg  50 mg Oral TID PRN Eligha Bridegroom, NP       Or   diphenhydrAMINE (BENADRYL) injection 50 mg  50 mg Intramuscular TID PRN Eligha Bridegroom, NP       escitalopram (LEXAPRO) tablet 20 mg  20 mg Oral Daily Clapacs, John T, MD   20 mg at 03/02/23 0813   feeding supplement (ENSURE ENLIVE / ENSURE PLUS) liquid 237 mL  237 mL Oral BID BM Clapacs, John T, MD       feeding supplement (ENSURE ENLIVE / ENSURE PLUS) liquid 237 mL  237 mL Oral TID BM Clapacs, John T, MD   237 mL at 03/02/23 2100   gabapentin (NEURONTIN) capsule 300 mg  300 mg Oral TID Clapacs, John T, MD   300 mg at 03/02/23 1736   haloperidol (HALDOL) tablet 5 mg  5 mg Oral TID PRN Eligha Bridegroom, NP       Or   haloperidol lactate (HALDOL) injection 5 mg  5 mg Intramuscular TID PRN Eligha Bridegroom, NP       hydrOXYzine (ATARAX) tablet 25 mg  25 mg Oral TID PRN Eligha Bridegroom, NP   25 mg at 02/28/23 2134   ibuprofen (ADVIL) tablet 600 mg  600 mg Oral Q6H PRN Clapacs, Jackquline Denmark, MD   600 mg at 02/24/23 1643   LORazepam (ATIVAN) tablet 2 mg  2 mg Oral TID PRN Eligha Bridegroom, NP       Or   LORazepam (ATIVAN) injection 2 mg  2 mg Intramuscular TID PRN Eligha Bridegroom, NP       magnesium hydroxide (MILK OF MAGNESIA) suspension 30 mL  30 mL Oral Daily PRN Eligha Bridegroom, NP       nicotine (NICODERM CQ - dosed in mg/24 hours) patch 14 mg  14 mg Transdermal Daily Clapacs, Jackquline Denmark, MD   14 mg at 03/02/23 0815   nicotine polacrilex (NICORETTE) gum 2 mg  2 mg Oral PRN Clapacs, Jackquline Denmark, MD   2 mg at 03/02/23 1550   QUEtiapine (SEROQUEL XR) 24 hr tablet 100 mg  100 mg Oral QHS Clapacs, Jackquline Denmark, MD   100 mg at 02/27/23 2103    Lab Results: No results found for this or any previous visit (from the past 48 hour(s)).  Blood Alcohol  level:  Lab Results  Component Value Date   [facility] <10 02/16/2023   ETH <10 08/22/2021    Metabolic Disorder Labs: Lab Results  Component Value Date   HGBA1C 5.6 02/21/2023   MPG 114.02 02/21/2023   No results found for: "PROLACTIN" Lab Results  Component Value Date   CHOL 171 02/21/2023   TRIG 54 02/21/2023   HDL 53 02/21/2023   CHOLHDL 3.2 02/21/2023   VLDL 11 02/21/2023   LDLCALC 107 (H) 02/21/2023   LDLCALC 154 (H) 01/28/2020    Physical Findings: AIMS:  , ,  ,  ,    CIWA:    COWS:     Musculoskeletal: Strength & Muscle Tone: within normal limits Gait & Station: normal Patient leans: N/A  Psychiatric Specialty Exam:  Presentation  General Appearance:  Appropriate for Environment; Casual; Neat  Eye Contact: Poor  Speech: Normal Rate  Speech Volume: Decreased  Handedness: Right   Mood and Affect  Mood: Anxious; Depressed  Affect: Flat   Thought Process  Thought Processes: Coherent  Descriptions of Associations:Intact  Orientation:Full (Time, Place and Person)  Thought Content:Logical  History of Schizophrenia/Schizoaffective disorder:No data recorded Duration of Psychotic Symptoms:No data recorded Hallucinations:No data recorded Ideas of Reference:None  Suicidal Thoughts:No data recorded Homicidal Thoughts:No data recorded  Sensorium  Memory: Immediate Fair; Remote Good  Judgment: Fair  Insight: Fair   Art therapist  Concentration: Fair  Attention Span: Fair  Recall: [employer] of Knowledge: Fair  Language: Fair   Psychomotor Activity  Psychomotor Activity:No data recorded  Assets  Assets: Communication Skills; Desire for Improvement; Financial Resources/Insurance; Social Support   Sleep  Sleep:No data recorded   Physical Exam: Physical Exam Vitals and nursing note reviewed.  Constitutional:      Appearance: Normal appearance.  HENT:     Head: Normocephalic and atraumatic.      Mouth/Throat:     Pharynx: Oropharynx is clear.  Eyes:     Pupils: Pupils are equal, round, and reactive to light.  Cardiovascular:     Rate and Rhythm: Normal rate and regular rhythm.  Pulmonary:     Effort: Pulmonary effort is normal.     Breath sounds: Normal breath sounds.  Abdominal:     General: Abdomen is flat.     Palpations: Abdomen is soft.  Musculoskeletal:        General: Normal range of motion.  Skin:    General: Skin is warm and dry.  Neurological:     General: No focal deficit present.     Mental Status: He is alert. Mental status is at baseline.  Psychiatric:        Attention and Perception: Attention normal.        Mood and Affect: Mood is depressed. Affect is tearful.        Speech: Speech is delayed.        Behavior: Behavior is withdrawn.        Thought Content: Thought content normal.    Review of Systems  Constitutional: Negative.   HENT: Negative.    Eyes: Negative.   Respiratory: Negative.    Cardiovascular: Negative.   Gastrointestinal: Negative.   Musculoskeletal: Negative.   Skin: Negative.   Neurological: Negative.   Psychiatric/Behavioral:  Positive for depression and substance abuse. The patient is nervous/anxious.    Blood pressure 116/81, pulse 70, temperature 98.1 F (36.7 C), temperature source Oral, resp. rate 19, height 5\' 9"  (1.753 m), weight 68.5 kg, SpO2 99 %. Body mass index is 22.3 kg/m.   Treatment Plan Summary: Medication management and Plan I already have put his Lexapro up to 20 mg.  No change  to medicine.  Supportive counseling and encouragement.  Encouraged him and request that he attend groups anyway.  Still sad and hopeless and hoping for some kind of placement  6/22 Increase gabapentin to 600 mg p.o. 3 times daily  Reggie Pile, MD 03/03/2023, 7:42 AM

## 2023-03-03 NOTE — Group Note (Signed)
Date:  03/03/2023 Time:  11:55 PM  Group Topic/Focus:  Wrap-Up Group:   The focus of this group is to help patients review their daily goal of treatment and discuss progress on daily workbooks.  Group Topic/Focus:      Participation Level:  Active  Participation Quality:  Appropriate  Affect:  Appropriate  Cognitive:  Alert and Appropriate  Insight: Appropriate  Engagement in Group:  Engaged  Modes of Intervention:  Activity and Discussion  Additional Comments:  Rainey Pines 03/03/2023, 11:55 PM

## 2023-03-03 NOTE — Group Note (Signed)
LCSW Group Therapy Note  Group Date: 03/03/2023 Start Time: 1455 End Time: 1530   Type of Therapy and Topic:  Group Therapy - Healthy vs Unhealthy Coping Skills  Participation Level:  Active   Description of Group The focus of this group was to determine what unhealthy coping techniques typically are used by group members and what healthy coping techniques would be helpful in coping with various problems. Patients were guided in becoming aware of the differences between healthy and unhealthy coping techniques. Patients were asked to identify 2-3 healthy coping skills they would like to learn to use more effectively.  Therapeutic Goals Patients learned that coping is what human beings do all day long to deal with various situations in their lives Patients defined and discussed healthy vs unhealthy coping techniques Patients identified their preferred coping techniques and identified whether these were healthy or unhealthy Patients determined 2-3 healthy coping skills they would like to become more familiar with and use more often. Patients provided support and ideas to each other   Summary of Patient Progress:  The patient attended group. The patient stated that he spends his free time listening to music, watching videos and playing games. The patient stated that he did not have a go to coping skill that he uses. The patient did great with listening and got an understanding of good vs. Bad coping skills.     [name], LCSWA 03/03/2023  3:36 PM

## 2023-03-04 NOTE — Progress Notes (Signed)
[facility] MD Progress Note  03/04/2023 9:02 AM Joshua Lewis  MRN:  [id number]  Chief Complaint:  MDD (major depressive disorder) [F32.9] Principal Diagnosis: MDD (major depressive disorder) Diagnosis:  Principal Problem:   MDD (major depressive disorder)  Subjective:  6/23 Patient is seen in his bed curled up with a restricted affect, endorses missing his family.  Says that he called his wife yesterday who told him that she is not going to come back and he cannot come up to [place] "she does not want me back."  He also has no place to go.    Glad to have found his phone.  He is eating and sleeping well.  He is not isolating, states that he usually walks the halls in order to exercise.    No conflicts on the unit.  Yesterday, patient was considerably tearful and received oral B-52.  He harbors a great deal of guilt for his past behaviors.    Denies suicidal thoughts.  Homicidal thoughts.  Denies psychosis.  6/22 She was evaluated on February 18, 2023 due to homelessness suicidal ideations and worsening depression.  Also, experiencing auditory hallucinations.  Today, patient is calm and cooperative he is amicable.  Tells me that he has made positive progress since his admission.  Hallucinations are now low occasionally telling him to break out.  Did sleep well last night.  Reports having throbbing pain on his left forearm.  Patient did not attend any groups yesterday and looks despondent regarding staff nursing.  Several of her friends report [facility].  Denies safety concerns.  Patient laments that his family habits moved to [place] without him.  Says that they got tired of his depression but also states that he was being very irritable and angry with him because of it.  I was screaming calling them names and throwing stuff around.  Principal Problem: MDD (major depressive disorder) Diagnosis: Principal Problem:   MDD (major depressive  disorder) Active Problems:   Cocaine abuse with cocaine-induced mood disorder (HCC)  Total Time spent with patient: 30 minutes  Past Psychiatric History: Past history of longstanding substance abuse problems and depression  Past Medical History:  Past Medical History:  Diagnosis Date   Chronic back pain greater than 3 months duration    Kidney carcinoma [facility])     Past Surgical History:  Procedure Laterality Date   ESOPHAGOGASTRODUODENOSCOPY (EGD) WITH PROPOFOL Left 08/09/2020   Procedure: ESOPHAGOGASTRODUODENOSCOPY (EGD) WITH PROPOFOL;  Surgeon: Vida Rigger, MD;  Location: [facility] ENDOSCOPY;  Service: Endoscopy;  Laterality: Left;   HAND SURGERY     HERNIA REPAIR     NEPHRECTOMY     right   Family History:  Family History  Problem Relation Age of Onset   Cancer Mother    Diabetes Mother    Hypertension Mother    Family Psychiatric  History: See previous Social History:  Social History   Substance and Sexual Activity  Alcohol Use Yes   Alcohol/week: 10.0 standard drinks of alcohol   Types: 10 Cans of beer per week     Social History   Substance and Sexual Activity  Drug Use Yes   Frequency: 1.0 times per week   Types: Cocaine    Social History   Socioeconomic History   Marital status: Married    Spouse name: Not on file   Number of children: 2   Years of education: Not on file   Highest education level:  Not on file  Occupational History   Not on file  Tobacco Use   Smoking status: Some Days    Packs/day: 0.50    Years: 25.00    Additional pack years: 0.00    Total pack years: 12.50    Types: Cigarettes   Smokeless tobacco: Never  Vaping Use   Vaping Use: Never used  Substance and Sexual Activity   Alcohol use: Yes    Alcohol/week: 10.0 standard drinks of alcohol    Types: 10 Cans of beer per week   Drug use: Yes    Frequency: 1.0 times per week    Types: Cocaine   Sexual activity: Yes  Other Topics Concern   Not on file  Social History Narrative    Not on file   Social Determinants of Health   Financial Resource Strain: Not on file  Food Insecurity: Food Insecurity Present (02/18/2023)   Hunger Vital Sign    Worried About Running Out of Food in the Last Year: Sometimes true    Ran Out of Food in the Last Year: Sometimes true  Transportation Needs: Unmet Transportation Needs (02/18/2023)   PRAPARE - Administrator, Civil Service (Medical): Yes    Lack of Transportation (Non-Medical): Yes  Physical Activity: Not on file  Stress: Not on file  Social Connections: Not on file   Additional Social History:                         Sleep: Fair  Appetite:  Fair  Current Medications: Current Facility-Administered Medications  Medication Dose Route Frequency Provider Last Rate Last Admin   acetaminophen (TYLENOL) tablet 650 mg  650 mg Oral Q4H PRN Eligha Bridegroom, NP   650 mg at 02/22/23 1044   alum & mag hydroxide-simeth (MAALOX/MYLANTA) 200-200-20 MG/5ML suspension 30 mL  30 mL Oral Q6H PRN Eligha Bridegroom, NP       diphenhydrAMINE (BENADRYL) capsule 50 mg  50 mg Oral TID PRN Eligha Bridegroom, NP   50 mg at 03/03/23 2038   Or   diphenhydrAMINE (BENADRYL) injection 50 mg  50 mg Intramuscular TID PRN Eligha Bridegroom, NP       escitalopram (LEXAPRO) tablet 20 mg  20 mg Oral Daily Clapacs, John T, MD   20 mg at 03/04/23 0810   feeding supplement (ENSURE ENLIVE / ENSURE PLUS) liquid 237 mL  237 mL Oral BID BM Clapacs, John T, MD   237 mL at 03/03/23 1607   feeding supplement (ENSURE ENLIVE / ENSURE PLUS) liquid 237 mL  237 mL Oral TID BM Clapacs, John T, MD   237 mL at 03/04/23 0810   gabapentin (NEURONTIN) capsule 600 mg  600 mg Oral TID Reggie Pile, MD   600 mg at 03/04/23 0809   haloperidol (HALDOL) tablet 5 mg  5 mg Oral TID PRN Eligha Bridegroom, NP   5 mg at 03/03/23 2038   Or   haloperidol lactate (HALDOL) injection 5 mg  5 mg Intramuscular TID PRN Eligha Bridegroom, NP       hydrOXYzine (ATARAX) tablet 25  mg  25 mg Oral TID PRN Eligha Bridegroom, NP   25 mg at 02/28/23 2134   ibuprofen (ADVIL) tablet 600 mg  600 mg Oral Q6H PRN Clapacs, Jackquline Denmark, MD   600 mg at 02/24/23 1643   LORazepam (ATIVAN) tablet 2 mg  2 mg Oral TID PRN Eligha Bridegroom, NP   2 mg at 03/03/23 2038  Or   LORazepam (ATIVAN) injection 2 mg  2 mg Intramuscular TID PRN Eligha Bridegroom, NP       magnesium hydroxide (MILK OF MAGNESIA) suspension 30 mL  30 mL Oral Daily PRN Eligha Bridegroom, NP       nicotine (NICODERM CQ - dosed in mg/24 hours) patch 14 mg  14 mg Transdermal Daily Clapacs, Jackquline Denmark, MD   14 mg at 03/04/23 0810   nicotine polacrilex (NICORETTE) gum 2 mg  2 mg Oral PRN Clapacs, Jackquline Denmark, MD   2 mg at 03/03/23 1845   QUEtiapine (SEROQUEL XR) 24 hr tablet 100 mg  100 mg Oral QHS Clapacs, Jackquline Denmark, MD   100 mg at 02/27/23 2103    Lab Results: No results found for this or any previous visit (from the past 48 hour(s)).  Blood Alcohol level:  Lab Results  Component Value Date   ETH <10 02/16/2023   ETH <10 08/22/2021    Metabolic Disorder Labs: Lab Results  Component Value Date   HGBA1C 5.6 02/21/2023   MPG 114.02 02/21/2023   No results found for: "PROLACTIN" Lab Results  Component Value Date   CHOL 171 02/21/2023   TRIG 54 02/21/2023   HDL 53 02/21/2023   CHOLHDL 3.2 02/21/2023   VLDL 11 02/21/2023   LDLCALC 107 (H) 02/21/2023   LDLCALC 154 (H) 01/28/2020    Physical Findings: AIMS: Facial and Oral Movements Muscles of Facial Expression: None, normal Lips and Perioral Area: None, normal Jaw: None, normal Tongue: None, normal,Extremity Movements Upper (arms, wrists, hands, fingers): None, normal Lower (legs, knees, ankles, toes): None, normal, Trunk Movements Neck, shoulders, hips: None, normal, Overall Severity Severity of abnormal movements (highest score from questions above): None, normal Incapacitation due to abnormal movements: None, normal Patient's awareness of abnormal movements (rate only  patient's report): No Awareness, Dental Status Current problems with teeth and/or dentures?: No Does patient usually wear dentures?: No  CIWA:    COWS:     Musculoskeletal: Strength & Muscle Tone: within normal limits Gait & Station: normal Patient leans: N/A  Psychiatric Specialty Exam:  Presentation  General Appearance:  Appropriate for Environment; Casual; Neat  Eye Contact: Poor  Speech: Normal Rate  Speech Volume: Decreased  Handedness: Right   Mood and Affect  Mood: sad  Affect: Flat   Thought Process  Thought Processes: Coherent  Descriptions of Associations:Intact  Orientation:Full (Time, Place and Person)  Thought Content:Logical  History of Schizophrenia/Schizoaffective disorder:No data recorded Duration of Psychotic Symptoms:No data recorded Hallucinations:No data recorded Ideas of Reference:None  Suicidal Thoughts:No data recorded Homicidal Thoughts:No data recorded  Sensorium  Memory: Immediate Fair; Remote Good  Judgment: Fair  Insight: Fair   Art therapist  Concentration: Fair  Attention Span: Fair  Recall: [employer] of Knowledge: Fair  Language: Fair   Psychomotor Activity  Psychomotor Activity:No data recorded  Assets  Assets: Communication Skills; Desire for Improvement; Financial Resources/Insurance; Social Support   Sleep  Sleep:No data recorded   Physical Exam: Physical Exam Vitals and nursing note reviewed.  Constitutional:      Appearance: Normal appearance.  HENT:     Head: Normocephalic and atraumatic.     Mouth/Throat:     Pharynx: Oropharynx is clear.  Eyes:     Pupils: Pupils are equal, round, and reactive to light.  Cardiovascular:     Rate and Rhythm: Normal rate and regular rhythm.  Pulmonary:     Effort: Pulmonary effort is normal.  Breath sounds: Normal breath sounds.  Abdominal:     General: Abdomen is flat.     Palpations: Abdomen is soft.  Musculoskeletal:         General: Normal range of motion.  Skin:    General: Skin is warm and dry.  Neurological:     General: No focal deficit present.     Mental Status: He is alert. Mental status is at baseline.  Psychiatric:        Attention and Perception: Attention normal.        Mood and Affect: Mood is depressed. Affect is tearful.        Speech: Speech is delayed.        Behavior: Behavior is withdrawn.        Thought Content: Thought content normal.    Review of Systems  Constitutional: Negative.   HENT: Negative.    Eyes: Negative.   Respiratory: Negative.    Cardiovascular: Negative.   Gastrointestinal: Negative.   Musculoskeletal: Negative.   Skin: Negative.   Neurological: Negative.   Psychiatric/Behavioral:  Positive for depression and substance abuse. The patient is nervous/anxious.    Blood pressure 120/88, pulse 72, temperature 97.7 F (36.5 C), temperature source Oral, resp. rate 18, height 5\' 9"  (1.753 m), weight 68.5 kg, SpO2 99 %. Body mass index is 22.3 kg/m.   Treatment Plan Summary: Medication management and Plan I already have put his Lexapro up to 20 mg.  No change to medicine.  Supportive counseling and encouragement.  Encouraged him and request that he attend groups anyway.  Still sad and hopeless and hoping for some kind of placement  6/23 SW to assist with placement if possible  6/22 Increase gabapentin to 600 mg p.o. 3 times daily  Reggie Pile, MD 03/04/2023, 9:02 AM

## 2023-03-04 NOTE — Plan of Care (Signed)
Pt denies SI / HI  / AVH. Pt is isolative to room. Pt is minimal during assessment. Pt reports anxiety and requests PRN today. PRN is effective. No sign of distress or injury. Staff will continue to monitor q 15 for safety.    Problem: Education: Goal: Knowledge of General Education information will improve Description: Including pain rating scale, medication(s)/side effects and non-pharmacologic comfort measures Outcome: Not Progressing   Problem: Health Behavior/Discharge Planning: Goal: Ability to manage health-related needs will improve Outcome: Not Progressing   Problem: Clinical Measurements: Goal: Ability to maintain clinical measurements within normal limits will improve Outcome: Not Progressing

## 2023-03-04 NOTE — Group Note (Signed)
Date:  03/04/2023 Time:  9:30 PM  Group Topic/Focus:  Rediscovering Joy:   The focus of this group is to explore various ways to relieve stress in a positive manner.    Participation Level:  Active  Participation Quality:  Appropriate  Affect:  Appropriate  Cognitive:  Alert and Appropriate  Insight: Appropriate  Engagement in Group:  Developing/Improving  Modes of Intervention:  Socialization  Additional Comments:     Joshua Lewis 03/04/2023, 9:30 PM

## 2023-03-04 NOTE — Group Note (Signed)
Date:  03/04/2023 Time:  11:50 AM  Group Topic/Focus:  Activity Group   Intention of this group is to encourage patients to come outside to the courtyard and get some fresh air and some exercise.     Participation Level:  Did Not Attend   Joshua Lewis 03/04/2023, 11:50 AM  

## 2023-03-04 NOTE — Progress Notes (Signed)
Patient was noted restless, crying and sad, he expressed he his wife has left him after 29 years of marriage. Patient was offered emotional support and encouragement and given medication for anxiety ans restlessness. Patient currently denies SI/HI/AVH interacting appropriately with peers and staff. 15 minutes safety checks maintained will continue to monitor.

## 2023-03-05 ENCOUNTER — Other Ambulatory Visit: Payer: Self-pay

## 2023-03-05 DIAGNOSIS — F332 Major depressive disorder, recurrent severe without psychotic features: Secondary | ICD-10-CM | POA: Diagnosis not present

## 2023-03-05 MED ORDER — GABAPENTIN 300 MG PO CAPS
600.0000 mg | ORAL_CAPSULE | Freq: Three times a day (TID) | ORAL | 0 refills | Status: AC
  Filled 2023-03-05: qty 180, 30d supply, fill #0

## 2023-03-05 MED ORDER — ESCITALOPRAM OXALATE 20 MG PO TABS
20.0000 mg | ORAL_TABLET | Freq: Every day | ORAL | 0 refills | Status: AC
  Filled 2023-03-05: qty 30, 30d supply, fill #0

## 2023-03-05 MED ORDER — QUETIAPINE FUMARATE ER 50 MG PO TB24
100.0000 mg | ORAL_TABLET | Freq: Every day | ORAL | 0 refills | Status: AC
  Filled 2023-03-05: qty 60, 60d supply, fill #0

## 2023-03-05 NOTE — Group Note (Signed)
Recreation Therapy Group Note   Group Topic:Coping Skills  Group Date: 03/05/2023 Start Time: 1000 End Time: 1045 Facilitators: Rosina Lowenstein, LRT, CTRS Location:  Craft Room  Group Description: Mind Map.  Patient was provided a blank template of a diagram with 32 blank boxes in a tiered system, branching from the center (similar to a bubble chart). LRT directed patients to label the middle of the diagram "Coping Skills". LRT and patients then came up with 8 different coping skills as examples. Pt were directed to record their coping skills in the 2nd tier boxes closest to the center.  Patients would then share their coping skills with the group as LRT wrote them out. LRT gave a handout of 100 different coping skills at the end of group.   Goal Area(s) Addressed: Patients will be able to define "coping skills". Patient will identify new coping skills.  Patient will identify new possible leisure interests.   Affect/Mood: N/A   Participation Level: Did not attend    Clinical Observations/Individualized Feedback: Patient did not attend group.  Plan: Continue to engage patient in RT group sessions 2-3x/week.   Rosina Lowenstein, LRT, CTRS 03/05/2023 11:20 AM

## 2023-03-05 NOTE — Progress Notes (Signed)
Patient alert and oriented x 4, affect is blunted but appears less anxious, he was visible in unit interacting appropriately with peers and staff, he denies SI.HI.AVH he rated depression a 6/10 ( 0 low - high 10 ) he was offered emotional support and encouragement. Patient currently denies SI/HI/AVH 15 ,minutes safety checks maintained will continue to monitor.

## 2023-03-05 NOTE — Progress Notes (Signed)
Patient discharged with all belongings and medications from outpatient pharmacy. Pt verbalized understanding of medication administration instructions and follow up appointments. Denies SI/HI/AVH on discharge. Contracts for safety. Discharged to [facility] via safe transport.

## 2023-03-05 NOTE — Progress Notes (Signed)
[facility] MD Progress Note  03/05/2023 8:11 AM Joshua Lewis  MRN:  [id number]  Chief Complaint:  MDD (major depressive disorder) [F32.9] Principal Diagnosis: MDD (major depressive disorder) Diagnosis:  Principal Problem:   MDD (major depressive disorder)  Subjective:  6/24 Patient reports that he felt agitated after his wife told him that she took his kids.  He started pacing.  Received as needed Ativan yesterday which was helpful.  He inquires about this medication.  Denies suicidal homicidal ideations.  No psychosis or racing thoughts.  He eating well.  Socializing well.  Per nursing report, patient is manipulative abides conflicting reports.  They indicate that he was flirting with other females on the unit.  6/23 Patient is seen in his bed curled up with a restricted affect, endorses missing his family.  Says that he called his wife yesterday who told him that she is not going to come back and he cannot come up to [place] "she does not want me back."  He also has no place to go.    Glad to have found his phone.  He is eating and sleeping well.  He is not isolating, states that he usually walks the halls in order to exercise.    No conflicts on the unit.  Yesterday, patient was considerably tearful and received oral B-52.  He harbors a great deal of guilt for his past behaviors.    Denies suicidal thoughts.  Homicidal thoughts.  Denies psychosis.  6/22 She was evaluated on February 18, 2023 due to homelessness suicidal ideations and worsening depression.  Also, experiencing auditory hallucinations.  Today, patient is calm and cooperative he is amicable.  Tells me that he has made positive progress since his admission.  Hallucinations are now low occasionally telling him to break out.  Did sleep well last night.  Reports having throbbing pain on his left forearm.  Patient did not attend any groups yesterday and looks despondent regarding staff nursing.  Several of her friends report that he  [facility].  Denies safety concerns.  Patient laments that his family habits moved to [place] without him.  Says that they got tired of his depression but also states that he was being very irritable and angry with him because of it.  I was screaming calling them names and throwing stuff around.  Principal Problem: MDD (major depressive disorder) Diagnosis: Principal Problem:   MDD (major depressive disorder) Active Problems:   Cocaine abuse with cocaine-induced mood disorder (HCC)  Total Time spent with patient: 30 minutes  Past Psychiatric History: Past history of longstanding substance abuse problems and depression  Past Medical History:  Past Medical History:  Diagnosis Date   Chronic back pain greater than 3 months duration    Kidney carcinoma [facility])     Past Surgical History:  Procedure Laterality Date   ESOPHAGOGASTRODUODENOSCOPY (EGD) WITH PROPOFOL Left 08/09/2020   Procedure: ESOPHAGOGASTRODUODENOSCOPY (EGD) WITH PROPOFOL;  Surgeon: Vida Rigger, MD;  Location: [facility] ENDOSCOPY;  Service: Endoscopy;  Laterality: Left;   HAND SURGERY     HERNIA REPAIR     NEPHRECTOMY     right   Family History:  Family History  Problem Relation Age of Onset   Cancer Mother    Diabetes Mother    Hypertension Mother    Family Psychiatric  History: See previous Social History:  Social History   Substance and Sexual Activity  Alcohol Use Yes   Alcohol/week: 10.0 standard drinks of  alcohol   Types: 10 Cans of beer per week     Social History   Substance and Sexual Activity  Drug Use Yes   Frequency: 1.0 times per week   Types: Cocaine    Social History   Socioeconomic History   Marital status: Married    Spouse name: Not on file   Number of children: 2   Years of education: Not on file   Highest education level: Not on file  Occupational History   Not on file  Tobacco Use   Smoking status: Some Days    Packs/day: 0.50    Years:  25.00    Additional pack years: 0.00    Total pack years: 12.50    Types: Cigarettes   Smokeless tobacco: Never  Vaping Use   Vaping Use: Never used  Substance and Sexual Activity   Alcohol use: Yes    Alcohol/week: 10.0 standard drinks of alcohol    Types: 10 Cans of beer per week   Drug use: Yes    Frequency: 1.0 times per week    Types: Cocaine   Sexual activity: Yes  Other Topics Concern   Not on file  Social History Narrative   Not on file   Social Determinants of Health   Financial Resource Strain: Not on file  Food Insecurity: Food Insecurity Present (02/18/2023)   Hunger Vital Sign    Worried About Running Out of Food in the Last Year: Sometimes true    Ran Out of Food in the Last Year: Sometimes true  Transportation Needs: Unmet Transportation Needs (02/18/2023)   PRAPARE - Administrator, Civil Service (Medical): Yes    Lack of Transportation (Non-Medical): Yes  Physical Activity: Not on file  Stress: Not on file  Social Connections: Not on file   Additional Social History:                         Sleep: Fair  Appetite:  Fair  Current Medications: Current Facility-Administered Medications  Medication Dose Route Frequency Provider Last Rate Last Admin   acetaminophen (TYLENOL) tablet 650 mg  650 mg Oral Q4H PRN Eligha Bridegroom, NP   650 mg at 02/22/23 1044   alum & mag hydroxide-simeth (MAALOX/MYLANTA) 200-200-20 MG/5ML suspension 30 mL  30 mL Oral Q6H PRN Eligha Bridegroom, NP       diphenhydrAMINE (BENADRYL) capsule 50 mg  50 mg Oral TID PRN Eligha Bridegroom, NP   50 mg at 03/03/23 2038   Or   diphenhydrAMINE (BENADRYL) injection 50 mg  50 mg Intramuscular TID PRN Eligha Bridegroom, NP       escitalopram (LEXAPRO) tablet 20 mg  20 mg Oral Daily Clapacs, John T, MD   20 mg at 03/04/23 0810   feeding supplement (ENSURE ENLIVE / ENSURE PLUS) liquid 237 mL  237 mL Oral BID BM Clapacs, John T, MD   237 mL at 03/03/23 1607   feeding supplement  (ENSURE ENLIVE / ENSURE PLUS) liquid 237 mL  237 mL Oral TID BM Clapacs, John T, MD   237 mL at 03/04/23 2100   gabapentin (NEURONTIN) capsule 600 mg  600 mg Oral TID Reggie Pile, MD   600 mg at 03/04/23 1643   haloperidol (HALDOL) tablet 5 mg  5 mg Oral TID PRN Eligha Bridegroom, NP   5 mg at 03/03/23 2038   Or   haloperidol lactate (HALDOL) injection 5 mg  5 mg Intramuscular TID PRN  Eligha Bridegroom, NP       hydrOXYzine (ATARAX) tablet 25 mg  25 mg Oral TID PRN Eligha Bridegroom, NP   25 mg at 02/28/23 2134   ibuprofen (ADVIL) tablet 600 mg  600 mg Oral Q6H PRN Clapacs, Jackquline Denmark, MD   600 mg at 02/24/23 1643   LORazepam (ATIVAN) tablet 2 mg  2 mg Oral TID PRN Eligha Bridegroom, NP   2 mg at 03/04/23 1804   Or   LORazepam (ATIVAN) injection 2 mg  2 mg Intramuscular TID PRN Eligha Bridegroom, NP       magnesium hydroxide (MILK OF MAGNESIA) suspension 30 mL  30 mL Oral Daily PRN Eligha Bridegroom, NP       nicotine (NICODERM CQ - dosed in mg/24 hours) patch 14 mg  14 mg Transdermal Daily Clapacs, Jackquline Denmark, MD   14 mg at 03/04/23 0981   nicotine polacrilex (NICORETTE) gum 2 mg  2 mg Oral PRN Clapacs, Jackquline Denmark, MD   2 mg at 03/03/23 1845   QUEtiapine (SEROQUEL XR) 24 hr tablet 100 mg  100 mg Oral QHS Clapacs, Jackquline Denmark, MD   100 mg at 03/04/23 2141    Lab Results: No results found for this or any previous visit (from the past 48 hour(s)).  Blood Alcohol level:  Lab Results  Component Value Date   ETH <10 02/16/2023   ETH <10 08/22/2021    Metabolic Disorder Labs: Lab Results  Component Value Date   HGBA1C 5.6 02/21/2023   MPG 114.02 02/21/2023   No results found for: "PROLACTIN" Lab Results  Component Value Date   CHOL 171 02/21/2023   TRIG 54 02/21/2023   HDL 53 02/21/2023   CHOLHDL 3.2 02/21/2023   VLDL 11 02/21/2023   LDLCALC 107 (H) 02/21/2023   LDLCALC 154 (H) 01/28/2020    Physical Findings: AIMS: Facial and Oral Movements Muscles of Facial Expression: None, normal Lips and  Perioral Area: None, normal Jaw: None, normal Tongue: None, normal,Extremity Movements Upper (arms, wrists, hands, fingers): None, normal Lower (legs, knees, ankles, toes): None, normal, Trunk Movements Neck, shoulders, hips: None, normal, Overall Severity Severity of abnormal movements (highest score from questions above): None, normal Incapacitation due to abnormal movements: None, normal Patient's awareness of abnormal movements (rate only patient's report): No Awareness, Dental Status Current problems with teeth and/or dentures?: No Does patient usually wear dentures?: No  CIWA:    COWS:     Musculoskeletal: Strength & Muscle Tone: within normal limits Gait & Station: normal Patient leans: N/A  Psychiatric Specialty Exam:  Presentation  General Appearance:  Appropriate for Environment; Casual; Neat  Eye Contact: Poor  Speech: Normal Rate  Speech Volume: Decreased  Handedness: Right   Mood and Affect  Mood: better  Affect: neutral   Thought Process  Thought Processes: Coherent  Descriptions of Associations:Intact  Orientation:Full (Time, Place and Person)  Thought Content:Logical  History of Schizophrenia/Schizoaffective disorder:No data recorded Duration of Psychotic Symptoms:No data recorded Hallucinations:No data recorded Ideas of Reference:None  Suicidal Thoughts:No data recorded Homicidal Thoughts:No data recorded  Sensorium  Memory: Immediate Fair; Remote Good  Judgment: Good  Insight: Fair   Art therapist  Concentration: Fair  Attention Span: Fair  Recall: [employer] of Knowledge: Fair  Language: Fair   Psychomotor Activity  Psychomotor Activity:No data recorded  Assets  Assets: Communication Skills; Desire for Improvement; Financial Resources/Insurance; Social Support   Sleep  Sleep:No data recorded   Physical Exam: Physical Exam Vitals and nursing  note reviewed.  Constitutional:       Appearance: Normal appearance.  HENT:     Head: Normocephalic and atraumatic.     Mouth/Throat:     Pharynx: Oropharynx is clear.  Eyes:     Pupils: Pupils are equal, round, and reactive to light.  Cardiovascular:     Rate and Rhythm: Normal rate and regular rhythm.  Pulmonary:     Effort: Pulmonary effort is normal.     Breath sounds: Normal breath sounds.  Abdominal:     General: Abdomen is flat.     Palpations: Abdomen is soft.  Musculoskeletal:        General: Normal range of motion.  Skin:    General: Skin is warm and dry.  Neurological:     General: No focal deficit present.     Mental Status: He is alert. Mental status is at baseline.  Psychiatric:        Attention and Perception: Attention normal.        Mood and Affect: Mood is depressed. Affect is tearful.        Speech: Speech is delayed.        Behavior: Behavior is withdrawn.        Thought Content: Thought content normal.    Review of Systems  Constitutional: Negative.   HENT: Negative.    Eyes: Negative.   Respiratory: Negative.    Cardiovascular: Negative.   Gastrointestinal: Negative.   Musculoskeletal: Negative.   Skin: Negative.   Neurological: Negative.   Psychiatric/Behavioral:  Positive for depression and substance abuse. The patient is nervous/anxious.    Blood pressure 119/83, pulse 71, temperature 98 F (36.7 C), temperature source Oral, resp. rate 18, height 5\' 9"  (1.753 m), weight 68.5 kg, SpO2 99 %. Body mass index is 22.3 kg/m.   Treatment Plan Summary: Medication management and Plan I already have put his Lexapro up to 20 mg.  No change to medicine.  Supportive counseling and encouragement.  Encouraged him and request that he attend groups anyway.  Still sad and hopeless and hoping for some kind of placement  6/24 Discharge today or tomorrow  6/23 SW to assist with placement if possible  6/22 Increase gabapentin to 600 mg p.o. 3 times daily  Reggie Pile, MD 03/05/2023, 8:11  AM

## 2023-03-05 NOTE — Progress Notes (Signed)
D: Patient alert and oriented, able to make needs known. Denies SI/HI, AVH at present. Denies pain at present. Patient did not list a goal for today.  Rates depression 9/10, hopelessness 9/10, and anxiety 9/10 per self inventory report sheet. Patient reports energy level as normal. He reports he slept well last night. Patient does not request any PRN medication at this time but did request Ativan earlier for anxiety/agitation.  A: Scheduled medications administered to patient per MD order. Support and encouragement provided. Routine safety checks conducted every fifteen minutes. Patient informed to notify staff with problems or concerns. Frequent verbal contact made.   R: No adverse drug reactions noted. Patient contracts for safety at this time. Patient is compliant with medications and treatment plan. Patient receptive, calm and cooperative. Patient interacts with others appropriately on unit at present. Patient remains safe at present.   Patient is scheduled to discharge today and is not happy about it. He was heard verbalizing to other peers, "I wanted to stay here until I get my check this week, but it seems like there is another agenda."   Went over all discharge instructions with patient and medication lists. Pt verbalized understanding and verbalized safety plan. Suicide safety plan discussed with pt, and survey given to pt.

## 2023-03-05 NOTE — Plan of Care (Signed)
  Problem: Education: Goal: Knowledge of General Education information will improve Description: Including pain rating scale, medication(s)/side effects and non-pharmacologic comfort measures 03/05/2023 1136 by Harless Litten, RN Outcome: Adequate for Discharge 03/05/2023 0840 by Harless Litten, RN Outcome: Progressing   Problem: Health Behavior/Discharge Planning: Goal: Ability to manage health-related needs will improve 03/05/2023 1136 by Harless Litten, RN Outcome: Adequate for Discharge 03/05/2023 0840 by Harless Litten, RN Outcome: Progressing   Problem: Clinical Measurements: Goal: Ability to maintain clinical measurements within normal limits will improve 03/05/2023 1136 by Harless Litten, RN Outcome: Adequate for Discharge 03/05/2023 0840 by Harless Litten, RN Outcome: Progressing Goal: Will remain free from infection 03/05/2023 1136 by Harless Litten, RN Outcome: Adequate for Discharge 03/05/2023 0840 by Harless Litten, RN Outcome: Progressing Goal: Diagnostic test results will improve Outcome: Adequate for Discharge Goal: Respiratory complications will improve Outcome: Adequate for Discharge Goal: Cardiovascular complication will be avoided Outcome: Adequate for Discharge   Problem: Activity: Goal: Risk for activity intolerance will decrease Outcome: Adequate for Discharge   Problem: Nutrition: Goal: Adequate nutrition will be maintained Outcome: Adequate for Discharge   Problem: Coping: Goal: Level of anxiety will decrease Outcome: Adequate for Discharge   Problem: Elimination: Goal: Will not experience complications related to bowel motility Outcome: Adequate for Discharge Goal: Will not experience complications related to urinary retention Outcome: Adequate for Discharge   Problem: Pain Managment: Goal: General experience of comfort will improve Outcome: Adequate for Discharge   Problem: Safety: Goal: Ability to remain free from injury will  improve Outcome: Adequate for Discharge   Problem: Skin Integrity: Goal: Risk for impaired skin integrity will decrease Outcome: Adequate for Discharge

## 2023-03-05 NOTE — Plan of Care (Signed)

## 2023-03-05 NOTE — Group Note (Signed)
Date:  03/05/2023 Time:  10:11 AM  Group Topic/Focus:  Goals Group:   The focus of this group is to help patients establish daily goals to achieve during treatment and discuss how the patient can incorporate goal setting into their daily lives to aide in recovery.    Participation Level:  Did Not Attend   Lynelle [facility] 03/05/2023, 10:11 AM

## 2023-03-05 NOTE — [facility] Suicide Risk Assessment (Addendum)
[facility] Discharge Suicide Risk Assessment   Principal Problem: MDD (major depressive disorder) Discharge Diagnoses: Principal Problem:   MDD (major depressive disorder) Active Problems:   Cocaine abuse with cocaine-induced mood disorder [facility])  Hospital course: During patient's admission, he was started on Seroquel XR 100 mg. Gabapentin was increased to 600 mg p.o. 3 times daily. Has admitted to feeling better with no suicidal ideations. Staff are concerned about possible malingering and manipulative behaviors while being here. Certainly occasional depression could be a part of this especially due to his wife moving away to [place]. If the patient will suicidal homicidal please call 911 or go to the local emergency department. Patient will be discharged to a homeless shelter or he will seek [facility] services. He is not in any psychiatric medical distress. Alert and oriented x 3 and does have capacity make his own decisions.   Total Time spent with patient: 30 minutes  Musculoskeletal: Strength & Muscle Tone: within normal limits Gait & Station: normal Patient leans: Right  Psychiatric Specialty Exam  Presentation  General Appearance:  Appropriate for Environment; Casual; Neat  Eye Contact: Poor  Speech: Normal Rate  Speech Volume: Decreased  Handedness: Right   Mood and Affect  Mood: Anxious; Depressed  Duration of Depression Symptoms: No data recorded Affect: Flat   Thought Process  Thought Processes: Coherent  Descriptions of Associations:Intact  Orientation:Full (Time, Place and Person)  Thought Content:Logical  History of Schizophrenia/Schizoaffective disorder:No data recorded Duration of Psychotic Symptoms:No data recorded Hallucinations:No data recorded Ideas of Reference:None  Suicidal Thoughts:No data recorded Homicidal Thoughts:No data recorded  Sensorium  Memory: Immediate Fair; Remote Good  Judgment: Fair  Insight: Fair   Art therapist   Concentration: Fair  Attention Span: Fair  Recall: [employer] of Knowledge: Fair  Language: Fair   Psychomotor Activity  Psychomotor Activity:No data recorded  Assets  Assets: Communication Skills; Desire for Improvement; Financial Resources/Insurance; Social Support   Sleep  Sleep:No data recorded  Physical Exam: Physical Exam ROS Blood pressure 119/83, pulse 71, temperature 98 F (36.7 C), temperature source Oral, resp. rate 18, height 5\' 9"  (1.753 m), weight 68.5 kg, SpO2 99 %. Body mass index is 22.3 kg/m.  Mental Status Per Nursing Assessment::   On Admission:  NA  Demographic Factors:  Unemployed  Loss Factors: Loss of significant relationship  Historical Factors: NA  Risk Reduction Factors:   Positive social support  Continued Clinical Symptoms:  None  Cognitive Features That Contribute To Risk:  None    Suicide Risk:  Minimal: No identifiable suicidal ideation.  Patients presenting with no risk factors but with morbid ruminations; may be classified as minimal risk based on the severity of the depressive symptoms    Plan Of Care/Follow-up recommendations:  Activity:  As tolerated  Reggie Pile, MD 03/05/2023, 11:13 AM

## 2023-03-05 NOTE — Progress Notes (Signed)
  [facility] Adult Case Management Discharge Plan :  Will you be returning to the same living situation after discharge:  No. At discharge, do you have transportation home?: Yes,  CSW to assist with transportation needs Do you have the ability to pay for your medications: Yes,  [employer] / [employer]  Release of information consent forms completed and in the chart;  Patient's signature needed at discharge.  Patient to Follow up at:  Follow-up Information     [facility] Follow up.   Why: Please follow up with the [facility]. Contact information: [address] [facility] [place] [address] [phone]         [facility], [facility] Follow up.   Why: Walk in hours are from 8AM to [facility] Monday through Friday. Contact information: [address] [place] [address] [phone]                 Next level of care provider has access to [facility] Link:no  Safety Planning and Suicide Prevention discussed: Yes,  SPE completed with the patient.  Collateral contact were not successful.     Has patient been referred to the Quitline?: Patient refused referral for treatment  Patient has been referred for addiction treatment: Patient refused referral for treatment; referral information given to patient at discharge.  Harden Mo, LCSW 03/05/2023, 11:31 AM

## 2023-03-05 NOTE — Discharge Summary (Signed)
Physician Discharge Summary Note  Patient:  Joshua Lewis is an 62 y.o., male MRN:  [id number] DOB:  1961-06-30 Patient phone:  [phone] (home)  Patient address:   [address] [place] [address],  Total Time spent with patient: 22 minutes  Date of Admission:  02/17/2023 Date of Discharge: 03/05/2023 Hospital course: During patient's admission, he was started on Seroquel XR 100 mg.  Gabapentin was increased to 600 mg p.o. 3 times daily.  Has admitted to feeling better with no suicidal ideations.  Staff are concerned about possible malingering and manipulative behaviors while being here.  Certainly occasional depression could be a part of this especially due to his wife moving away to [place].  If the patient will suicidal homicidal please call 911 or go to the local emergency department.  Patient will be discharged to a homeless shelter or he will seek [facility].  He is not in any psychiatric medical distress.  Alert and oriented x 3 and does have capacity make his own decisions.  H&P Joshua Lewis is a 62 year old male, experiencing homelessness, with a history of depression, opioid and stimulant use disorders, chronic back pain, kidney carcinoma who presents to the ED with suicidal ideation. Chart reviewed including notes and results. UDS positive for cocaine and THC. Patient tearful in triage stating he is tried of hurting himself. Pt raises his shirt and shows me multiple healed lacerations to abdomen where he a few nights ago cut himself several times with a razor blade. Patient's current plan for suicide is to ask the police to shoot him. Prescribed medication for depression through the [place] but does not take it. States his family has left him "because they think I'm crazy."  On initial evaluation through [facility],  patient noted to be calm, cooperative, depressed, linear, endorsing auditory hallucinations, not appearing internally preoccupied, not responding to internal  stimuli. Patient reports he came to the ED due to feeling suicidal and experiencing auditory hallucinations. He reports his family dropped him off to get help. And states his family does not want to see him anymore because he has been trying to hurt himself. Patient reports he has been engaging in non-suicidal self injury by cutting. Patient endorses suicidal ideation with intent and plan to get shot by cops. Patient endorses command auditory hallucinations to kill himself. And reports he has been kicking holes in the wall "because people outside are telling me to." Reports he began to experience auditory hallucinations several days ago. Patient endorses depressed mood, insomnia, fatigue, anhedonia, poor concentration, hopelessness, worthlessness. Denies symptoms consistent with mania/hypomania, paranoia, visual hallucinations, ideas of reference, homicidal ideation.Patient endorses cocaine use 2 times per week, last use yesterday. Denies the use of marijuana, opioids, hallucinogens. Denies use of alcohol. Patient reports he stopped taking psychiatric medication 1 year ago because he did not like how he felt on medication. Does not recall what medication he was taking. Patient unable to engage in safety planning at the time of evaluation. Reports he is hopeless and will attempt to kill himself "any way" if he leaves the hospital.  Patient has been calm, compliant with his meds and slept well last night. He presents now in a moderate to severely depressed mood, has a flat affect and poor eye contact. He is displaying psychomotor retardation and anxiety. He reports staying in a motel with his family but ran out of funds and now he is homeless, while his family is staying with some friends. He has  a long H/O depression and substance abuse and has been prescribed meds before but he has not been compliant. He has been getting increasingly despondent over the last few weeks, started feeling suicidal 2 days ago and  reached out for help at [facility]. He has made some superficial scratches on his abdomen and reports 1 past H/O suicide attempt by cutting. He is denying active SI at this time, is denying any HI or AVH. He has been using cocaine occasionally and had AH while using it. He reports feelings of hopelessness, anhedonia, poor energy and guilt. He reports being from [place] area and his family will leave to return there in 10 days if he cannot get himself together. He is agreeable to starting Lexapro and get help he needs to get better.   Principal Problem: MDD (major depressive disorder) Discharge Diagnoses: Principal Problem:   MDD (major depressive disorder) Active Problems:   Cocaine abuse with cocaine-induced mood disorder (HCC)    Past Medical History:  Past Medical History:  Diagnosis Date   Chronic back pain greater than 3 months duration    Kidney carcinoma [facility])     Past Surgical History:  Procedure Laterality Date   ESOPHAGOGASTRODUODENOSCOPY (EGD) WITH PROPOFOL Left 08/09/2020   Procedure: ESOPHAGOGASTRODUODENOSCOPY (EGD) WITH PROPOFOL;  Surgeon: Vida Rigger, MD;  Location: [facility] ENDOSCOPY;  Service: Endoscopy;  Laterality: Left;   HAND SURGERY     HERNIA REPAIR     NEPHRECTOMY     right   Family History:  Family History  Problem Relation Age of Onset   Cancer Mother    Diabetes Mother    Hypertension Mother    Social History:  Social History   Substance and Sexual Activity  Alcohol Use Yes   Alcohol/week: 10.0 standard drinks of alcohol   Types: 10 Cans of beer per week     Social History   Substance and Sexual Activity  Drug Use Yes   Frequency: 1.0 times per week   Types: Cocaine    Social History   Socioeconomic History   Marital status: Married    Spouse name: Not on file   Number of children: 2   Years of education: Not on file   Highest education level: Not on file  Occupational History   Not on file  Tobacco Use   Smoking status: Some Days     Packs/day: 0.50    Years: 25.00    Additional pack years: 0.00    Total pack years: 12.50    Types: Cigarettes   Smokeless tobacco: Never  Vaping Use   Vaping Use: Never used  Substance and Sexual Activity   Alcohol use: Yes    Alcohol/week: 10.0 standard drinks of alcohol    Types: 10 Cans of beer per week   Drug use: Yes    Frequency: 1.0 times per week    Types: Cocaine   Sexual activity: Yes  Other Topics Concern   Not on file  Social History Narrative   Not on file   Social Determinants of Health   Financial Resource Strain: Not on file  Food Insecurity: Food Insecurity Present (02/18/2023)   Hunger Vital Sign    Worried About Running Out of Food in the Last Year: Sometimes true    Ran Out of Food in the Last Year: Sometimes true  Transportation Needs: Unmet Transportation Needs (02/18/2023)   PRAPARE - Administrator, Civil Service (Medical): Yes    Lack of Transportation (Non-Medical): Yes  Physical Activity: Not on file  Stress: Not on file  Social Connections: Not on file    Physical Findings: AIMS: Facial and Oral Movements Muscles of Facial Expression: None, normal Lips and Perioral Area: None, normal Jaw: None, normal Tongue: None, normal,Extremity Movements Upper (arms, wrists, hands, fingers): None, normal Lower (legs, knees, ankles, toes): None, normal, Trunk Movements Neck, shoulders, hips: None, normal, Overall Severity Severity of abnormal movements (highest score from questions above): None, normal Incapacitation due to abnormal movements: None, normal Patient's awareness of abnormal movements (rate only patient's report): No Awareness, Dental Status Current problems with teeth and/or dentures?: No Does patient usually wear dentures?: No  CIWA:    COWS:     Musculoskeletal: Strength & Muscle Tone: within normal limits Gait & Station: normal Patient leans: Right   Psychiatric Specialty Exam:  Presentation  General Appearance:   Appropriate for Environment; Casual; Neat  Eye Contact: Poor  Speech: Normal Rate  Speech Volume: Decreased  Handedness: Right   Mood and Affect  Mood: Anxious; Depressed  Affect: Flat   Thought Process  Thought Processes: Coherent  Descriptions of Associations:Intact  Orientation:Full (Time, Place and Person)  Thought Content:Logical  History of Schizophrenia/Schizoaffective disorder:No data recorded Duration of Psychotic Symptoms:No data recorded Hallucinations:No data recorded Ideas of Reference:None  Suicidal Thoughts:No data recorded Homicidal Thoughts:No data recorded  Sensorium  Memory: Immediate Fair; Remote Good  Judgment: Fair  Insight: Fair   Art therapist  Concentration: Fair  Attention Span: Fair  Recall: [employer] of Knowledge: Fair  Language: Fair   Psychomotor Activity  Psychomotor Activity:No data recorded  Assets  Assets: Communication Skills; Desire for Improvement; Financial Resources/Insurance; Social Support   Sleep  Sleep:No data recorded   Physical Exam: Physical Exam ROS Blood pressure 119/83, pulse 71, temperature 98 F (36.7 C), temperature source Oral, resp. rate 18, height 5\' 9"  (1.753 m), weight 68.5 kg, SpO2 99 %. Body mass index is 22.3 kg/m.   Social History   Tobacco Use  Smoking Status Some Days   Packs/day: 0.50   Years: 25.00   Additional pack years: 0.00   Total pack years: 12.50   Types: Cigarettes  Smokeless Tobacco Never   Tobacco Cessation:  A prescription for an FDA-approved tobacco cessation medication provided at discharge and A prescription for an FDA-approved tobacco cessation medication was offered at discharge and the patient refused   Blood Alcohol level:  Lab Results  Component Value Date   [facility] <10 02/16/2023   ETH <10 08/22/2021    Metabolic Disorder Labs:  Lab Results  Component Value Date   HGBA1C 5.6 02/21/2023   MPG 114.02 02/21/2023   No  results found for: "PROLACTIN" Lab Results  Component Value Date   CHOL 171 02/21/2023   TRIG 54 02/21/2023   HDL 53 02/21/2023   CHOLHDL 3.2 02/21/2023   VLDL 11 02/21/2023   LDLCALC 107 (H) 02/21/2023   LDLCALC 154 (H) 01/28/2020    See Psychiatric Specialty Exam and Suicide Risk Assessment completed by Attending Physician prior to discharge.  Discharge destination:  Home  Is patient on multiple antipsychotic therapies at discharge:  No   Has Patient had three or more failed trials of antipsychotic monotherapy by history:  No  Recommended Plan for Multiple Antipsychotic Therapies: NA   Allergies as of 03/05/2023   No Known Allergies      Medication List     TAKE these medications      Indication  escitalopram 20 MG tablet  Commonly known as: LEXAPRO Take 1 tablet (20 mg total) by mouth daily. Start taking on: March 06, 2023  Indication: Major Depressive Disorder   gabapentin 300 MG capsule Commonly known as: NEURONTIN Take 2 capsules (600 mg total) by mouth 3 (three) times daily.  Indication: Restless Leg Syndrome   QUEtiapine 50 MG Tb24 24 hr tablet Commonly known as: SEROQUEL XR Take 2 tablets (100 mg total) by mouth at bedtime.  Indication: Trouble Sleeping         Follow-up recommendations:  Diet:  Regular  Signed: Reggie Pile, MD 03/05/2023, 11:10 AM
